# Patient Record
Sex: Female | Born: 1985 | Race: White | Hispanic: No | Marital: Single | Smoking: Current every day smoker
Health system: Southern US, Community
[De-identification: ages and names within clinical notes are randomized; demographics above are authoritative.]

## PROBLEM LIST (undated history)

## (undated) ENCOUNTER — Emergency Department (HOSPITAL_COMMUNITY): Discharge status: Against Medical Advice | Payer: Medicaid Other

## (undated) DIAGNOSIS — A499 Bacterial infection, unspecified: Secondary | ICD-10-CM

## (undated) DIAGNOSIS — IMO0002 Reserved for concepts with insufficient information to code with codable children: Secondary | ICD-10-CM

## (undated) DIAGNOSIS — G930 Cerebral cysts: Secondary | ICD-10-CM

## (undated) DIAGNOSIS — R011 Cardiac murmur, unspecified: Secondary | ICD-10-CM

## (undated) DIAGNOSIS — A630 Anogenital (venereal) warts: Secondary | ICD-10-CM

## (undated) DIAGNOSIS — N390 Urinary tract infection, site not specified: Secondary | ICD-10-CM

## (undated) DIAGNOSIS — A5901 Trichomonal vulvovaginitis: Secondary | ICD-10-CM

## (undated) DIAGNOSIS — F32A Depression, unspecified: Secondary | ICD-10-CM

## (undated) DIAGNOSIS — N870 Mild cervical dysplasia: Secondary | ICD-10-CM

## (undated) DIAGNOSIS — Z8619 Personal history of other infectious and parasitic diseases: Secondary | ICD-10-CM

## (undated) DIAGNOSIS — F1010 Alcohol abuse, uncomplicated: Secondary | ICD-10-CM

## (undated) DIAGNOSIS — F329 Major depressive disorder, single episode, unspecified: Secondary | ICD-10-CM

## (undated) DIAGNOSIS — K219 Gastro-esophageal reflux disease without esophagitis: Secondary | ICD-10-CM

## (undated) HISTORY — DX: Personal history of other infectious and parasitic diseases: Z86.19

## (undated) HISTORY — PX: APPENDECTOMY: SHX54

## (undated) HISTORY — DX: Mild cervical dysplasia: N87.0

## (undated) HISTORY — PX: SKIN GRAFT: SHX250

## (undated) HISTORY — DX: Reserved for concepts with insufficient information to code with codable children: IMO0002

## (undated) HISTORY — DX: Bacterial infection, unspecified: A49.9

## (undated) HISTORY — DX: Anogenital (venereal) warts: A63.0

---
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
## 2006-04-03 ENCOUNTER — Inpatient Hospital Stay (HOSPITAL_COMMUNITY): Admission: AD | Admit: 2006-04-03 | Discharge: 2006-04-08 | Payer: Self-pay | Admitting: Obstetrics and Gynecology

## 2006-04-05 ENCOUNTER — Encounter (INDEPENDENT_AMBULATORY_CARE_PROVIDER_SITE_OTHER): Payer: Self-pay | Admitting: Specialist

## 2006-07-31 ENCOUNTER — Ambulatory Visit (HOSPITAL_COMMUNITY): Admission: RE | Admit: 2006-07-31 | Discharge: 2006-07-31 | Payer: Self-pay | Admitting: Neurosurgery

## 2007-01-24 ENCOUNTER — Emergency Department (HOSPITAL_COMMUNITY): Admission: EM | Admit: 2007-01-24 | Discharge: 2007-01-24 | Payer: Self-pay | Admitting: *Deleted

## 2007-06-24 ENCOUNTER — Emergency Department (HOSPITAL_COMMUNITY): Admission: EM | Admit: 2007-06-24 | Discharge: 2007-06-24 | Payer: Self-pay | Admitting: Emergency Medicine

## 2007-10-13 ENCOUNTER — Emergency Department (HOSPITAL_COMMUNITY): Admission: EM | Admit: 2007-10-13 | Discharge: 2007-10-13 | Payer: Self-pay | Admitting: Emergency Medicine

## 2008-02-18 ENCOUNTER — Emergency Department (HOSPITAL_COMMUNITY): Admission: EM | Admit: 2008-02-18 | Discharge: 2008-02-18 | Payer: Self-pay | Admitting: Emergency Medicine

## 2008-12-10 ENCOUNTER — Emergency Department (HOSPITAL_COMMUNITY): Admission: EM | Admit: 2008-12-10 | Discharge: 2008-12-10 | Payer: Self-pay | Admitting: Emergency Medicine

## 2009-02-26 ENCOUNTER — Emergency Department (HOSPITAL_COMMUNITY): Admission: EM | Admit: 2009-02-26 | Discharge: 2009-02-26 | Payer: Self-pay | Admitting: Emergency Medicine

## 2009-02-28 ENCOUNTER — Emergency Department (HOSPITAL_COMMUNITY): Admission: EM | Admit: 2009-02-28 | Discharge: 2009-02-28 | Payer: Self-pay | Admitting: Emergency Medicine

## 2009-07-28 ENCOUNTER — Emergency Department (HOSPITAL_COMMUNITY): Admission: EM | Admit: 2009-07-28 | Discharge: 2009-07-28 | Payer: Self-pay | Admitting: Emergency Medicine

## 2009-09-11 ENCOUNTER — Emergency Department (HOSPITAL_COMMUNITY): Admission: EM | Admit: 2009-09-11 | Discharge: 2009-09-11 | Payer: Self-pay | Admitting: Family Medicine

## 2010-03-06 ENCOUNTER — Inpatient Hospital Stay (HOSPITAL_COMMUNITY)
Admission: RE | Admit: 2010-03-06 | Discharge: 2010-03-06 | Disposition: A | Discharge status: Home / Self Care | Payer: Medicaid Other | Source: Ambulatory Visit | Attending: Emergency Medicine | Admitting: Emergency Medicine

## 2010-04-14 LAB — POCT URINALYSIS DIPSTICK
Bilirubin Urine: NEGATIVE
Specific Gravity, Urine: >=1.03 (ref 1.005–1.030)
pH: 7 (ref 5.0–8.0)

## 2010-05-03 LAB — POCT URINALYSIS DIP (DEVICE)
Specific Gravity, Urine: 1.02 (ref 1.005–1.030)
pH: 6.5 (ref 5.0–8.0)

## 2010-05-03 LAB — URINE CULTURE: Colony Count: >=100000

## 2010-05-15 LAB — URINALYSIS, ROUTINE W REFLEX MICROSCOPIC
Nitrite: NEGATIVE
Protein, ur: 100 mg/dL — AB
Urobilinogen, UA: 1 mg/dL (ref 0.0–1.0)

## 2010-05-15 LAB — CBC
MCHC: 34.5 g/dL (ref 30.0–36.0)
RBC: 3.52 MIL/uL — ABNORMAL LOW (ref 3.87–5.11)
RDW: 12.9 % (ref 11.5–15.5)

## 2010-05-15 LAB — DIFFERENTIAL
Basophils Absolute: 0 10*3/uL (ref 0.0–0.1)
Basophils Relative: 0 % (ref 0–1)
Monocytes Relative: 2 % — ABNORMAL LOW (ref 3–12)
Neutro Abs: 12.4 10*3/uL — ABNORMAL HIGH (ref 1.7–7.7)
Neutrophils Relative %: 91 % — ABNORMAL HIGH (ref 43–77)

## 2010-05-15 LAB — URINE MICROSCOPIC-ADD ON

## 2010-05-15 LAB — WET PREP, GENITAL
Trich, Wet Prep: NONE SEEN
Yeast Wet Prep HPF POC: NONE SEEN

## 2010-05-15 LAB — BASIC METABOLIC PANEL
CO2: 26 mEq/L (ref 19–32)
Calcium: 8.6 mg/dL (ref 8.4–10.5)
Creatinine, Ser: 0.58 mg/dL (ref 0.4–1.2)
GFR calc Af Amer: >60 mL/min (ref >60)

## 2010-05-15 LAB — PREGNANCY, URINE: Preg Test, Ur: NEGATIVE

## 2010-06-16 NOTE — Discharge Summary (Signed)
NAME:  Suzanne [name], Suzanne [name]           ACCOUNT NO.:  [id number]   MEDICAL RECORD NO.:  [id number]          PATIENT TYPE:  INP   LOCATION:  9122                          FACILITY:  [facility]   PHYSICIAN:  Crist Fat. Rivard, M.D. DATE OF BIRTH:  06-10-1985   DATE OF ADMISSION:  04/03/2006  DATE OF DISCHARGE:  04/08/2006                               DISCHARGE SUMMARY   ADMITTING DIAGNOSES:  1. Intrauterine pregnancy at 14 and six-sevenths weeks.  2. Proteinuria.  3. Marginal cord insertion.  4. Group B streptococcus positive.   Dictation ended at this point.      Renaldo Reel Emilee Hero, C.N.M.      Crist Fat Rivard, M.D.  Electronically Signed    VLL/MEDQ  D:  04/08/2006  T:  04/08/2006  Job:  [id number]

## 2010-06-16 NOTE — Op Note (Signed)
NAME:  Suzanne, [name] NO.:  [id number]   MEDICAL RECORD NO.:  [id number]          PATIENT TYPE:  INP   LOCATION:  9164                          FACILITY:  [facility]   PHYSICIAN:  Janine Limbo, M.D.DATE OF BIRTH:  07/29/1985   DATE OF PROCEDURE:  04/05/2006  DATE OF DISCHARGE:                               OPERATIVE REPORT   PREOPERATIVE DIAGNOSES:  1. Term intrauterine pregnancy.  2. Preeclampsia.  3. Nonreassuring fetal heart rate tracing.  4. Failure to descend in labor.   POSTOPERATIVE DIAGNOSES:  1. Term intrauterine pregnancy.  2. Preeclampsia.  3. Nonreassuring fetal heart rate tracing.  4. Failure to descend in labor.  5. Persistent occiput posterior presentation.   PROCEDURES:  1. Unsuccessful vacuum extraction vaginal delivery.  2. Primary low transverse cesarean section.   SURGEON:  Leonard Schwartz, M.D.   FIRST ASSISTANT:  Marie L. Williams, C.N.M.   SECOND ASSISTANT:  Chip Boer L. Emilee Hero, C.N.M.   ANESTHETIC:  Epidural.   DISPOSITION:  Suzanne [name] is a 25 year old female, gravida 1, para 0,  who was admitted to the [facility] on 04/03/2006  because of preeclampsia.  The patient was given Cytotec in preparation  of induction of labor.  Magnesium was started on 04/04/2006 for seizure  prophylaxis.  Pitocin was started on 04/04/2006.  The patient had her  membranes ruptured on 04/04/2006.  She continued to progress during her  labor.  She did become completely dilated.  The fetal head was at a +3  station.  The cervix was completely effaced.  The patient was allowed to  push.  As she pushed, the fetal heart  began having variable  decelerations, some of which became deep variable decelerations.  The  patient maintained good short-term variability.  She also continued to  have accelerations.  Options for management were reviewed with the  patient, which included observation only, continued pushing, operative  vaginal  delivery, and primary cesarean section.  We discussed the risks  and benefits of each of those options.  The patient elected to proceed  with vacuum extraction vaginal delivery.  The specific risk associated  with vacuum extraction vaginal delivery were reviewed, including caput  formation, hematoma formation, the rare risk of intracranial bleeding,  and the possibility that the vacuum extraction would be unsuccessful and  that we would need to proceed with cesarean section.  After all the  patient's questions were answered, she was ready to proceed.  We applied  the Kiwi vacuum extractor and the patient was allowed to push.  The  suction was applied a total of four times.  One popoff did occur.  The  fetal head did not descend.  However, the fetal heart rate tracing began  to improve and the deep variable decelerations resolved.  The patient  wanted to push longer and therefore she was allowed to push because we  were comfortable with the fetal heart rate tracing.  The patient pushed  for 1-1/2 hours.  No significant descent was noted.  At this point, the  patient was ready to proceed with cesarean delivery.  We reviewed  the  risk of cesarean section, which include, but are not limited to,  anesthetic complications, bleeding, infection, and possible damage to  surrounding organs.   FINDINGS:  A 6 pound 15 ounce female infant (Ava) was delivered from an  occiput posterior presentation.  The Apgars were 8 at 1 minute and 10 at  5 minutes.  The uterus, fallopian tubes, and the ovaries were normal for  the gravid state.  The placenta appeared normal.  The cord blood pH was  7.26.   PROCEDURE:  After the unsuccessful attempt at vacuum extraction and  after the patient began to tire from pushing, we proceeded to the  operating room.  A Foley catheter was placed in the bladder in the labor  and delivery suite.  It was then determined that the patient's epidural  that was used for labor would  be adequate for cesarean section.  The  patient's abdomen was prepped with multiple layers of Betadine and then  sterilely draped.  The lower abdomen was injected with 10 mL of half  percent Marcaine with epinephrine.  A low transverse incision was made  in the abdomen and carried sharply through the subcutaneous tissue,  fascia, and the anterior peritoneum.  The bladder flap was developed.  An incision was made in the lower uterine segment and extended in a low  transverse fashion.  The fetal head was delivered from an occiput  posterior presentation.  A nuchal cord was noted.  The nuchal cord was  reduced prior to delivery.  The mouth and nose were suctioned.  The  remainder of the infant was then delivered.  The cord was clamped and  cut and the infant was handed to the waiting pediatric team.  Routine  cord blood studies were obtained.  The placenta was removed.  The  uterine cavity was cleaned of amniotic fluid, clotted blood, and  membranes.  The uterine incision was closed using a running locking  suture of 2-0 Vicryl followed by an imbricating suture of 2-0 Vicryl.  The bladder flap was irrigated.  Hemostasis was adequate.  The pelvis  was then vigorously irrigated.  The anterior peritoneum was  reapproximated in the midline using 2-0 Vicryl.  The abdominal  musculature was then reapproximated in the midline using 2-0 Vicryl.  The subcutaneous layer, fascia, and the muscles were irrigated.  Hemostasis was adequate.  The fascia was closed using a running suture  of 0-Vicryl followed by three interrupted sutures of 0-Vicryl.  The  subcutaneous layer was closed using interrupted sutures of 2-0 Vicryl.  The skin was reapproximated using 3-0 Monocryl.  Sponge, needle,  instrument counts were correct on two occasions.  The estimated blood  loss for the procedure was 800 mL.  The patient tolerated her procedure well.  She was awakened from her anesthetic and taken to the recovery  room  in stable condition.  The infant was taken to the full-term nursery  in stable condition.  The patient will be started back on her magnesium  and she will go to the adult intensive care unit after her time in the  recovery room.      Janine Limbo, M.D.  Electronically Signed     AVS/MEDQ  D:  04/05/2006  T:  04/05/2006  Job:  [id number]
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
## 2010-06-16 NOTE — Discharge Summary (Signed)
NAME:  Suzanne [name], Suzanne [name]           ACCOUNT NO.:  [id number]   MEDICAL RECORD NO.:  [id number]          PATIENT TYPE:  INP   LOCATION:  9122                          [facility]ACILITY[facility]   PHYSICIAN:  Crist Fat. Rivard, M.D. DATE OF BIRTH:  [birth year]   DATE OF ADMISSION:  04/03/2006  DATE OF DISCHARGE:  04/08/2006                               DISCHARGE SUMMARY   ADMISSION DIAGNOSES:  1. Intrauterine pregnancy at 38-6/7 weeks.  2. Proteinuria.  3. Marginal cord insertion.  4. Positive group B Streptococcus.   DISCHARGE DIAGNOSES:  1. Term pregnancy.  2. Failure to descend.  3. Preeclampsia.  4. Nonreassuring fetal heart rate.  5. Persistent occipitoposterior presentation.   PROCEDURES:  1. Primary low transverse cesarean section.  2. Unsuccessful vacuum extraction vaginal delivery attempt.  3. Epidural anesthesia.   HOSPITAL COURSE:  Suzanne [name] is a 25 year old, gravida 2, para 0-0-1-0,  at 38-6/7 weeks who was admitted on April 03, 2006, for induction  secondary to 3+ proteinuria and headaches.  Her blood pressure was  126/85.  Her pregnancy had been remarkable for #1 smoker, #2 patient  with an arachnoid cyst on her brain, #3 positive group B strep, #4  marginal cord insertion, #5 preeclampsia.  The patient was admitted.  Cervix was closed, 50%, vertex, -3.  PIH labs done were normal.  Cervidil was placed.  Magnesium sulfate was recommended and was begun  the next morning. Pitocin was also begun the next morning.  As the day  progressed, artificial rupture of membranes was accomplished at 3:30  p.m. with Dr. Stefano Gaul with clear fluid noted.  Cervix at that time was  2, 75%, vertex, -2.  The patient progressed to fully dilated by  approximately 4:40 a.m.  She pushed for approximately 1-1/2 hours.  Vacuum assistance was attempted x4 pulls with one pop off without  further progress.  Variable decelerations were noted but had improved by  that time.  Options were reviewed with the  patient with Dr. Stefano Gaul,  and the patient elected to proceed with cesarean section.  The patient  was taken to the operating room where primary low transverse cesarean  section was performed by Dr. Stefano Gaul under existing epidural  anesthesia.  Findings were a viable female by the name of Suzanne [name], weight 6  pounds 15 ounces, Apgars 8/10.  Normal cord pH was noted at 7.26.  The  patient tolerated the procedure well and was taken to recovery in good  condition.  Infant was taken to the full-term nursery.  Later the same  day, the patient was in the intensive care unit on magnesium.  Her  vision was slightly blurred but did not have any headaches.  Blood  pressure was in the 110-120/60-70.  There was good urine output.  Her  extremity reflexes were 2-3+ with no clonus.   By the next morning, the patient was doing well.  She was bottle  feeding.  She denied any PIH symptoms.  Blood pressure was 112/67.  Physical exam was within normal limits.  Incision was clean, dry, and  intact with subcuticular sutures noted.  Hemoglobin 9.8, platelet  count  232.  Comprehensive metabolic panel was normal.  Magnesium was 4.9.  Dr.  Su Hilt saw the patient early that afternoon and noted the patient was  diuresing well.  Magnesium sulfate was discontinued at that time, and  the patient was later transferred to mother-baby.   By postop day #2, she continued to do well.  Blood pressure was normal.  The patient was placed on daily weights with weight on postop day #2 at  142.   By postop day #3, the patient was doing well.  She had a low-grade  temperature of 100 at 5:50 a.m.; however, this resolved itself.  Later  that morning, her blood pressure was 121/73.  Weight was 136.6, down  from 142 the day before.  Her fundus was firm.  Her incisions clean,  dry, and intact with subcuticular sutures noted.  The patient was out of  bed without any dizziness or syncope.  She was observed for a while that  morning,  then was noted to have no fever and was feeling well.  She was  deemed to have received full benefit of her hospital stay and was  discharged home.   CONDITION ON DISCHARGE:  Stable.   DISCHARGE MEDICATIONS:  1. Motrin 600 mg p.o. q. 6 h p.r.n. pain.  2. Tylox 1-2 p.o. q. 3-4 h p.r.n. pain.   FOLLOWUP:  Discharge followup will occur in 6 weeks at [facility] with smart start nurses planning to see the patient over the next  week.      Suzanne [name], C.N.M.      Crist Fat Rivard, M.D.  Electronically Signed    VLL/MEDQ  D:  04/08/2006  T:  04/08/2006  Job:  [id number]

## 2010-11-01 LAB — POCT URINALYSIS DIP (DEVICE)
Nitrite: NEGATIVE
Protein, ur: >=300 — AB
pH: 7.5

## 2010-11-01 LAB — POCT PREGNANCY, URINE: Preg Test, Ur: NEGATIVE

## 2010-12-27 ENCOUNTER — Emergency Department (INDEPENDENT_AMBULATORY_CARE_PROVIDER_SITE_OTHER)
Admission: EM | Admit: 2010-12-27 | Discharge: 2010-12-27 | Disposition: A | Discharge status: Home / Self Care | Payer: Medicaid Other | Source: Home / Self Care | Attending: Emergency Medicine | Admitting: Emergency Medicine

## 2010-12-27 DIAGNOSIS — N390 Urinary tract infection, site not specified: Secondary | ICD-10-CM

## 2010-12-27 HISTORY — DX: Urinary tract infection, site not specified: N39.0

## 2010-12-27 HISTORY — DX: Trichomonal vulvovaginitis: A59.01

## 2010-12-27 LAB — POCT URINALYSIS DIP (DEVICE)
Protein, ur: >=300 mg/dL — AB
Specific Gravity, Urine: >=1.03 (ref 1.005–1.030)
Urobilinogen, UA: 1 mg/dL (ref 0.0–1.0)
pH: 6.5 (ref 5.0–8.0)

## 2010-12-27 LAB — POCT PREGNANCY, URINE: Preg Test, Ur: NEGATIVE

## 2010-12-27 LAB — WET PREP, GENITAL

## 2010-12-27 MED ORDER — PHENAZOPYRIDINE HCL 200 MG PO TABS: 200.0000 mg | ORAL_TABLET | Freq: Three times a day (TID) | ORAL | Status: AC | PRN

## 2010-12-27 MED ORDER — IBUPROFEN 600 MG PO TABS: 600.0000 mg | ORAL_TABLET | Freq: Four times a day (QID) | ORAL | Status: AC | PRN

## 2010-12-27 MED ORDER — HYDROCODONE-ACETAMINOPHEN 5-325 MG PO TABS: 2.0000 | ORAL_TABLET | Freq: Four times a day (QID) | ORAL | Status: AC | PRN

## 2010-12-27 MED ORDER — SULFAMETHOXAZOLE-TRIMETHOPRIM 800-160 MG PO TABS: 1.0000 | ORAL_TABLET | Freq: Two times a day (BID) | ORAL | Status: AC

## 2010-12-27 NOTE — ED Provider Notes (Signed)
History     CSN: [id number] Arrival date & time: 12/27/2010 11:14 AM   First MD Initiated Contact with Patient 12/27/10 1006      Chief Complaint  Patient presents with  . Abdominal Pain    (Consider location/radiation/quality/duration/timing/severity/associated sxs/prior treatment) HPI Comments: Pt with sharp LLQ pain x 2 days lasts hours and resolves- not affected with movement, BM, eating, fasting, urination. Reports oderous cloudy urine, no urgency, frequency, dysuria, hematuria. Reports pain now rad to back and with bodyaches, malaise. Low grade fevers tmax 99. Also with some vag d/c but pt states is unchanged from baseline. No vag itching or pain, genital rash, other rash. Sexually active with longterm female partner who is asxatic. Does not use barrier protection. STD's not a concern today. H/o trich, uti's. No h/o BV, yeast infxn, GC, chlamydia, herpes, syphilis, HIV.   Patient is a 25 y.o. female presenting with abdominal pain. The history is provided by the patient.  Abdominal Pain The primary symptoms of the illness include abdominal pain, nausea and vaginal discharge. The primary symptoms of the illness do not include fever, vomiting, diarrhea, dysuria or vaginal bleeding. The current episode started 2 days ago. The problem has been gradually worsening.  The vaginal discharge is not associated with dysuria.  The patient states that she believes she is currently not pregnant. The patient has not had a change in bowel habit. Additional symptoms associated with the illness include chills and back pain. Symptoms associated with the illness do not include anorexia, heartburn, constipation, urgency, hematuria or frequency.    Past Medical History  Diagnosis Date  . UTI (urinary tract infection)   . Vaginal trichomoniasis     Past Surgical History  Procedure Date  . Skin graft     History reviewed. No pertinent family history.  History  Substance Use Topics  . Smoking  status: Current Everyday Smoker -- 0.5 packs/day  . Smokeless tobacco: Not on file  . Alcohol Use: Yes    OB History    Grav Para Term Preterm Abortions TAB SAB Ect Mult Living                  Review of Systems  Constitutional: Positive for chills. Negative for fever.  Gastrointestinal: Positive for nausea and abdominal pain. Negative for heartburn, vomiting, diarrhea, constipation, blood in stool and anorexia.  Genitourinary: Positive for flank pain and vaginal discharge. Negative for dysuria, urgency, frequency, hematuria, decreased urine volume, vaginal bleeding, genital sores, vaginal pain and pelvic pain.  Musculoskeletal: Positive for back pain.  Skin: Negative for rash.  Neurological: Negative for weakness.    Allergies  Review of patient's allergies indicates no known allergies.  Home Medications   Current Outpatient Rx  Name Route Sig Dispense Refill  . IBUPROFEN 600 MG PO TABS Oral Take 600 mg by mouth every 6 (six) hours as needed.      Marland Kitchen HYDROCODONE-ACETAMINOPHEN 5-325 MG PO TABS Oral Take 2 tablets by mouth every 6 (six) hours as needed for pain. 20 tablet 0  . IBUPROFEN 600 MG PO TABS Oral Take 1 tablet (600 mg total) by mouth every 6 (six) hours as needed for pain. 30 tablet 0  . PHENAZOPYRIDINE HCL 200 MG PO TABS Oral Take 1 tablet (200 mg total) by mouth 3 (three) times daily as needed for pain. 6 tablet 0  . SULFAMETHOXAZOLE-TRIMETHOPRIM 800-160 MG PO TABS Oral Take 1 tablet by mouth 2 (two) times daily. X 7 days 14 tablet 0  BP 117/72  Pulse 98  Temp(Src) 98 F (36.7 C) (Oral)  Resp 16  SpO2 100%  LMP 11/18/2010  Physical Exam  Nursing note and vitals reviewed. Constitutional: She is oriented to person, place, and time. She appears well-developed and well-nourished. No distress.  HENT:  Head: Normocephalic and atraumatic.  Eyes: EOM are normal. Pupils are equal, round, and reactive to light.  Neck: Normal range of motion. Neck supple.    Cardiovascular: Normal rate, regular rhythm and normal heart sounds.   Pulmonary/Chest: Effort normal and breath sounds normal.  Abdominal: Soft. Bowel sounds are normal. She exhibits no distension. There is tenderness in the suprapubic area. There is CVA tenderness. There is no rebound and no guarding.       Left flank pain  Genitourinary: Uterus normal. Pelvic exam was performed with patient prone. There is no rash on the right labia. There is no rash on the left labia. Uterus is not tender. Cervix exhibits discharge. Cervix exhibits no motion tenderness and no friability. Right adnexum displays no mass, no tenderness and no fullness. Left adnexum displays no mass, no tenderness and no fullness. No erythema, tenderness or bleeding around the vagina. No foreign body around the vagina. Vaginal discharge found.       Yellowish nonoderous  vaginal d/c.Chaperone present during exam  Musculoskeletal: Normal range of motion.  Neurological: She is alert and oriented to person, place, and time.  Skin: Skin is warm and dry.  Psychiatric: She has a normal mood and affect. Her behavior is normal. Judgment and thought content normal.    ED Course  Procedures (including critical care time)  Labs Reviewed  POCT URINALYSIS DIP (DEVICE) - Abnormal; Notable for the following:    Bilirubin Urine MODERATE (*)    Ketones, ur >=160 (*)    Hgb urine dipstick MODERATE (*)    Protein, ur >=300 (*)    Nitrite POSITIVE (*)    Leukocytes, UA LARGE (*) Biochemical Testing Only. Please order routine urinalysis from main lab if confirmatory testing is needed.   All other components within normal limits  POCT PREGNANCY, URINE  URINE CULTURE   No results found.   1. UTI (lower urinary tract infection)       MDM  H&P most c/w UTI/early pyelo. Does have yellowish vag d/c. Sent off GC/chlamydia, wet prep. Will not treat empirically now per pt reques. Will send home with /abx for uti. Advised pt to refrain from  sexual contact until sheknows lab results, symptoms resolve, and partner(s) are treated. Pt provided working phone number. Advised to call here in 5 days if pt does not hear from [place]. Pt agrees.     Luiz Blare, MD 12/27/10 1247

## 2010-12-27 NOTE — ED Notes (Signed)
C/o she has been having pain low left flank, abdominal pain ,ear ache, since Monday morning; has been nauseated, w/o vomiting; vaginal d/c changes; using aleve, motrin w minimal relief

## 2010-12-28 LAB — URINE CULTURE: Colony Count: 45000

## 2010-12-28 LAB — GC/CHLAMYDIA PROBE AMP, GENITAL
Chlamydia, DNA Probe: NEGATIVE
GC Probe Amp, Genital: NEGATIVE

## 2010-12-28 NOTE — ED Notes (Signed)
Labs and meds reviewed. Message sent to Dr. Chaney Malling to ask if any further tx. needed. Vassie Moselle  12/28/2010

## 2011-01-03 NOTE — ED Notes (Addendum)
11/30 Dr. Chaney Malling wrote back -no if appropriately treated. Suzanne [name] 01/03/2011

## 2011-02-08 ENCOUNTER — Encounter (HOSPITAL_COMMUNITY): Payer: Self-pay | Admitting: Emergency Medicine

## 2011-02-08 ENCOUNTER — Emergency Department (INDEPENDENT_AMBULATORY_CARE_PROVIDER_SITE_OTHER)
Admission: EM | Admit: 2011-02-08 | Discharge: 2011-02-08 | Disposition: A | Discharge status: Home / Self Care | Payer: Medicaid Other | Source: Home / Self Care | Attending: Family Medicine | Admitting: Family Medicine

## 2011-02-08 DIAGNOSIS — H6090 Unspecified otitis externa, unspecified ear: Secondary | ICD-10-CM

## 2011-02-08 DIAGNOSIS — H60399 Other infective otitis externa, unspecified ear: Secondary | ICD-10-CM

## 2011-02-08 DIAGNOSIS — H6690 Otitis media, unspecified, unspecified ear: Secondary | ICD-10-CM

## 2011-02-08 MED ORDER — DOXYCYCLINE HYCLATE 100 MG PO CAPS: 100.0000 mg | ORAL_CAPSULE | Freq: Two times a day (BID) | ORAL | Status: AC

## 2011-02-08 MED ORDER — NEOMYCIN-POLYMYXIN-HC 3.5-10000-1 OT SUSP: 3.0000 [drp] | Freq: Three times a day (TID) | OTIC | Status: AC

## 2011-02-08 NOTE — ED Notes (Signed)
Given warm blankets, pillow and sprite

## 2011-02-08 NOTE — ED Provider Notes (Signed)
History     CSN: [id number]  Arrival date & time 02/08/11  9147   First MD Initiated Contact with Patient 02/08/11 1220      Chief Complaint  Patient presents with  . Otalgia    (Consider location/radiation/quality/duration/timing/severity/associated sxs/prior treatment) HPI Comments: Right ear pain with discharge x 3 days. Noted scabs in eac. No runny nose, no fever. States hearing is slightly decreased. No other symptoms  The history is provided by the patient.    Past Medical History  Diagnosis Date  . UTI (urinary tract infection)   . Vaginal trichomoniasis     Past Surgical History  Procedure Date  . Skin graft     No family history on file.  History  Substance Use Topics  . Smoking status: Current Everyday Smoker -- 0.5 packs/day  . Smokeless tobacco: Not on file  . Alcohol Use: Yes    OB History    Grav Para Term Preterm Abortions TAB SAB Ect Mult Living                  Review of Systems  Constitutional: Negative.   HENT: Positive for hearing loss, ear pain and ear discharge.   Respiratory: Negative.   Cardiovascular: Negative.   Gastrointestinal: Negative.   Genitourinary: Negative.     Allergies  Review of patient's allergies indicates no known allergies.  Home Medications   Current Outpatient Rx  Name Route Sig Dispense Refill  . DOXYCYCLINE HYCLATE 100 MG PO CAPS Oral Take 1 capsule (100 mg total) by mouth 2 (two) times daily. 20 capsule 0  . IBUPROFEN 600 MG PO TABS Oral Take 600 mg by mouth every 6 (six) hours as needed.      . NEOMYCIN-POLYMYXIN-HC 3.5-10000-1 OT SUSP Right Ear Place 3 drops into the right ear 3 (three) times daily. 7.5 mL 0    BP 121/67  Pulse 89  Temp(Src) 98.4 F (36.9 C) (Oral)  Resp 20  SpO2 100%  LMP 02/06/2011  Physical Exam  Nursing note and vitals reviewed. Constitutional: She appears well-developed and well-nourished. No distress.  HENT:  Head: Normocephalic and atraumatic.  Nose: Nose normal.       Scabbed lesion left eac. Minimal discharge. Tm dull.   Cardiovascular: Normal rate and regular rhythm.   Pulmonary/Chest: Effort normal and breath sounds normal.    ED Course  Procedures (including critical care time)  Labs Reviewed - No data to display No results found.   1. Otitis media   2. Otitis external       MDM          Randa Spike, MD 02/08/11 [phone]

## 2011-02-08 NOTE — ED Notes (Signed)
Onset 3 days ago of right ear drainage, difficulty hearing for several months .  Also, rash to trunk and extremities.  Initially located on trunk of body.  Marland Kitchen

## 2011-02-23 ENCOUNTER — Encounter (HOSPITAL_COMMUNITY): Payer: Self-pay

## 2011-02-23 ENCOUNTER — Emergency Department (HOSPITAL_COMMUNITY)
Admission: EM | Admit: 2011-02-23 | Discharge: 2011-02-23 | Disposition: A | Discharge status: Home / Self Care | Payer: Medicaid Other | Source: Home / Self Care

## 2011-02-23 DIAGNOSIS — B86 Scabies: Secondary | ICD-10-CM

## 2011-02-23 MED ORDER — PERMETHRIN 5 % EX CREA: TOPICAL_CREAM | CUTANEOUS | Status: AC

## 2011-02-23 NOTE — ED Provider Notes (Signed)
History     CSN: [id number]  Arrival date & time 02/23/11  4098   None     Chief Complaint  Patient presents with  . Rash    (Consider location/radiation/quality/duration/timing/severity/associated sxs/prior treatment) HPI Comments: Patient presents with complaints of an itchy rash for one month. Rash started on her abdomen and has spread to her entire body including her hands armpits and groin area. She has tried over-the-counter hydrocortisone cream without improvement. The itching worsens after a hot shower and at bedtime. She has no known contacts. She denies any new lotions, soaps, laundry detergents, etc.   Past Medical History  Diagnosis Date  . UTI (urinary tract infection)   . Vaginal trichomoniasis     Past Surgical History  Procedure Date  . Skin graft     No family history on file.  History  Substance Use Topics  . Smoking status: Current Everyday Smoker -- 0.5 packs/day  . Smokeless tobacco: Not on file  . Alcohol Use: Yes    OB History    Grav Para Term Preterm Abortions TAB SAB Ect Mult Living                  Review of Systems  Constitutional: Negative for fever and chills.  Skin: Positive for rash.    Allergies  Review of patient's allergies indicates no known allergies.  Home Medications   Current Outpatient Rx  Name Route Sig Dispense Refill  . IBUPROFEN 600 MG PO TABS Oral Take 600 mg by mouth every 6 (six) hours as needed.      Marland Kitchen PERMETHRIN 5 % EX CREA  Apply at bedime as directed for 8-12 hrs 60 g 0    BP 110/65  Pulse 76  Temp(Src) 97 F (36.1 C) (Oral)  Resp 16  SpO2 97%  LMP 02/08/2011  Physical Exam  Nursing note and vitals reviewed. Constitutional: She appears well-developed and well-nourished. No distress.  HENT:  Head: Normocephalic and atraumatic.  Musculoskeletal: Normal range of motion. She exhibits no edema and no tenderness.  Neurological: She is alert.  Skin: Skin is warm and dry. Rash noted.       Papular  rash, with areas of excoriations and dry patches noted scattered on thorax, hands - including web spaces. Face and neck are clear.   Psychiatric: She has a normal mood and affect.    ED Course  Procedures (including critical care time)  Labs Reviewed - No data to display No results found.   1. Scabies       MDM  Itchy papular rash, similar appearance of scabies, common distribution seen with scabies.         Melody Comas, [place] 02/23/11 [phone]

## 2011-02-23 NOTE — ED Notes (Signed)
C/o itchy rash all over for 1 month- states it comes and goes.  States it started on her abdomen.  Using cortisone cream.

## 2011-02-26 NOTE — ED Provider Notes (Signed)
Medical screening examination/treatment/procedure(s) were performed by non-physician practitioner and as supervising physician I was immediately available for consultation/collaboration.   Eliu Batch DOUGLAS MD.    Trev Boley Douglas Rodney Wigger, MD 02/26/11 1348 

## 2011-03-10 ENCOUNTER — Encounter (HOSPITAL_COMMUNITY): Payer: Self-pay | Admitting: *Deleted

## 2011-03-10 ENCOUNTER — Emergency Department (INDEPENDENT_AMBULATORY_CARE_PROVIDER_SITE_OTHER)
Admission: EM | Admit: 2011-03-10 | Discharge: 2011-03-10 | Disposition: A | Discharge status: Home / Self Care | Payer: Medicaid Other | Source: Home / Self Care | Attending: Emergency Medicine | Admitting: Emergency Medicine

## 2011-03-10 DIAGNOSIS — L259 Unspecified contact dermatitis, unspecified cause: Secondary | ICD-10-CM

## 2011-03-10 DIAGNOSIS — L309 Dermatitis, unspecified: Secondary | ICD-10-CM

## 2011-03-10 MED ORDER — TRIAMCINOLONE ACETONIDE 0.1 % EX CREA: TOPICAL_CREAM | Freq: Two times a day (BID) | CUTANEOUS | Status: AC

## 2011-03-10 MED ORDER — PREDNISONE 20 MG PO TABS: 40.0000 mg | ORAL_TABLET | Freq: Every day | ORAL | Status: AC

## 2011-03-10 MED ORDER — AQUAPHOR EX OINT: TOPICAL_OINTMENT | CUTANEOUS | Status: AC | PRN

## 2011-03-10 NOTE — ED Notes (Signed)
Treated over 1 month ago for scabies, states not getting better, now has areas on abd, back, and legs, using permethrin cream

## 2011-03-10 NOTE — ED Provider Notes (Signed)
History     CSN: [id number]  Arrival date & time 03/10/11  1031   First MD Initiated Contact with Patient 03/10/11 1151      Chief Complaint  Patient presents with  . Rash    (Consider location/radiation/quality/duration/timing/severity/associated sxs/prior treatment) HPI Comments: Patient returns urgent care after being treated for scabies with permethrin cream completed treatment about 2 weeks ago patient describes that the rash is getting much worse and patches are bigger mainly on her hands and forearms.  Patient is a 26 y.o. female presenting with rash. The history is provided by the patient.  Rash  This is a chronic problem. The problem has not changed since onset.There has been no fever. The rash is present on the torso, back, trunk, right arm, left arm, left hand and right hand.    Past Medical History  Diagnosis Date  . UTI (urinary tract infection)   . Vaginal trichomoniasis     Past Surgical History  Procedure Date  . Skin graft     History reviewed. No pertinent family history.  History  Substance Use Topics  . Smoking status: Current Everyday Smoker -- 0.5 packs/day  . Smokeless tobacco: Not on file  . Alcohol Use: Yes    OB History    Grav Para Term Preterm Abortions TAB SAB Ect Mult Living                  Review of Systems  Skin: Positive for rash.    Allergies  Review of patient's allergies indicates no known allergies.  Home Medications   Current Outpatient Rx  Name Route Sig Dispense Refill  . IBUPROFEN 600 MG PO TABS Oral Take 600 mg by mouth every 6 (six) hours as needed.      Marland Kitchen AQUAPHOR EX OINT Topical Apply topically as needed for dry skin. Apply, after showers 420 g 0  . PREDNISONE 20 MG PO TABS Oral Take 2 tablets (40 mg total) by mouth daily. 2 tablets daily for 5 days 10 tablet 0  . TRIAMCINOLONE ACETONIDE 0.1 % EX CREA Topical Apply topically 2 (two) times daily. Apply bid x affected areas except face for 2 weeks 82.5 g 0     BP 114/78  Pulse 85  Temp(Src) 98.1 F (36.7 C) (Oral)  Resp 18  SpO2 98%  LMP 03/08/2011  Physical Exam  Constitutional: She appears well-developed and well-nourished. No distress.  Skin: Rash noted. There is erythema.       ED Course  Procedures (including critical care time)  Labs Reviewed - No data to display No results found.   1. Dermatitis       MDM   Patient surgeon care here with exacerbation of her rash not getting any better patches are much bigger on both of her hands wrists abdomen back pruritic in nature Lafontant scaly material and erythema he (appears not to be infectious in nature and with no papular character to it) patient relatives at home with no skin disorders symptoms or itchiness.    At this point rash does not fit criteria for scabies character not consistent with scabies considering other alternative diagnosis numular eczema among or psoriatic patches have recommended patient to follow up dermatologist and have prescribed topical and a systemic course of prednisone percent hematocrit management.       Jimmie Molly, MD 03/10/11 1504

## 2011-09-10 ENCOUNTER — Ambulatory Visit (INDEPENDENT_AMBULATORY_CARE_PROVIDER_SITE_OTHER): Payer: Medicaid Other | Admitting: Obstetrics and Gynecology

## 2011-09-10 ENCOUNTER — Encounter: Payer: Self-pay | Admitting: Obstetrics and Gynecology

## 2011-09-10 VITALS — BP 114/60 | Wt 128.0 lb

## 2011-09-10 DIAGNOSIS — N949 Unspecified condition associated with female genital organs and menstrual cycle: Secondary | ICD-10-CM

## 2011-09-10 DIAGNOSIS — Z113 Encounter for screening for infections with a predominantly sexual mode of transmission: Secondary | ICD-10-CM

## 2011-09-10 DIAGNOSIS — N898 Other specified noninflammatory disorders of vagina: Secondary | ICD-10-CM

## 2011-09-10 DIAGNOSIS — A499 Bacterial infection, unspecified: Secondary | ICD-10-CM

## 2011-09-10 DIAGNOSIS — N760 Acute vaginitis: Secondary | ICD-10-CM

## 2011-09-10 NOTE — Progress Notes (Signed)
Color: clear Odor: yes Itching:yes Thin:yes Thick:no Fever:no Dyspareunia:no Hx PID:no HX STD:yes Pelvic Pain:no Desires Gc/CT:yes Desires HIV,RPR,HbsAG:yes add HSV I II  HISTORY OF PRESENT ILLNESS  [name] is a 26 y.o. year old female,No obstetric history on file., who presents for a problem visit. The patient has a history of HSV 1 and 2.  Subjective:  The patient complains of vaginal odor and discharge.  Objective:  BP 114/60  Wt 128 lb (58.06 kg)  LMP 08/30/2011   General: alert and no distress GI: soft and nontender  External genitalia: normal general appearance Vaginal: normal without tenderness, induration or masses and relaxation noted, white discharge. Cervix: normal appearance Adnexa: normal bimanual exam Uterus: upper limits normal size, nontender  Wet prep: Positive whiff, pH 5.5, clue cells, questionable yeast  Assessment:  Bacterial vaginosis Yeast vaginitis Rule out STDs  Plan:  GC, chlamydia, HIV, RPR, HBsAg, hepatitis C sent  Metronidazole 500 mg twice each day for 7 days  Diflucan 150 mg x1  Return to office prn if symptoms worsen or fail to improve.  Leonard Schwartz M.D.  09/11/2011 8:07 AM

## 2011-09-11 ENCOUNTER — Telehealth: Payer: Self-pay | Admitting: Obstetrics and Gynecology

## 2011-09-11 LAB — GC/CHLAMYDIA PROBE AMP, GENITAL: Chlamydia, DNA Probe: POSITIVE — AB

## 2011-09-11 LAB — RPR: RPR Ser Ql: REACTIVE — AB

## 2011-09-11 LAB — HSV 2 ANTIBODY, IGG: HSV 2 Glycoprotein G Ab, IgG: <0.1 IV

## 2011-09-11 LAB — HIV ANTIBODY (ROUTINE TESTING W REFLEX): HIV: NONREACTIVE

## 2011-09-11 LAB — HSV 1 ANTIBODY, IGG: HSV 1 Glycoprotein G Ab, IgG: 3.04 IV — ABNORMAL HIGH

## 2011-09-11 MED ORDER — METRONIDAZOLE 500 MG PO TABS: 500.0000 mg | ORAL_TABLET | Freq: Two times a day (BID) | ORAL | Status: AC

## 2011-09-11 MED ORDER — FLUCONAZOLE 150 MG PO TABS
150.0000 mg | ORAL_TABLET | Freq: Every day | ORAL | Status: AC
Start: 2011-09-11 — End: 2011-09-12

## 2011-09-14 ENCOUNTER — Telehealth: Payer: Self-pay

## 2011-09-14 MED ORDER — AZITHROMYCIN 500 MG PO TABS: 1000.0000 mg | ORAL_TABLET | Freq: Once | ORAL | Status: AC

## 2011-09-14 NOTE — Telephone Encounter (Signed)
TC TO PT REGARDING RESULTS. 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 SCHEDULED PT A TOC APPT AND WILL SEND IN RX FOR CT. PT VOICED UNDERSTANDING.

## 2011-09-18 ENCOUNTER — Telehealth: Payer: Self-pay | Admitting: Obstetrics and Gynecology

## 2011-09-18 NOTE — Telephone Encounter (Signed)
Ep's pt

## 2011-09-20 ENCOUNTER — Telehealth: Payer: Self-pay

## 2011-09-20 NOTE — Telephone Encounter (Signed)
Tc from [place] [facility] Regarding medicine for pt treated for ct. Informed "Gearldine Bienenstock that pt was treated for ct on the 09/14/11 . Brandy voiced understanding.

## 2011-10-03 ENCOUNTER — Encounter: Payer: Self-pay | Admitting: Obstetrics and Gynecology

## 2011-10-29 ENCOUNTER — Encounter (HOSPITAL_COMMUNITY): Payer: Self-pay | Admitting: *Deleted

## 2011-10-29 ENCOUNTER — Emergency Department (INDEPENDENT_AMBULATORY_CARE_PROVIDER_SITE_OTHER)
Admission: EM | Admit: 2011-10-29 | Discharge: 2011-10-29 | Disposition: A | Discharge status: Home / Self Care | Payer: Medicaid Other | Source: Home / Self Care | Attending: Family Medicine | Admitting: Family Medicine

## 2011-10-29 DIAGNOSIS — S335XXA Sprain of ligaments of lumbar spine, initial encounter: Secondary | ICD-10-CM

## 2011-10-29 DIAGNOSIS — H6091 Unspecified otitis externa, right ear: Secondary | ICD-10-CM

## 2011-10-29 DIAGNOSIS — H60399 Other infective otitis externa, unspecified ear: Secondary | ICD-10-CM

## 2011-10-29 MED ORDER — KETOROLAC TROMETHAMINE 60 MG/2ML IM SOLN
60.0000 mg | Freq: Once | INTRAMUSCULAR | Status: AC
  Administered 2011-10-29: 60 mg via INTRAMUSCULAR

## 2011-10-29 MED ORDER — KETOROLAC TROMETHAMINE 60 MG/2ML IM SOLN
INTRAMUSCULAR | Status: AC
  Filled 2011-10-29: qty 2

## 2011-10-29 MED ORDER — CIPROFLOXACIN-DEXAMETHASONE 0.3-0.1 % OT SUSP: 4.0000 [drp] | Freq: Two times a day (BID) | OTIC | Status: DC

## 2011-10-29 MED ORDER — NAPROXEN 500 MG PO TABS: 500.0000 mg | ORAL_TABLET | Freq: Two times a day (BID) | ORAL | Status: DC

## 2011-10-29 MED ORDER — CETIRIZINE-PSEUDOEPHEDRINE ER 5-120 MG PO TB12: 1.0000 | ORAL_TABLET | Freq: Two times a day (BID) | ORAL | Status: DC

## 2011-10-29 MED ORDER — CYCLOBENZAPRINE HCL 10 MG PO TABS: 10.0000 mg | ORAL_TABLET | Freq: Two times a day (BID) | ORAL | Status: DC | PRN

## 2011-10-29 NOTE — ED Provider Notes (Signed)
History     CSN: [id number]  Arrival date & time 10/29/11  1505   First MD Initiated Contact with Patient 10/29/11 1649      Chief Complaint  Patient presents with  . Back Pain    (Consider location/radiation/quality/duration/timing/severity/associated sxs/prior treatment) The history is provided by the patient.   Suzanne [name] is a 26 y.o. female who complains of left low back pain described as intermittent sharp in nature that began 7 days ago. The pain is aggravated with standing and walking, with radiation down the left leg. No known injury noted.  Works as a Production assistant, radio at [employer].  Denies history of back problems.  There is no associated numbness in the left leg. Has taken aleve for pain with minimal relief. Denies urinary symptoms.  Continent of both bowel and bladder.  Pain is 9/10.    Past Medical History  Diagnosis Date  . UTI (urinary tract infection)   . Vaginal trichomoniasis   . History of chicken pox   . Bacterial infection   . HPV (human papilloma virus) anogenital infection   . Dysplasia of cervix, low grade (CIN 1)   . LGSIL (low grade squamous intraepithelial dysplasia)     Past Surgical History  Procedure Date  . Skin graft     No family history on file.  History  Substance Use Topics  . Smoking status: Current Every Day Smoker -- 0.5 packs/day  . Smokeless tobacco: Not on file  . Alcohol Use: Yes    Review of Systems  Constitutional: Negative.   HENT: Positive for ear pain and ear discharge.   Respiratory: Negative.   Cardiovascular: Negative.   Genitourinary: Negative.   Musculoskeletal: Positive for back pain. Negative for myalgias, joint swelling, arthralgias and gait problem.  Skin: Negative.   Neurological: Negative.     Allergies  Review of patient's allergies indicates no known allergies.  Home Medications   Current Outpatient Rx  Name Route Sig Dispense Refill  . CETIRIZINE-PSEUDOEPHEDRINE ER 5-120 MG PO TB12 Oral Take 1  tablet by mouth 2 (two) times daily. 60 tablet 0  . CIPROFLOXACIN-DEXAMETHASONE 0.3-0.1 % OT SUSP Right Ear Place 4 drops into the right ear 2 (two) times daily. For 7 days 7.5 mL 0  . CYCLOBENZAPRINE HCL 10 MG PO TABS Oral Take 1 tablet (10 mg total) by mouth 2 (two) times daily as needed for muscle spasms. 20 tablet 0  . IBUPROFEN 600 MG PO TABS Oral Take 600 mg by mouth every 6 (six) hours as needed.      Marland Kitchen AQUAPHOR EX OINT Topical Apply topically as needed for dry skin. Apply, after showers 420 g 0  . NAPROXEN 500 MG PO TABS Oral Take 1 tablet (500 mg total) by mouth 2 (two) times daily with a meal. 60 tablet 1  . TRIAMCINOLONE ACETONIDE 0.1 % EX CREA Topical Apply topically 2 (two) times daily. Apply bid x affected areas except face for 2 weeks 82.5 g 0    BP 136/66  Pulse 71  Temp 98.1 F (36.7 C) (Oral)  Resp 16  SpO2 100%  LMP 10/24/2011  Physical Exam  Nursing note and vitals reviewed. Constitutional: She is oriented to person, place, and time. Vital signs are normal. She appears well-developed and well-nourished. She is active and cooperative.  HENT:  Head: Normocephalic.  Right Ear: Hearing normal. There is drainage and swelling.  Left Ear: Hearing, external ear and ear canal normal. Tympanic membrane is bulging.  Nose: Nose  normal. Right sinus exhibits no maxillary sinus tenderness and no frontal sinus tenderness. Left sinus exhibits no maxillary sinus tenderness and no frontal sinus tenderness.  Mouth/Throat: Uvula is midline, oropharynx is clear and moist and mucous membranes are normal.  Eyes: Conjunctivae normal are normal. Pupils are equal, round, and reactive to light. No scleral icterus.  Neck: Trachea normal, normal range of motion and full passive range of motion without pain. Neck supple. No spinous process tenderness and no muscular tenderness present.  Cardiovascular: Normal rate, regular rhythm, normal heart sounds, intact distal pulses and normal pulses.     Pulmonary/Chest: Effort normal and breath sounds normal.  Lymphadenopathy:       Head (right side): No submental, no submandibular, no tonsillar, no preauricular, no posterior auricular and no occipital adenopathy present.       Head (left side): No submental, no submandibular, no tonsillar, no preauricular, no posterior auricular and no occipital adenopathy present.    She has no cervical adenopathy.  Neurological: She is alert and oriented to person, place, and time. No cranial nerve deficit or sensory deficit.  Skin: Skin is warm and dry.  Psychiatric: She has a normal mood and affect. Her speech is normal and behavior is normal. Judgment and thought content normal. Cognition and memory are normal.    ED Course  Procedures (including critical care time)  Labs Reviewed - No data to display No results found.   1. Lumbar back sprain   2. Right otitis externa       MDM  Rest, intermittent application of cold packs (later, may switch to heat, but do not sleep on heating pad), analgesics and muscle relaxants as recommended. Proper lifting with avoidance of heavy lifting discussed. Consider physical therapy and X-ray studies if not improving. Call or return to clinic prn if these symptoms worsen or fail to improve as anticipated. Imaging not indicated at this time. Antibiotics for right ear as prescibed.  Return to [facility] in 3 days if symptoms are not improving.        Johnsie Kindred, NP 10/29/11 1746

## 2011-10-29 NOTE — ED Notes (Signed)
Pt has  Symptoms  Of  Back pain  Worse  On  Movement    She  denys  Any  specefic  Injury     She  Also  Reports  r  Earache   whuich  She  Reports  Has  Been  Draining as  Well -  Pt  Reports  The  Symptoms  X  3-5  Days

## 2011-10-29 NOTE — ED Provider Notes (Signed)
Medical screening examination/treatment/procedure(s) were performed by resident physician or non-physician practitioner and as supervising physician I was immediately available for consultation/collaboration.   KINDL,JAMES DOUGLAS MD.    James D Kindl, MD 10/29/11 2113 

## 2011-10-30 ENCOUNTER — Ambulatory Visit (INDEPENDENT_AMBULATORY_CARE_PROVIDER_SITE_OTHER): Payer: Medicaid Other | Admitting: Obstetrics and Gynecology

## 2011-10-30 ENCOUNTER — Other Ambulatory Visit: Payer: Self-pay

## 2011-10-30 ENCOUNTER — Encounter: Payer: Self-pay | Admitting: Obstetrics and Gynecology

## 2011-10-30 VITALS — BP 100/60 | Wt 139.0 lb

## 2011-10-30 DIAGNOSIS — Z2089 Contact with and (suspected) exposure to other communicable diseases: Secondary | ICD-10-CM

## 2011-10-30 DIAGNOSIS — A599 Trichomoniasis, unspecified: Secondary | ICD-10-CM

## 2011-10-30 DIAGNOSIS — Z202 Contact with and (suspected) exposure to infections with a predominantly sexual mode of transmission: Secondary | ICD-10-CM

## 2011-10-30 DIAGNOSIS — A749 Chlamydial infection, unspecified: Secondary | ICD-10-CM

## 2011-10-30 MED ORDER — METRONIDAZOLE 500 MG PO TABS: ORAL_TABLET | ORAL | Status: DC

## 2011-10-30 MED ORDER — FLUCONAZOLE 200 MG PO TABS: 200.0000 mg | ORAL_TABLET | Freq: Once | ORAL | Status: DC

## 2011-10-30 NOTE — Progress Notes (Signed)
F/o TOC CH[facility] Partner treated no IC since TX No vag irritation O  abdomen nontender, Normal hair distrubition mons pubis,  EGBUS WNL, sterile speculum exam,  vagina pink, moist normal rugae,  cerix LTC, no cervical motion tenderness, No adnexal masses or tenderness GC/CHL sent +trich, +BV A [place]ich[place] P discussed RX flagyl, diflucan, partner tx handout give    F/O  TOC Lavera Guise, CNM

## 2011-10-30 NOTE — Progress Notes (Signed)
Pt here for TOC

## 2011-11-15 ENCOUNTER — Encounter: Payer: Medicaid Other | Admitting: Obstetrics and Gynecology

## 2012-05-15 ENCOUNTER — Encounter (HOSPITAL_COMMUNITY): Payer: Self-pay | Admitting: Emergency Medicine

## 2012-05-15 ENCOUNTER — Emergency Department (HOSPITAL_COMMUNITY)
Admission: EM | Admit: 2012-05-15 | Discharge: 2012-05-15 | Disposition: A | Discharge status: Home / Self Care | Payer: Medicaid Other | Attending: Emergency Medicine | Admitting: Emergency Medicine

## 2012-05-15 DIAGNOSIS — Z8742 Personal history of other diseases of the female genital tract: Secondary | ICD-10-CM | POA: Insufficient documentation

## 2012-05-15 DIAGNOSIS — Z8619 Personal history of other infectious and parasitic diseases: Secondary | ICD-10-CM | POA: Insufficient documentation

## 2012-05-15 DIAGNOSIS — M549 Dorsalgia, unspecified: Secondary | ICD-10-CM

## 2012-05-15 DIAGNOSIS — Y990 Civilian activity done for income or pay: Secondary | ICD-10-CM | POA: Insufficient documentation

## 2012-05-15 DIAGNOSIS — F17200 Nicotine dependence, unspecified, uncomplicated: Secondary | ICD-10-CM | POA: Insufficient documentation

## 2012-05-15 DIAGNOSIS — Y9289 Other specified places as the place of occurrence of the external cause: Secondary | ICD-10-CM | POA: Insufficient documentation

## 2012-05-15 DIAGNOSIS — Y9389 Activity, other specified: Secondary | ICD-10-CM | POA: Insufficient documentation

## 2012-05-15 DIAGNOSIS — M62830 Muscle spasm of back: Secondary | ICD-10-CM

## 2012-05-15 DIAGNOSIS — Z87440 Personal history of urinary (tract) infections: Secondary | ICD-10-CM | POA: Insufficient documentation

## 2012-05-15 DIAGNOSIS — IMO0002 Reserved for concepts with insufficient information to code with codable children: Secondary | ICD-10-CM | POA: Insufficient documentation

## 2012-05-15 DIAGNOSIS — X500XXA Overexertion from strenuous movement or load, initial encounter: Secondary | ICD-10-CM | POA: Insufficient documentation

## 2012-05-15 LAB — URINALYSIS, ROUTINE W REFLEX MICROSCOPIC
Glucose, UA: NEGATIVE mg/dL
Hgb urine dipstick: NEGATIVE
Leukocytes, UA: NEGATIVE
Specific Gravity, Urine: 1.019 (ref 1.005–1.030)
pH: 7.5 (ref 5.0–8.0)

## 2012-05-15 LAB — URINE MICROSCOPIC-ADD ON

## 2012-05-15 MED ORDER — METHOCARBAMOL 500 MG PO TABS: 500.0000 mg | ORAL_TABLET | Freq: Two times a day (BID) | ORAL | Status: DC

## 2012-05-15 MED ORDER — OXYCODONE-ACETAMINOPHEN 5-325 MG PO TABS: 1.0000 | ORAL_TABLET | ORAL | Status: DC | PRN

## 2012-05-15 NOTE — ED Provider Notes (Signed)
History  This chart was scribed for non-physician practitioner Sharilyn Sites, PA-C working with Gavin Pound. Oletta Lamas, MD, by Candelaria Stagers, ED Scribe. This patient was seen in room WTR6/WTR6 and the patient's care was started at 9:22 PM   CSN: [id number]  Arrival date & time 05/15/12  2022   First MD Initiated Contact with Patient 05/15/12 2120      Chief Complaint  Patient presents with  . Back Pain    The history is provided by the patient. No language interpreter was used.   Suzanne [name] is a 27 y.o. female who presents to the Emergency Department complaining of lower back pain that started this morning after lifting a heavy object while at work. Denies any recent trauma or falls.  Pt states the back pain is worse with lifting her legs while seated.  Pt has taken aleve without significant relief.  She reports h/o kidney stones.   Pt denies h/o back problems.  She denies dysuria, numbness or paresthesias of LE.  No loss of bowel or bladder function.      Past Medical History  Diagnosis Date  . UTI (urinary tract infection)   . Vaginal trichomoniasis   . History of chicken pox   . Bacterial infection   . HPV (human papilloma virus) anogenital infection   . Dysplasia of cervix, low grade (CIN 1)   . LGSIL (low grade squamous intraepithelial dysplasia)     Past Surgical History  Procedure Laterality Date  . Skin graft      Family History  Problem Relation Age of Onset  . Diabetes Mother   . Cancer Mother     History  Substance Use Topics  . Smoking status: Current Every Day Smoker -- 0.50 packs/day  . Smokeless tobacco: Not on file  . Alcohol Use: Yes    OB History   Grav Para Term Preterm Abortions TAB SAB Ect Mult Living   2 1              Review of Systems  Musculoskeletal: Positive for back pain (lower back pain).  All other systems reviewed and are negative.    Allergies  Review of patient's allergies indicates no known allergies.  Home Medications    Current Outpatient Rx  Name  Route  Sig  Dispense  Refill  . naproxen sodium (ANAPROX) 220 MG tablet   Oral   Take 440 mg by mouth 2 (two) times daily as needed (for pain.).           BP 109/65  Pulse 75  Temp(Src) 97.6 F (36.4 C) (Oral)  Resp 18  SpO2 98%  LMP 05/05/2012  Physical Exam  Nursing note and vitals reviewed. Constitutional: She is oriented to person, place, and time. She appears well-developed and well-nourished.  HENT:  Head: Normocephalic and atraumatic.  Mouth/Throat: Oropharynx is clear and moist.  Eyes: Conjunctivae and EOM are normal. Pupils are equal, round, and reactive to light.  Neck: Normal range of motion.  Cardiovascular: Normal rate, regular rhythm and normal heart sounds.   Pulmonary/Chest: Effort normal and breath sounds normal.  Abdominal: Soft. Bowel sounds are normal.  Musculoskeletal: Normal range of motion.       Lumbar back: She exhibits tenderness, pain and spasm. She exhibits normal range of motion, no bony tenderness, no swelling, no edema, no deformity and no laceration.  LS muscle spasms, L > R, + straight leg raise; distal sensation intact, normal gait   Neurological: She is alert  and oriented to person, place, and time.  Skin: Skin is warm and dry.  Psychiatric: She has a normal mood and affect.    ED Course  Procedures   DIAGNOSTIC STUDIES: Oxygen Saturation is 98% on room air, normal by my interpretation.    COORDINATION OF CARE:  9:24 PM Discussed course of care with pt which includes urinalysis.  Pt understands and agrees.    Labs Reviewed  URINALYSIS, ROUTINE W REFLEX MICROSCOPIC - Abnormal; Notable for the following:    APPearance TURBID (*)    All other components within normal limits  URINE MICROSCOPIC-ADD ON - Abnormal; Notable for the following:    Squamous Epithelial / LPF MANY (*)    All other components within normal limits  URINE CULTURE   No results found.   1. Back pain   2. Muscle spasm of  back       MDM   Urine without definitive infection, culture pending. Will treat symptomatically for back pain. Rx Percocet and Robaxin. Followup with primary care physician, Dr. Shawnie Pons, if symptoms do not improve. Discussed plan with the patient- she agreed. Return precautions advised.  I personally performed the services described in this documentation, which was scribed in my presence. The recorded information has been reviewed and is accurate.        Garlon Hatchet, PA-C 05/16/12 [phone]

## 2012-05-15 NOTE — ED Notes (Signed)
Pt states she is having pain in her lower back that started this morning at work  Pt denies injury

## 2012-05-16 NOTE — ED Provider Notes (Signed)
Medical screening examination/treatment/procedure(s) were performed by non-physician practitioner and as supervising physician I was immediately available for consultation/collaboration.   Gavin Pound. Oletta Lamas, MD 05/16/12 [phone]

## 2012-05-18 LAB — URINE CULTURE

## 2013-03-24 ENCOUNTER — Encounter (HOSPITAL_COMMUNITY): Payer: Self-pay | Admitting: Emergency Medicine

## 2013-03-24 ENCOUNTER — Emergency Department (INDEPENDENT_AMBULATORY_CARE_PROVIDER_SITE_OTHER)
Admission: EM | Admit: 2013-03-24 | Discharge: 2013-03-24 | Disposition: A | Discharge status: Home / Self Care | Payer: Self-pay | Source: Home / Self Care | Attending: Family Medicine | Admitting: Family Medicine

## 2013-03-24 DIAGNOSIS — J00 Acute nasopharyngitis [common cold]: Secondary | ICD-10-CM

## 2013-03-24 DIAGNOSIS — H6590 Unspecified nonsuppurative otitis media, unspecified ear: Secondary | ICD-10-CM

## 2013-03-24 MED ORDER — METHYLPREDNISOLONE 4 MG PO KIT: PACK | ORAL | Status: DC

## 2013-03-24 MED ORDER — CHLORPHENIRAMINE-PSE-IBUPROFEN 2-30-200 MG PO TABS: ORAL_TABLET | ORAL | Status: DC

## 2013-03-24 MED ORDER — AMOXICILLIN 875 MG PO TABS: 875.0000 mg | ORAL_TABLET | Freq: Two times a day (BID) | ORAL | Status: DC

## 2013-03-24 MED ORDER — FLUTICASONE PROPIONATE 50 MCG/ACT NA SUSP: 2.0000 | Freq: Two times a day (BID) | NASAL | Status: DC

## 2013-03-24 NOTE — ED Provider Notes (Signed)
CSN: [id number]     Arrival date & time 03/24/13  1122 History   First MD Initiated Contact with Patient 03/24/13 1203     Chief Complaint  Patient presents with  . Otalgia     (Consider location/radiation/quality/duration/timing/severity/associated sxs/prior Treatment) HPI Comments: 28 year old female presents complaining of sore throat, cough, nasal congestion for 4 days. Yesterday, she began to have left ear pain. Every time she swallows, she has pain that radiates from her neck up into her left ear. She has taken ibuprofen for this but it has not helped significantly. She denies fever, chills, ear drainage. No recent travel or sick contacts.  Patient is a 28 y.o. female presenting with ear pain.  Otalgia Associated symptoms: congestion, cough and sore throat   Associated symptoms: no abdominal pain, no fever, no rash and no vomiting     Past Medical History  Diagnosis Date  . UTI (urinary tract infection)   . Vaginal trichomoniasis   . History of chicken pox   . Bacterial infection   . HPV (human papilloma virus) anogenital infection   . Dysplasia of cervix, low grade (CIN 1)   . LGSIL (low grade squamous intraepithelial dysplasia)    Past Surgical History  Procedure Laterality Date  . Skin graft     Family History  Problem Relation Age of Onset  . Diabetes Mother   . Cancer Mother    History  Substance Use Topics  . Smoking status: Current Every Day Smoker -- 0.50 packs/day  . Smokeless tobacco: Not on file  . Alcohol Use: Yes   OB History   Grav Para Term Preterm Abortions TAB SAB Ect Mult Living   2 1             Review of Systems  Constitutional: Negative for fever and chills.  HENT: Positive for congestion, ear pain and sore throat.   Eyes: Negative for visual disturbance.  Respiratory: Positive for cough. Negative for shortness of breath.   Cardiovascular: Negative for chest pain, palpitations and leg swelling.  Gastrointestinal: Negative for nausea,  vomiting and abdominal pain.  Endocrine: Negative for polydipsia and polyuria.  Genitourinary: Negative for dysuria, urgency and frequency.  Musculoskeletal: Negative for arthralgias and myalgias.  Skin: Negative for rash.  Neurological: Negative for dizziness, weakness and light-headedness.      Allergies  Review of patient's allergies indicates no known allergies.  Home Medications   Current Outpatient Rx  Name  Route  Sig  Dispense  Refill  . amoxicillin (AMOXIL) 875 MG tablet   Oral   Take 1 tablet (875 mg total) by mouth 2 (two) times daily.   14 tablet   0   . Chlorpheniramine-PSE-Ibuprofen (ADVIL ALLERGY SINUS) 2-30-200 MG TABS      1-2 tabs PO Q4-6 hrs PRN   50 each   1   . fluticasone (FLONASE) 50 MCG/ACT nasal spray   Each Nare   Place 2 sprays into both nostrils 2 (two) times daily.   16 g   2   . methocarbamol (ROBAXIN) 500 MG tablet   Oral   Take 1 tablet (500 mg total) by mouth 2 (two) times daily.   20 tablet   0   . methylPREDNISolone (MEDROL DOSEPAK) 4 MG tablet      follow package directions   21 tablet   0     Dispense as written.   . naproxen sodium (ANAPROX) 220 MG tablet   Oral   Take 440 mg by  mouth 2 (two) times daily as needed (for pain.).         Marland Kitchen oxyCODONE-acetaminophen (PERCOCET/ROXICET) 5-325 MG per tablet   Oral   Take 1 tablet by mouth every 4 (four) hours as needed for pain.   10 tablet   0    BP 139/72  Pulse 84  Temp(Src) 98.3 F (36.8 C) (Oral)  Resp 16  SpO2 100%  LMP 03/21/2013 Physical Exam  Nursing note and vitals reviewed. Constitutional: She is oriented to person, place, and time. Vital signs are normal. She appears well-developed and well-nourished. No distress.  HENT:  Head: Normocephalic and atraumatic.  Right Ear: External ear normal.  Left Ear: External ear normal. A middle ear effusion (serous, with air-fluid levels) is present.  Nose: Nose normal. Right sinus exhibits no maxillary sinus  tenderness and no frontal sinus tenderness. Left sinus exhibits no maxillary sinus tenderness and no frontal sinus tenderness.  Mouth/Throat: Uvula is midline, oropharynx is clear and moist and mucous membranes are normal. No oropharyngeal exudate.  Eyes: Conjunctivae are normal. Right eye exhibits no discharge. Left eye exhibits no discharge.  Neck: Normal range of motion. Neck supple. No JVD present.  Cardiovascular: Normal rate, regular rhythm and normal heart sounds.  Exam reveals no gallop and no friction rub.   No murmur heard. Pulmonary/Chest: Effort normal and breath sounds normal. No respiratory distress. She has no wheezes. She has no rales.  Lymphadenopathy:       Head (right side): No tonsillar adenopathy present.       Head (left side): Tonsillar adenopathy present.  Neurological: She is alert and oriented to person, place, and time. She has normal strength. Coordination normal.  Skin: Skin is warm and dry. No rash noted. She is not diaphoretic.  Psychiatric: She has a normal mood and affect. Judgment normal.    ED Course  Procedures (including critical care time) Labs Review Labs Reviewed - No data to display Imaging Review No results found.    MDM   Final diagnoses:  Acute nasopharyngitis (common cold)  Serous otitis media    We will try to treat this without antibiotics for 2 days, and she is worsening or is not beginning to improve she will start amoxicillin at that time. For now, treat with steroid, nasal spray, Advil allergy sinus.   Meds ordered this encounter  Medications  . methylPREDNISolone (MEDROL DOSEPAK) 4 MG tablet    Sig: follow package directions    Dispense:  21 tablet    Refill:  0    Order Specific Question:  Supervising Provider    Answer:  Clementeen Graham, S [[id number]]  . fluticasone (FLONASE) 50 MCG/ACT nasal spray    Sig: Place 2 sprays into both nostrils 2 (two) times daily.    Dispense:  16 g    Refill:  2    Order Specific Question:   Supervising Provider    Answer:  Clementeen Graham, S [id number]  . Chlorpheniramine-PSE-Ibuprofen (ADVIL ALLERGY SINUS) 2-30-200 MG TABS    Sig: 1-2 tabs PO Q4-6 hrs PRN    Dispense:  50 each    Refill:  1    Order Specific Question:  Supervising Provider    Answer:  Clementeen Graham, S [[id number]]  . amoxicillin (AMOXIL) 875 MG tablet    Sig: Take 1 tablet (875 mg total) by mouth 2 (two) times daily.    Dispense:  14 tablet    Refill:  0    Order Specific Question:  Supervising  Provider    Answer:  Clementeen Graham, Kathie Rhodes [[id number]]       Graylon Good, PA-C 03/24/13 [phone]

## 2013-03-24 NOTE — ED Notes (Signed)
Pt  Reports  l  Earache  With  sorethroat      Cough  /  Congestion         Pain  When  She  Swallows  With  The  Symptoms    X  4  Days

## 2013-03-24 NOTE — Discharge Instructions (Signed)
Antibiotic Nonuse  Your caregiver felt that the infection or problem was not one that would be helped with an antibiotic. Infections may be caused by viruses or bacteria. Only a caregiver can tell which one of these is the likely cause of an illness. A cold is the most common cause of infection in both adults and children. A cold is a virus. Antibiotic treatment will have no effect on a viral infection. Viruses can lead to many lost days of work caring for sick children and many missed days of school. Children may catch as many as 10 "colds" or "flus" per year during which they can be tearful, cranky, and uncomfortable. The goal of treating a virus is aimed at keeping the ill person comfortable. Antibiotics are medications used to help the body fight bacterial infections. There are relatively few types of bacteria that cause infections but there are hundreds of viruses. While both viruses and bacteria cause infection they are very different types of germs. A viral infection will typically go away by itself within 7 to 10 days. Bacterial infections may spread or get worse without antibiotic treatment. Examples of bacterial infections are:  Sore throats (like strep throat or tonsillitis).  Infection in the lung (pneumonia).  Ear and skin infections. Examples of viral infections are:  Colds or flus.  Most coughs and bronchitis.  Sore throats not caused by Strep.  Runny noses. It is often best not to take an antibiotic when a viral infection is the cause of the problem. Antibiotics can kill off the helpful bacteria that we have inside our body and allow harmful bacteria to start growing. Antibiotics can cause side effects such as allergies, nausea, and diarrhea without helping to improve the symptoms of the viral infection. Additionally, repeated uses of antibiotics can cause bacteria inside of our body to become resistant. That resistance can be passed onto harmful bacterial. The next time you have  an infection it may be harder to treat if antibiotics are used when they are not needed. Not treating with antibiotics allows our own immune system to develop and take care of infections more efficiently. Also, antibiotics will work better for us when they are prescribed for bacterial infections. Treatments for a child that is ill may include:  Give extra fluids throughout the day to stay hydrated.  Get plenty of rest.  Only give your child over-the-counter or prescription medicines for pain, discomfort, or fever as directed by your caregiver.  The use of a cool mist humidifier may help stuffy noses.  Cold medications if suggested by your caregiver. Your caregiver may decide to start you on an antibiotic if:  The problem you were seen for today continues for a longer length of time than expected.  You develop a secondary bacterial infection. SEEK MEDICAL CARE IF:  Fever lasts longer than 5 days.  Symptoms continue to get worse after 5 to 7 days or become severe.  Difficulty in breathing develops.  Signs of dehydration develop (poor drinking, rare urinating, dark colored urine).  Changes in behavior or worsening tiredness (listlessness or lethargy). Document Released: 03/26/2001 Document Revised: 04/09/2011 Document Reviewed: 09/22/2008 [facility] Patient Information 2014 [place]Care, [place].  Serous Otitis Media  Serous otitis media is fluid in the middle ear space. This space contains the bones for hearing and air. Air in the middle ear space helps to transmit sound.  The air gets there through the eustachian tube. This tube goes from the back of the nose (nasopharynx) to the middle  ear space. It keeps the pressure in the middle ear the same as the outside world. It also helps to drain fluid from the middle ear space. CAUSES  Serous otitis media occurs when the eustachian tube gets blocked. Blockage can come from:  Ear infections.  Colds and other upper respiratory  infections.  Allergies.  Irritants such as cigarette smoke.  Sudden changes in air pressure (such as descending in an airplane).  Enlarged adenoids.  A mass in the nasopharynx. During colds and upper respiratory infections, the middle ear space can become temporarily filled with fluid. This can happen after an ear infection also. Once the infection clears, the fluid will generally drain out of the ear through the eustachian tube. If it does not, then serous otitis media occurs. SIGNS AND SYMPTOMS   Hearing loss.  A feeling of fullness in the ear, without pain.  Young children may not show any symptoms but may show slight behavioral changes, such as agitation, ear pulling, or crying. DIAGNOSIS  Serous otitis media is diagnosed by an ear exam. Tests may be done to check on the movement of the eardrum. Hearing exams may also be done. TREATMENT  The fluid most often goes away without treatment. If allergy is the cause, allergy treatment may be helpful. Fluid that persists for several months may require minor surgery. A small tube is placed in the eardrum to:  Drain the fluid.  Restore the air in the middle ear space. In certain situations, antibiotics are used to avoid surgery. Surgery may be done to remove enlarged adenoids (if this is the cause). HOME CARE INSTRUCTIONS   Keep children away from tobacco smoke.  Be sure to keep any follow-up appointments. SEEK MEDICAL CARE IF:   Your hearing is not better in 3 months.  Your hearing is worse.  You have ear pain.  You have drainage from the ear.  You have dizziness.  You have serous otitis media only in one ear or have any bleeding from your nose (epistaxis).  You notice a lump on your neck. MAKE SURE YOU:  Understand these instructions.   Will watch your condition.   Will get help right away if you are not doing well or get worse.  Document Released: 04/07/2003 Document Revised: 09/17/2012 Document Reviewed:  08/12/2012 [facility] Patient Information 2014 [place], [place].

## 2013-03-25 NOTE — ED Provider Notes (Signed)
Medical screening examination/treatment/procedure(s) were performed by a resident physician or non-physician practitioner and as the supervising physician I was immediately available for consultation/collaboration.  Gloria Lambertson, MD    Abriana Saltos S Eliceo Gladu, MD 03/25/13 0742 

## 2013-08-24 ENCOUNTER — Encounter (HOSPITAL_COMMUNITY): Payer: Self-pay | Admitting: Emergency Medicine

## 2013-08-24 ENCOUNTER — Emergency Department (INDEPENDENT_AMBULATORY_CARE_PROVIDER_SITE_OTHER)
Admission: EM | Admit: 2013-08-24 | Discharge: 2013-08-24 | Disposition: A | Discharge status: Home / Self Care | Payer: Self-pay | Source: Home / Self Care | Attending: Family Medicine | Admitting: Family Medicine

## 2013-08-24 DIAGNOSIS — X58XXXA Exposure to other specified factors, initial encounter: Secondary | ICD-10-CM

## 2013-08-24 DIAGNOSIS — S01309A Unspecified open wound of unspecified ear, initial encounter: Secondary | ICD-10-CM

## 2013-08-24 DIAGNOSIS — S01301A Unspecified open wound of right ear, initial encounter: Secondary | ICD-10-CM

## 2013-08-24 DIAGNOSIS — H6092 Unspecified otitis externa, left ear: Secondary | ICD-10-CM

## 2013-08-24 DIAGNOSIS — H60399 Other infective otitis externa, unspecified ear: Secondary | ICD-10-CM

## 2013-08-24 HISTORY — DX: Cerebral cysts: G93.0

## 2013-08-24 MED ORDER — CIPROFLOXACIN-DEXAMETHASONE 0.3-0.1 % OT SUSP: 4.0000 [drp] | Freq: Two times a day (BID) | OTIC | Status: DC

## 2013-08-24 MED ORDER — ANTIPYRINE-BENZOCAINE 5.4-1.4 % OT SOLN: 3.0000 [drp] | OTIC | Status: DC | PRN

## 2013-08-24 NOTE — Discharge Instructions (Signed)
Your external ear canal is infected. Please start the ciprodex for the pain and full sensation Your R ear tube is still in, consider discussing this with the ENT doctor Please use the auralgan fo rpain on the left Please consider using ibuprofen 600mg  to help with the overall pain, swelling, and full sensation

## 2013-08-24 NOTE — ED Provider Notes (Signed)
CSN: [id number]     Arrival date & time 08/24/13  1429 History   First MD Initiated Contact with Patient 08/24/13 1455     Chief Complaint  Patient presents with  . Otalgia   (Consider location/radiation/quality/duration/timing/severity/associated sxs/prior Treatment) HPI  L ear pain: started yesterday. Getting wors. Throbbing. R not involved. Denies fevers, runny nose, nausea, diziness, amnd  cough. Associated w/ HA. Tylenol w/o benefit. No sick contacts. Uses wash cloth to clean ears. Difficulty hearing on the L - muffled sounds.    Past Medical History  Diagnosis Date  . UTI (urinary tract infection)   . Vaginal trichomoniasis   . History of chicken pox   . Bacterial infection   . HPV (human papilloma virus) anogenital infection   . Dysplasia of cervix, low grade (CIN 1)   . LGSIL (low grade squamous intraepithelial dysplasia)   . Brain cyst    Past Surgical History  Procedure Laterality Date  . Skin graft     Family History  Problem Relation Age of Onset  . Diabetes Mother   . Cancer Mother    History  Substance Use Topics  . Smoking status: Current Every Day Smoker -- 0.50 packs/day  . Smokeless tobacco: Not on file  . Alcohol Use: Yes   OB History   Grav Para Term Preterm Abortions TAB SAB Ect Mult Living   2 1             Review of Systems Per HPI with all other pertinent systems negative.   Allergies  Review of patient's allergies indicates no known allergies.  Home Medications   Prior to Admission medications   Medication Sig Start Date End Date Taking? Authorizing Provider  amoxicillin (AMOXIL) 875 MG tablet Take 1 tablet (875 mg total) by mouth 2 (two) times daily. 03/24/13   Graylon GoodZachary H Baker, PA-C  antipyrine-benzocaine Lyla Son(AURALGAN) otic solution Place 3-4 drops into the left ear every 2 (two) hours as needed for ear pain. 08/24/13   Ozella Rocksavid J Jayleigh Notarianni, MD  Chlorpheniramine-PSE-Ibuprofen (ADVIL ALLERGY SINUS) 2-30-200 MG TABS 1-2 tabs PO Q4-6 hrs PRN  03/24/13   Graylon GoodZachary H Baker, PA-C  ciprofloxacin-dexamethasone (CIPRODEX) otic suspension Place 4 drops into the left ear 2 (two) times daily. 08/24/13   Ozella Rocksavid J Saivon Prowse, MD  fluticasone (FLONASE) 50 MCG/ACT nasal spray Place 2 sprays into both nostrils 2 (two) times daily. 03/24/13   Graylon GoodZachary H Baker, PA-C  methocarbamol (ROBAXIN) 500 MG tablet Take 1 tablet (500 mg total) by mouth 2 (two) times daily. 05/15/12   Garlon HatchetLisa M Sanders, PA-C  methylPREDNISolone (MEDROL DOSEPAK) 4 MG tablet follow package directions 03/24/13   Graylon GoodZachary H Baker, PA-C  naproxen sodium (ANAPROX) 220 MG tablet Take 440 mg by mouth 2 (two) times daily as needed (for pain.).    Historical Provider, MD  oxyCODONE-acetaminophen (PERCOCET/ROXICET) 5-325 MG per tablet Take 1 tablet by mouth every 4 (four) hours as needed for pain. 05/15/12   Garlon HatchetLisa M Sanders, PA-C   BP 126/87  Pulse 79  Temp(Src) 98.4 F (36.9 C) (Oral)  Resp 16  Ht 5\' 1"  (1.549 m)  Wt 125 lb (56.7 kg)  BMI 23.63 kg/m2  SpO2 98%  LMP 08/14/2013 Physical Exam  Constitutional: She appears well-developed and well-nourished. No distress.  HENT:  Head: Normocephalic and atraumatic.  R TM w/ blue ear tube in place. Due to cerumen, difficult to tell if still in/through TM or stuck down by wax L TM injected w/ surounding erythema dn mild  swelling. TM scarred heavily but intact.   Eyes: EOM are normal. Pupils are equal, round, and reactive to light.  Cardiovascular: Normal rate.   No murmur heard. Pulmonary/Chest: Effort normal and breath sounds normal. No respiratory distress.  Abdominal: She exhibits no distension.  Musculoskeletal: Normal range of motion. She exhibits edema. She exhibits no tenderness.  Neurological: She is alert. No cranial nerve deficit. She exhibits normal muscle tone.  Skin: Skin is warm. She is not diaphoretic.  Psychiatric: She has a normal mood and affect. Her behavior is normal. Judgment and thought content normal.    ED Course  Procedures  (including critical care time) Labs Review Labs Reviewed - No data to display  Imaging Review No results found.   MDM   1. Otitis externa, left   2. Wound, open, ear, Eustachian tube, right, initial encounter    R retained ear tube - pt to f/u ENT  L Otitis externa: ciprodex. Auralgan PRN, Ibuprofen Q6  Precautions given and all questions answered   Shelly Flatten, MD Family Medicine 08/24/2013, 3:14 PM      Ozella Rocks, MD 08/24/13 [phone]

## 2013-08-24 NOTE — ED Notes (Signed)
C/o left ear pain States she hears some ringing too Does have hx of ear pain when she was small Denies any discharge No meds used States it hurts to swallow

## 2013-11-30 ENCOUNTER — Encounter (HOSPITAL_COMMUNITY): Payer: Self-pay | Admitting: Emergency Medicine

## 2014-06-10 ENCOUNTER — Emergency Department: Payer: Medicaid Other

## 2014-06-10 ENCOUNTER — Emergency Department
Admission: EM | Admit: 2014-06-10 | Discharge: 2014-06-10 | Discharge status: Against Medical Advice | Payer: Medicaid Other | Attending: Emergency Medicine | Admitting: Emergency Medicine

## 2014-06-10 DIAGNOSIS — S134XXA Sprain of ligaments of cervical spine, initial encounter: Secondary | ICD-10-CM | POA: Diagnosis not present

## 2014-06-10 DIAGNOSIS — Z792 Long term (current) use of antibiotics: Secondary | ICD-10-CM | POA: Insufficient documentation

## 2014-06-10 DIAGNOSIS — Z7951 Long term (current) use of inhaled steroids: Secondary | ICD-10-CM | POA: Insufficient documentation

## 2014-06-10 DIAGNOSIS — S0993XA Unspecified injury of face, initial encounter: Secondary | ICD-10-CM | POA: Diagnosis not present

## 2014-06-10 DIAGNOSIS — Z720 Tobacco use: Secondary | ICD-10-CM | POA: Insufficient documentation

## 2014-06-10 DIAGNOSIS — Z791 Long term (current) use of non-steroidal anti-inflammatories (NSAID): Secondary | ICD-10-CM | POA: Diagnosis not present

## 2014-06-10 DIAGNOSIS — Y92410 Unspecified street and highway as the place of occurrence of the external cause: Secondary | ICD-10-CM | POA: Insufficient documentation

## 2014-06-10 DIAGNOSIS — S199XXA Unspecified injury of neck, initial encounter: Secondary | ICD-10-CM | POA: Diagnosis present

## 2014-06-10 DIAGNOSIS — S139XXA Sprain of joints and ligaments of unspecified parts of neck, initial encounter: Secondary | ICD-10-CM

## 2014-06-10 DIAGNOSIS — Y9389 Activity, other specified: Secondary | ICD-10-CM | POA: Insufficient documentation

## 2014-06-10 DIAGNOSIS — Y998 Other external cause status: Secondary | ICD-10-CM | POA: Diagnosis not present
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
## 2014-06-10 NOTE — ED Notes (Signed)
Using blood draw kit provided by [employer], tourniquet applied to right upper arm; right wrist prepped with betadine and allowed to dry; blood samples collected, and given to officer to label and package, using chain of custody; needle removed, dressing applied; pt tolerated well

## 2014-07-06 DIAGNOSIS — Z720 Tobacco use: Secondary | ICD-10-CM | POA: Insufficient documentation

## 2014-07-06 DIAGNOSIS — F329 Major depressive disorder, single episode, unspecified: Secondary | ICD-10-CM | POA: Insufficient documentation

## 2014-07-06 DIAGNOSIS — F1020 Alcohol dependence, uncomplicated: Secondary | ICD-10-CM | POA: Insufficient documentation

## 2014-07-06 DIAGNOSIS — Z87440 Personal history of urinary (tract) infections: Secondary | ICD-10-CM | POA: Insufficient documentation

## 2014-07-06 DIAGNOSIS — Z8742 Personal history of other diseases of the female genital tract: Secondary | ICD-10-CM | POA: Insufficient documentation

## 2014-07-06 DIAGNOSIS — F1210 Cannabis abuse, uncomplicated: Secondary | ICD-10-CM | POA: Insufficient documentation

## 2014-07-06 DIAGNOSIS — Z8619 Personal history of other infectious and parasitic diseases: Secondary | ICD-10-CM | POA: Insufficient documentation

## 2014-07-06 DIAGNOSIS — Z3202 Encounter for pregnancy test, result negative: Secondary | ICD-10-CM | POA: Insufficient documentation

## 2014-07-06 DIAGNOSIS — Z8669 Personal history of other diseases of the nervous system and sense organs: Secondary | ICD-10-CM | POA: Insufficient documentation

## 2014-07-07 ENCOUNTER — Inpatient Hospital Stay (HOSPITAL_COMMUNITY)
Admission: AD | Admit: 2014-07-07 | Discharge: 2014-07-12 | DRG: 897 | Disposition: A | Discharge status: Home / Self Care | Payer: Medicaid Other | Source: Intra-hospital | Attending: Psychiatry | Admitting: Psychiatry

## 2014-07-07 ENCOUNTER — Emergency Department (HOSPITAL_COMMUNITY)
Admission: EM | Admit: 2014-07-07 | Discharge: 2014-07-07 | Disposition: A | Discharge status: Other Acute Inpatient Hospital | Payer: Self-pay | Attending: Emergency Medicine | Admitting: Emergency Medicine

## 2014-07-07 ENCOUNTER — Encounter (HOSPITAL_COMMUNITY): Payer: Self-pay | Admitting: Emergency Medicine

## 2014-07-07 ENCOUNTER — Encounter (HOSPITAL_COMMUNITY): Payer: Self-pay | Admitting: Behavioral Health

## 2014-07-07 DIAGNOSIS — Y906 Blood alcohol level of 120-199 mg/100 ml: Secondary | ICD-10-CM | POA: Diagnosis present

## 2014-07-07 DIAGNOSIS — F10229 Alcohol dependence with intoxication, unspecified: Principal | ICD-10-CM | POA: Diagnosis present

## 2014-07-07 DIAGNOSIS — F17210 Nicotine dependence, cigarettes, uncomplicated: Secondary | ICD-10-CM | POA: Diagnosis present

## 2014-07-07 DIAGNOSIS — F1020 Alcohol dependence, uncomplicated: Secondary | ICD-10-CM | POA: Diagnosis present

## 2014-07-07 DIAGNOSIS — F331 Major depressive disorder, recurrent, moderate: Secondary | ICD-10-CM | POA: Diagnosis present

## 2014-07-07 HISTORY — DX: Major depressive disorder, single episode, unspecified: F32.9

## 2014-07-07 HISTORY — DX: Depression, unspecified: F32.A

## 2014-07-07 LAB — CBC WITH DIFFERENTIAL/PLATELET
BASOS PCT: 0 % (ref 0–1)
Basophils Absolute: 0 10*3/uL (ref 0.0–0.1)
Eosinophils Absolute: 0 10*3/uL (ref 0.0–0.7)
Eosinophils Relative: 0 % (ref 0–5)
HCT: 38.9 % (ref 36.0–46.0)
HEMOGLOBIN: 13.1 g/dL (ref 12.0–15.0)
Lymphocytes Relative: 18 % (ref 12–46)
Lymphs Abs: 2 10*3/uL (ref 0.7–4.0)
MCH: 34.2 pg — AB (ref 26.0–34.0)
MCHC: 33.7 g/dL (ref 30.0–36.0)
MCV: 101.6 fL — ABNORMAL HIGH (ref 78.0–100.0)
MONOS PCT: 3 % (ref 3–12)
Monocytes Absolute: 0.3 10*3/uL (ref 0.1–1.0)
NEUTROS ABS: 9.2 10*3/uL — AB (ref 1.7–7.7)
Neutrophils Relative %: 79 % — ABNORMAL HIGH (ref 43–77)
Platelets: 296 10*3/uL (ref 150–400)
RBC: 3.83 MIL/uL — ABNORMAL LOW (ref 3.87–5.11)
RDW: 13.6 % (ref 11.5–15.5)
WBC: 11.6 10*3/uL — AB (ref 4.0–10.5)

## 2014-07-07 LAB — COMPREHENSIVE METABOLIC PANEL
ALBUMIN: 4.7 g/dL (ref 3.5–5.0)
ALT: 31 U/L (ref 14–54)
ANION GAP: 16 — AB (ref 5–15)
AST: 28 U/L (ref 15–41)
Alkaline Phosphatase: 78 U/L (ref 38–126)
BILIRUBIN TOTAL: 0.2 mg/dL — AB (ref 0.3–1.2)
BUN: 16 mg/dL (ref 6–20)
CALCIUM: 8.8 mg/dL — AB (ref 8.9–10.3)
CO2: 22 mmol/L (ref 22–32)
Chloride: 104 mmol/L (ref 101–111)
Creatinine, Ser: 0.77 mg/dL (ref 0.44–1.00)
GFR calc non Af Amer: >60 mL/min (ref >60)
Glucose, Bld: 93 mg/dL (ref 65–99)
Potassium: 3.1 mmol/L — ABNORMAL LOW (ref 3.5–5.1)
Sodium: 142 mmol/L (ref 135–145)
Total Protein: 7.5 g/dL (ref 6.5–8.1)

## 2014-07-07 LAB — RAPID URINE DRUG SCREEN, HOSP PERFORMED
Amphetamines: NOT DETECTED
Barbiturates: NOT DETECTED
Benzodiazepines: NOT DETECTED
COCAINE: NOT DETECTED
Opiates: NOT DETECTED
Tetrahydrocannabinol: POSITIVE — AB

## 2014-07-07 LAB — PREGNANCY, URINE: Preg Test, Ur: NEGATIVE

## 2014-07-07 LAB — ETHANOL: Alcohol, Ethyl (B): 157 mg/dL — ABNORMAL HIGH (ref <5)

## 2014-07-07 MED ORDER — VITAMIN B-1 100 MG PO TABS
100.0000 mg | ORAL_TABLET | Freq: Once | ORAL | Status: AC
Start: 2014-07-07 — End: 2014-07-07
  Administered 2014-07-07: 100 mg via ORAL
  Filled 2014-07-07 (×2): qty 1

## 2014-07-07 MED ORDER — HYDROXYZINE HCL 25 MG PO TABS
25.0000 mg | ORAL_TABLET | Freq: Four times a day (QID) | ORAL | Status: AC | PRN
  Administered 2014-07-07 – 2014-07-09 (×3): 25 mg via ORAL
  Filled 2014-07-07 (×3): qty 1

## 2014-07-07 MED ORDER — ACETAMINOPHEN 325 MG PO TABS: 650.0000 mg | ORAL_TABLET | ORAL | Status: DC | PRN

## 2014-07-07 MED ORDER — LORAZEPAM 1 MG PO TABS
1.0000 mg | ORAL_TABLET | Freq: Two times a day (BID) | ORAL | Status: AC
  Administered 2014-07-09 – 2014-07-10 (×2): 1 mg via ORAL
  Filled 2014-07-07 (×2): qty 1

## 2014-07-07 MED ORDER — LORAZEPAM 1 MG PO TABS
1.0000 mg | ORAL_TABLET | Freq: Every day | ORAL | Status: AC
  Administered 2014-07-11: 1 mg via ORAL
  Filled 2014-07-07: qty 1

## 2014-07-07 MED ORDER — LORAZEPAM 1 MG PO TABS: 1.0000 mg | ORAL_TABLET | Freq: Every day | ORAL | Status: DC

## 2014-07-07 MED ORDER — ACETAMINOPHEN 325 MG PO TABS
650.0000 mg | ORAL_TABLET | ORAL | Status: DC | PRN
  Administered 2014-07-10 – 2014-07-11 (×2): 650 mg via ORAL
  Filled 2014-07-07 (×2): qty 2

## 2014-07-07 MED ORDER — LORAZEPAM 1 MG PO TABS
1.0000 mg | ORAL_TABLET | Freq: Four times a day (QID) | ORAL | Status: DC
  Administered 2014-07-07: 1 mg via ORAL
  Filled 2014-07-07: qty 1

## 2014-07-07 MED ORDER — LORAZEPAM 1 MG PO TABS: 1.0000 mg | ORAL_TABLET | Freq: Three times a day (TID) | ORAL | Status: DC

## 2014-07-07 MED ORDER — VITAMIN B-1 100 MG PO TABS: 100.0000 mg | ORAL_TABLET | Freq: Every day | ORAL | Status: DC

## 2014-07-07 MED ORDER — NICOTINE 21 MG/24HR TD PT24
21.0000 mg | MEDICATED_PATCH | Freq: Every day | TRANSDERMAL | Status: DC
  Administered 2014-07-08 – 2014-07-11 (×4): 21 mg via TRANSDERMAL
  Filled 2014-07-07: qty 1
  Filled 2014-07-07: qty 14
  Filled 2014-07-07 (×5): qty 1

## 2014-07-07 MED ORDER — LOPERAMIDE HCL 2 MG PO CAPS: 2.0000 mg | ORAL_CAPSULE | ORAL | Status: DC | PRN

## 2014-07-07 MED ORDER — ONDANSETRON 4 MG PO TBDP: 4.0000 mg | ORAL_TABLET | Freq: Four times a day (QID) | ORAL | Status: DC | PRN

## 2014-07-07 MED ORDER — ONDANSETRON HCL 4 MG PO TABS: 4.0000 mg | ORAL_TABLET | Freq: Three times a day (TID) | ORAL | Status: DC | PRN

## 2014-07-07 MED ORDER — LOPERAMIDE HCL 2 MG PO CAPS: 2.0000 mg | ORAL_CAPSULE | ORAL | Status: AC | PRN

## 2014-07-07 MED ORDER — LORAZEPAM 1 MG PO TABS
1.0000 mg | ORAL_TABLET | Freq: Three times a day (TID) | ORAL | Status: AC
  Administered 2014-07-08 – 2014-07-09 (×3): 1 mg via ORAL
  Filled 2014-07-07 (×4): qty 1

## 2014-07-07 MED ORDER — ADULT MULTIVITAMIN W/MINERALS CH
1.0000 | ORAL_TABLET | Freq: Every day | ORAL | Status: DC
  Administered 2014-07-07: 1 via ORAL
  Filled 2014-07-07: qty 1

## 2014-07-07 MED ORDER — LORAZEPAM 1 MG PO TABS: 1.0000 mg | ORAL_TABLET | Freq: Four times a day (QID) | ORAL | Status: DC | PRN

## 2014-07-07 MED ORDER — HYDROXYZINE HCL 25 MG PO TABS: 25.0000 mg | ORAL_TABLET | Freq: Four times a day (QID) | ORAL | Status: DC | PRN

## 2014-07-07 MED ORDER — LORAZEPAM 1 MG PO TABS: 1.0000 mg | ORAL_TABLET | Freq: Two times a day (BID) | ORAL | Status: DC

## 2014-07-07 MED ORDER — THIAMINE HCL 100 MG/ML IJ SOLN: 100.0000 mg | Freq: Once | INTRAMUSCULAR | Status: DC

## 2014-07-07 MED ORDER — ADULT MULTIVITAMIN W/MINERALS CH
1.0000 | ORAL_TABLET | Freq: Every day | ORAL | Status: DC
  Administered 2014-07-08 – 2014-07-12 (×5): 1 via ORAL
  Filled 2014-07-07 (×7): qty 1

## 2014-07-07 MED ORDER — POTASSIUM CHLORIDE CRYS ER 20 MEQ PO TBCR: 40.0000 meq | EXTENDED_RELEASE_TABLET | Freq: Once | ORAL | Status: DC

## 2014-07-07 MED ORDER — POTASSIUM CHLORIDE CRYS ER 20 MEQ PO TBCR
40.0000 meq | EXTENDED_RELEASE_TABLET | Freq: Once | ORAL | Status: AC
  Administered 2014-07-08: 40 meq via ORAL
  Filled 2014-07-07 (×2): qty 2

## 2014-07-07 MED ORDER — VITAMIN B-1 100 MG PO TABS
100.0000 mg | ORAL_TABLET | Freq: Every day | ORAL | Status: DC
  Administered 2014-07-08 – 2014-07-12 (×5): 100 mg via ORAL
  Filled 2014-07-07 (×8): qty 1

## 2014-07-07 MED ORDER — NICOTINE 21 MG/24HR TD PT24
21.0000 mg | MEDICATED_PATCH | Freq: Every day | TRANSDERMAL | Status: DC
  Administered 2014-07-07: 21 mg via TRANSDERMAL
  Filled 2014-07-07: qty 1

## 2014-07-07 MED ORDER — ONDANSETRON 4 MG PO TBDP: 4.0000 mg | ORAL_TABLET | Freq: Four times a day (QID) | ORAL | Status: AC | PRN

## 2014-07-07 MED ORDER — POTASSIUM CHLORIDE CRYS ER 20 MEQ PO TBCR
40.0000 meq | EXTENDED_RELEASE_TABLET | Freq: Once | ORAL | Status: AC
  Administered 2014-07-07: 40 meq via ORAL
  Filled 2014-07-07: qty 2

## 2014-07-07 MED ORDER — VITAMIN B-1 100 MG PO TABS
100.0000 mg | ORAL_TABLET | Freq: Once | ORAL | Status: AC
  Administered 2014-07-07: 100 mg via ORAL
  Filled 2014-07-07: qty 1

## 2014-07-07 MED ORDER — LORAZEPAM 1 MG PO TABS
1.0000 mg | ORAL_TABLET | Freq: Four times a day (QID) | ORAL | Status: AC
  Administered 2014-07-07 – 2014-07-08 (×3): 1 mg via ORAL
  Filled 2014-07-07 (×3): qty 1

## 2014-07-07 MED ORDER — LORAZEPAM 1 MG PO TABS
1.0000 mg | ORAL_TABLET | Freq: Four times a day (QID) | ORAL | Status: AC | PRN
  Administered 2014-07-08 – 2014-07-09 (×2): 1 mg via ORAL
  Filled 2014-07-07 (×2): qty 1

## 2014-07-07 NOTE — BH Assessment (Signed)
Tele Assessment Note   LIBRA GATZ is a 29 y.o. female who voluntarily presents to [facility] for alcohol detox.  Pt denies SI/HI/AVH.  She is tearful during the interview with this Clinical research associate, stating she got a DUI with her 67 yr old daughter in the car.  Pt says her court dates are 07/13/14 and 07/26/14.  Pt says she drinks 1.5 pints of alcohol at least 2 days a week.  Pt says her daughter lives with her aunt and she wants her daughter back.    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 UTI (urinary tract infection)   . Vaginal trichomoniasis   . History of chicken pox   . Bacterial infection   . HPV (human papilloma virus) anogenital infection   . Dysplasia of cervix, low grade (CIN 1)   . LGSIL (low grade squamous intraepithelial dysplasia)   . Brain cyst   . Depression    Axis IV: other psychosocial or environmental problems, problems related to social environment and problems with primary support group Axis V: 41-50 serious symptoms  Past Medical History:  Past Medical History  Diagnosis Date  . UTI (urinary tract infection)   . Vaginal trichomoniasis   . History of chicken pox   . Bacterial infection   . HPV (human papilloma virus) anogenital infection   . Dysplasia of cervix, low grade (CIN 1)   . LGSIL (low grade squamous intraepithelial dysplasia)   . Brain cyst   . Depression     Past Surgical History  Procedure Laterality Date  . Skin graft      Family History:  Family History  Problem Relation Age of Onset  . Diabetes Mother   . Cancer Mother     Social History:  reports that she has been smoking Cigarettes.  She has been smoking about 0.50 packs per day. She does not have any smokeless tobacco history on file. She reports that she drinks alcohol. She reports that she uses illicit drugs (Marijuana).  Additional Social History:  Alcohol / Drug Use Pain Medications: See MAR   Prescriptions: See MAR  Over the Counter: See MAR  History of alcohol / drug use?: Yes Longest period of sobriety (when/how long): None  Negative Consequences of Use: Work / Programmer, multimedia, Copywriter, advertising relationships, Armed forces operational officer, Surveyor, quantity Withdrawal Symptoms: Other (Comment) (No current w/d sxs ) Substance #1 Name of Substance 1: Alcohol  1 - Age of First Use: 23 YOF  1 - Amount (size/oz): 1.5 Pints  1 - Frequency: 1-2 Days wkly  1 - Duration: On-going  1 - Last Use / Amount: 07/05/14  CIWA: CIWA-Ar BP: 123/82 mmHg Pulse Rate: 103 COWS:    PATIENT STRENGTHS: (choose at least two) Motivation for treatment/growth  Allergies: No Known Allergies  Home Medications:  (Not in a hospital admission)  OB/GYN Status:  Patient's last menstrual period was 06/26/2014.  General Assessment Data Location of Assessment: [facility] ED TTS Assessment: In system Is this a Tele or Face-to-Face Assessment?: Tele Assessment Is this an Initial Assessment or a Re-assessment for this encounter?: Initial Assessment Marital status: Single Maiden name: None  Is patient pregnant?: No Pregnancy Status: No Living Arrangements: Alone Can pt return to current living arrangement?: Yes Admission Status: Voluntary Is patient capable of signing voluntary admission?: Yes Referral Source: MD Insurance type: SP   Medical Screening Exam [facility] Walk-in ONLY) Medical Exam completed: No Reason for MSE not  completed: Other:  Crisis Care Plan Living Arrangements: Alone Name of Psychiatrist: None  Name of Therapist: None   Education Status Is patient currently in school?: No Current Grade: None  Highest grade of school patient has completed: None  Name of school: None  Contact person: None   Risk to self with the past 6 months Suicidal Ideation: No Has patient been a risk to self within the past 6 months prior to admission? : No Suicidal Intent: No Has patient had any suicidal intent within the past 6 months prior to  admission? : No Is patient at risk for suicide?: No Suicidal Plan?: No Has patient had any suicidal plan within the past 6 months prior to admission? : No Access to Means: No What has been your use of drugs/alcohol within the last 12 months?: Abusing:alcohol  Previous Attempts/Gestures: No How many times?: 0 Other Self Harm Risks: None  Triggers for Past Attempts: None known Intentional Self Injurious Behavior: None Family Suicide History: No Recent stressful life event(s): Loss (Comment), Legal Issues (Daughter lives with aunt; several DUI's ) Persecutory voices/beliefs?: No Depression: Yes Depression Symptoms: Tearfulness, Loss of interest in usual pleasures, Feeling worthless/self pity, Guilt Substance abuse history and/or treatment for substance abuse?: Yes Suicide prevention information given to non-admitted patients: Not applicable  Risk to Others within the past 6 months Homicidal Ideation: No Does patient have any lifetime risk of violence toward others beyond the six months prior to admission? : No Thoughts of Harm to Others: No Current Homicidal Intent: No Current Homicidal Plan: No Access to Homicidal Means: No Identified Victim: None  History of harm to others?: No Assessment of Violence: None Noted Violent Behavior Description: None  Does patient have access to weapons?: No Criminal Charges Pending?: No Does patient have a court date: No Is patient on probation?: No  Psychosis Hallucinations: None noted Delusions: None noted  Mental Status Report Appearance/Hygiene: Other (Comment) (Appropriate ) Eye Contact: Fair Motor Activity: Unremarkable Speech: Logical/coherent, Soft Level of Consciousness: Alert Mood: Depressed Affect: Depressed Anxiety Level: None Thought Processes: Coherent, Relevant Judgement: Unimpaired Orientation: Person, Place, Time, Situation Obsessive Compulsive Thoughts/Behaviors: None  Cognitive Functioning Concentration:  Normal Memory: Recent Intact, Remote Intact IQ: Average Insight: Fair Impulse Control: Fair Appetite: Fair Weight Loss: 0 Weight Gain: 0 Sleep: No Change Total Hours of Sleep: 7 Vegetative Symptoms: None  ADLScreening [facility] Assessment Services) Patient's cognitive ability adequate to safely complete daily activities?: Yes Patient able to express need for assistance with ADLs?: Yes Independently performs ADLs?: Yes (appropriate for developmental age)  Prior Inpatient Therapy Prior Inpatient Therapy: No Prior Therapy Dates: None  Prior Therapy Facilty/Provider(s): None  Reason for Treatment: None   Prior Outpatient Therapy Prior Outpatient Therapy: No Prior Therapy Dates: None  Prior Therapy Facilty/Provider(s): None  Reason for Treatment: None  Does patient have an ACCT team?: No Does patient have Intensive In-House Services?  : No Does patient have [facility] services? : No Does patient have P4CC services?: No  ADL Screening (condition at time of admission) Patient's cognitive ability adequate to safely complete daily activities?: Yes Is the patient deaf or have difficulty hearing?: No Does the patient have difficulty seeing, even when wearing glasses/contacts?: No Does the patient have difficulty concentrating, remembering, or making decisions?: No Patient able to express need for assistance with ADLs?: Yes Does the patient have difficulty dressing or bathing?: No Independently performs ADLs?: Yes (appropriate for developmental age) Does the patient have difficulty walking or climbing stairs?: No Weakness of  Legs: None Weakness of Arms/Hands: None  Home Assistive Devices/Equipment Home Assistive Devices/Equipment: None  Therapy Consults (therapy consults require a physician order) PT Evaluation Needed: No OT Evalulation Needed: No SLP Evaluation Needed: No Abuse/Neglect Assessment (Assessment to be complete while patient is alone) Physical Abuse: Denies Verbal  Abuse: Denies Sexual Abuse: Denies Exploitation of patient/patient's resources: Denies Self-Neglect: Denies Values / Beliefs Cultural Requests During Hospitalization: None Spiritual Requests During Hospitalization: None Consults Spiritual Care Consult Needed: No Social Work Consult Needed: No Merchant navy officerAdvance Directives (For Healthcare) Does patient have an advance directive?: No Would patient like information on creating an advanced directive?: No - patient declined information    Additional Information 1:1 In Past 12 Months?: No CIRT Risk: No Elopement Risk: No Does patient have medical clearance?: No     Disposition:  Disposition Initial Assessment Completed for this Encounter: Yes Disposition of Patient: Referred to (Per Donell SievertSpencer Simon, PA will review once med cleared ) Patient referred to: Other (Comment) (Per Donell SievertSpencer Simon, PA will review once med cleared )  Murrell ReddenSimmons, Arhan Mcmanamon C 07/07/2014 5:44 AM

## 2014-07-07 NOTE — ED Notes (Signed)
Pt triaged on paper chart

## 2014-07-07 NOTE — Consult Note (Signed)
[facility] Consult   Reason for Consult:  Alcohol use disorder, severe Referring Physician:  EDP Patient Identification: Suzanne [name] MRN:  [id number] Principal Diagnosis: Alcohol use disorder, severe, dependence Diagnosis:   Patient Active Problem List   Diagnosis Date Noted  . Alcohol use disorder, severe, dependence [F10.20] 07/07/2014    Priority: High    Total Time spent with patient: 1 hour  Subjective:   Suzanne [name] is a 29 y.o. female patient admitted with Alcohol use disorder, severe.  HPI: Caucasian female, 29 years old was evaluated for Alcohol use.  Patient reports that she is seeking treatment in other to stop drinking because she is about to lose her 77 year old daughter.   Patient reports that she has been drinking Alcohol for a long time after losing both parents to Alcoholism.  She states that she no longer drink daily but she binge drinks.  She reports drinking  A pint of Liquor when she drinks.  Her Alcohol level on arrival was 257.  Patient reports that her drinking is becoming an issue due to several DUI and that recently she was caught with her 66 years old daughter in the car while she was intoxicated.  Patient reports that he daughter has been staying with somebody since January and that she has a court date coming up.  Patient was tearful and stated that she feels sad and worried about losing her daughter.  Patient reports poor sleep and appetite.  She denies SI/HI/AVH but then she stated she feels hopeless and helpless with her daughter.   She has been accepted for admission and has a bed assigned to her.  HPI Elements:   Location:  Alcohol use disordr. Quality:  severe. Severity:  severe. Timing:  Acute. Duration:  Chronic Alcoholism. Context:  Seeking detox treatment.  Past Medical History:  Past Medical History  Diagnosis Date  . UTI (urinary tract infection)   . Vaginal trichomoniasis   . History of chicken pox   . Bacterial  infection   . HPV (human papilloma virus) anogenital infection   . Dysplasia of cervix, low grade (CIN 1)   . LGSIL (low grade squamous intraepithelial dysplasia)   . Brain cyst   . Depression     Past Surgical History  Procedure Laterality Date  . Skin graft     Family History:  Family History  Problem Relation Age of Onset  . Diabetes Mother   . Cancer Mother    Social History:  History  Alcohol Use  . Yes    Comment: binge drinks     History  Drug Use  . Yes  . Special: Marijuana    History   Social History  . Marital Status: Single    Spouse Name: N/A  . Number of Children: N/A  . Years of Education: N/A   Social History Main Topics  . Smoking status: Current Every Day Smoker -- 0.50 packs/day    Types: Cigarettes  . Smokeless tobacco: Not on file  . Alcohol Use: Yes     Comment: binge drinks  . Drug Use: Yes    Special: Marijuana  . Sexual Activity: Yes    Birth Control/ Protection: None   Other Topics Concern  . None   Social History Narrative   Additional Social History:    Pain Medications: See MAR  Prescriptions: See MAR  Over the Counter: See MAR  History of alcohol / drug use?: Yes Longest period of sobriety (  when/how long): None  Negative Consequences of Use: Work / Youth worker, Charity fundraiser relationships, Scientist, research (physical sciences), Museum/gallery curator Withdrawal Symptoms: Other (Comment) (No current w/d sxs ) Name of Substance 1: Alcohol  1 - Age of First Use: 75 YOF  1 - Amount (size/oz): 1.5 Pints  1 - Frequency: 1-2 Days wkly  1 - Duration: On-going  1 - Last Use / Amount: 07/05/14                   Allergies:  No Known Allergies  Labs:  Results for orders placed or performed during the hospital encounter of 07/07/14 (from the past 48 hour(s))  CBC with Differential     Status: Abnormal   Collection Time: 07/07/14  5:22 AM  Result Value Ref Range   WBC 11.6 (H) 4.0 - 10.5 K/uL   RBC 3.83 (L) 3.87 - 5.11 MIL/uL   Hemoglobin 13.1 12.0 - 15.0 g/dL   HCT 38.9  36.0 - 46.0 %   MCV 101.6 (H) 78.0 - 100.0 fL   MCH 34.2 (H) 26.0 - 34.0 pg   MCHC 33.7 30.0 - 36.0 g/dL   RDW 13.6 11.5 - 15.5 %   Platelets 296 150 - 400 K/uL   Neutrophils Relative % 79 (H) 43 - 77 %   Neutro Abs 9.2 (H) 1.7 - 7.7 K/uL   Lymphocytes Relative 18 12 - 46 %   Lymphs Abs 2.0 0.7 - 4.0 K/uL   Monocytes Relative 3 3 - 12 %   Monocytes Absolute 0.3 0.1 - 1.0 K/uL   Eosinophils Relative 0 0 - 5 %   Eosinophils Absolute 0.0 0.0 - 0.7 K/uL   Basophils Relative 0 0 - 1 %   Basophils Absolute 0.0 0.0 - 0.1 K/uL  Comprehensive metabolic panel     Status: Abnormal   Collection Time: 07/07/14  5:22 AM  Result Value Ref Range   Sodium 142 135 - 145 mmol/L   Potassium 3.1 (L) 3.5 - 5.1 mmol/L   Chloride 104 101 - 111 mmol/L   CO2 22 22 - 32 mmol/L   Glucose, Bld 93 65 - 99 mg/dL   BUN 16 6 - 20 mg/dL   Creatinine, Ser 0.77 0.44 - 1.00 mg/dL   Calcium 8.8 (L) 8.9 - 10.3 mg/dL   Total Protein 7.5 6.5 - 8.1 g/dL   Albumin 4.7 3.5 - 5.0 g/dL   AST 28 15 - 41 U/L   ALT 31 14 - 54 U/L   Alkaline Phosphatase 78 38 - 126 U/L   Total Bilirubin 0.2 (L) 0.3 - 1.2 mg/dL   GFR calc non Af Amer >60 >60 mL/min   GFR calc Af Amer >60 >60 mL/min    Comment: (NOTE) The eGFR has been calculated using the CKD EPI equation. This calculation has not been validated in all clinical situations. eGFR's persistently <60 mL/min signify possible Chronic Kidney Disease.    Anion gap 16 (H) 5 - 15  Ethanol     Status: Abnormal   Collection Time: 07/07/14  5:22 AM  Result Value Ref Range   Alcohol, Ethyl (B) 157 (H) <5 mg/dL    Comment:        LOWEST DETECTABLE LIMIT FOR SERUM ALCOHOL IS 5 mg/dL FOR MEDICAL PURPOSES ONLY   Urine rapid drug screen (hosp performed)     Status: Abnormal   Collection Time: 07/07/14  5:25 AM  Result Value Ref Range   Opiates NONE DETECTED NONE DETECTED   Cocaine NONE DETECTED  NONE DETECTED   Benzodiazepines NONE DETECTED NONE DETECTED   Amphetamines NONE  DETECTED NONE DETECTED   Tetrahydrocannabinol POSITIVE (A) NONE DETECTED   Barbiturates NONE DETECTED NONE DETECTED    Comment:        DRUG SCREEN FOR MEDICAL PURPOSES ONLY.  IF CONFIRMATION IS NEEDED FOR ANY PURPOSE, NOTIFY LAB WITHIN 5 DAYS.        LOWEST DETECTABLE LIMITS FOR URINE DRUG SCREEN Drug Class       Cutoff (ng/mL) Amphetamine      1000 Barbiturate      200 Benzodiazepine   017 Tricyclics       494 Opiates          300 Cocaine          300 THC              50   Pregnancy, urine     Status: None   Collection Time: 07/07/14  5:25 AM  Result Value Ref Range   Preg Test, Ur NEGATIVE NEGATIVE    Comment:        THE SENSITIVITY OF THIS METHODOLOGY IS >20 mIU/mL.     Vitals: Blood pressure 122/67, pulse 96, temperature 98.4 F (36.9 C), temperature source Oral, resp. rate 16, last menstrual period 06/26/2014, SpO2 100 %.  Risk to Self: Suicidal Ideation: No Suicidal Intent: No Is patient at risk for suicide?: No Suicidal Plan?: No Access to Means: No What has been your use of drugs/alcohol within the last 12 months?: Abusing:alcohol  How many times?: 0 Other Self Harm Risks: None  Triggers for Past Attempts: None known Intentional Self Injurious Behavior: None Risk to Others: Homicidal Ideation: No Thoughts of Harm to Others: No Current Homicidal Intent: No Current Homicidal Plan: No Access to Homicidal Means: No Identified Victim: None  History of harm to others?: No Assessment of Violence: None Noted Violent Behavior Description: None  Does patient have access to weapons?: No Criminal Charges Pending?: No Does patient have a court date: No Prior Inpatient Therapy: Prior Inpatient Therapy: No Prior Therapy Dates: None  Prior Therapy Facilty/Provider(s): None  Reason for Treatment: None  Prior Outpatient Therapy: Prior Outpatient Therapy: No Prior Therapy Dates: None  Prior Therapy Facilty/Provider(s): None  Reason for Treatment: None  Does patient  have an ACCT team?: No Does patient have Intensive In-House Services?  : No Does patient have [facility] services? : No Does patient have P4CC services?: No  Current Facility-Administered Medications  Medication Dose Route Frequency Provider Last Rate Last Dose  . acetaminophen (TYLENOL) tablet 650 mg  650 mg Oral Q4H PRN Charlann Lange, PA-C      . hydrOXYzine (ATARAX/VISTARIL) tablet 25 mg  25 mg Oral Q6H PRN Delfin Gant, NP      . loperamide (IMODIUM) capsule 2-4 mg  2-4 mg Oral PRN Delfin Gant, NP      . LORazepam (ATIVAN) tablet 1 mg  1 mg Oral Q6H PRN Delfin Gant, NP      . LORazepam (ATIVAN) tablet 1 mg  1 mg Oral QID Delfin Gant, NP   1 mg at 07/07/14 1345   Followed by  . [START ON 07/08/2014] LORazepam (ATIVAN) tablet 1 mg  1 mg Oral TID Delfin Gant, NP       Followed by  . [START ON 07/09/2014] LORazepam (ATIVAN) tablet 1 mg  1 mg Oral BID Delfin Gant, NP       Followed by  . [  START ON 07/11/2014] LORazepam (ATIVAN) tablet 1 mg  1 mg Oral Daily Delfin Gant, NP      . multivitamin with minerals tablet 1 tablet  1 tablet Oral Daily Delfin Gant, NP   1 tablet at 07/07/14 1345  . nicotine (NICODERM CQ - dosed in mg/24 hours) patch 21 mg  21 mg Transdermal Daily Charlann Lange, PA-C   21 mg at 07/07/14 0851  . ondansetron (ZOFRAN) tablet 4 mg  4 mg Oral Q8H PRN Shari Upstill, PA-C      . ondansetron (ZOFRAN-ODT) disintegrating tablet 4 mg  4 mg Oral Q6H PRN Delfin Gant, NP      . Derrill Memo ON 07/08/2014] potassium chloride SA (K-DUR,KLOR-CON) CR tablet 40 mEq  40 mEq Oral Once Delfin Gant, NP      . thiamine (B-1) injection 100 mg  100 mg Intramuscular Once Delfin Gant, NP   100 mg at 07/07/14 1340  . [START ON 07/08/2014] thiamine (VITAMIN B-1) tablet 100 mg  100 mg Oral Daily Delfin Gant, NP       Current Outpatient Prescriptions  Medication Sig Dispense Refill  . ibuprofen (ADVIL,MOTRIN) 200 MG tablet Take 400 mg  by mouth every 6 (six) hours as needed for moderate pain.    Marland Kitchen amoxicillin (AMOXIL) 875 MG tablet Take 1 tablet (875 mg total) by mouth 2 (two) times daily. (Patient not taking: Reported on 07/07/2014) 14 tablet 0  . antipyrine-benzocaine (AURALGAN) otic solution Place 3-4 drops into the left ear every 2 (two) hours as needed for ear pain. (Patient not taking: Reported on 07/07/2014) 10 mL 0  . Chlorpheniramine-PSE-Ibuprofen (ADVIL ALLERGY SINUS) 2-30-200 MG TABS 1-2 tabs PO Q4-6 hrs PRN (Patient not taking: Reported on 07/07/2014) 50 each 1  . ciprofloxacin-dexamethasone (CIPRODEX) otic suspension Place 4 drops into the left ear 2 (two) times daily. (Patient not taking: Reported on 07/07/2014) 7.5 mL 0  . fluticasone (FLONASE) 50 MCG/ACT nasal spray Place 2 sprays into both nostrils 2 (two) times daily. (Patient not taking: Reported on 07/07/2014) 16 g 2  . methocarbamol (ROBAXIN) 500 MG tablet Take 1 tablet (500 mg total) by mouth 2 (two) times daily. (Patient not taking: Reported on 07/07/2014) 20 tablet 0  . methylPREDNISolone (MEDROL DOSEPAK) 4 MG tablet follow package directions (Patient not taking: Reported on 07/07/2014) 21 tablet 0  . oxyCODONE-acetaminophen (PERCOCET/ROXICET) 5-325 MG per tablet Take 1 tablet by mouth every 4 (four) hours as needed for pain. (Patient not taking: Reported on 07/07/2014) 10 tablet 0    Musculoskeletal: Strength & Muscle Tone: within normal limits Gait & Station: normal Patient leans: N/A  Psychiatric Specialty Exam: Physical Exam  Review of Systems  Constitutional: Negative.   HENT: Negative.   Eyes: Negative.   Respiratory: Negative.   Cardiovascular: Negative.   Gastrointestinal: Negative.   Genitourinary: Negative.   Musculoskeletal: Negative.   Skin: Negative.   Neurological: Negative.   Endo/Heme/Allergies: Negative.     Blood pressure 122/67, pulse 96, temperature 98.4 F (36.9 C), temperature source Oral, resp. rate 16, last menstrual period  06/26/2014, SpO2 100 %.There is no weight on file to calculate BMI.  General Appearance: Casual and Disheveled  Eye Contact::  Fair  Speech:  Clear and Coherent and Normal Rate  Volume:  Normal  Mood:  Anxious, Depressed, Hopeless, Worthless and helpless  Affect:  Congruent, Flat and Tearful  Thought Process:  Coherent, Goal Directed and Intact  Orientation:  Full (Time, Place, and Person)  Thought Content:  WDL  Suicidal Thoughts:  No  Homicidal Thoughts:  No  Memory:  Immediate;   Good Recent;   Good Remote;   Good  Judgement:  Impaired  Insight:  Shallow  Psychomotor Activity:  Tremor  Concentration:  Fair  Recall:  NA  Fund of Knowledge:Fair  Language: Good  Akathisia:  NA  Handed:  Right  AIMS (if indicated):     Assets:  Desire for Improvement  ADL's:  Impaired  Cognition: WNL  Sleep:      Medical Decision Making: Review of Psycho-Social Stressors (1), Established Problem, Worsening (2), Review of Medication Regimen & Side Effects (2) and Review of New Medication or Change in Dosage (2)  Treatment Plan Summary: Daily contact with patient to assess and evaluate symptoms and progress in treatment and Medication management  Plan:  Transfer to [facility] Disposition:  Admitted to [facility]  with bed assigned, initiated Ativan detox  Protocol for Alcohol.  Delfin Gant   PMHNP-BC 07/07/2014 3:41 PM Patient seen face-to-face for psychiatric evaluation, chart reviewed and case discussed with the physician extender and developed treatment plan. Reviewed the information documented and agree with the treatment plan. Corena Pilgrim, MD

## 2014-07-07 NOTE — ED Notes (Signed)
Patient appears anxious, tearful. Denies SI, HI, AVH. Reports feelings of anxiety at 10/10, feelings of depression 10/10. States that she has been having trouble falling asleep. Reports weight loss of approximately 10 lbs in the last 30 days r/t decreased appetite.   Encouragement offered. Oriented to unit.  Q 15 safety checks in place.

## 2014-07-07 NOTE — ED Provider Notes (Signed)
CSN: [id number]     Arrival date & time 07/06/14  2231 History   First MD Initiated Contact with Patient 07/07/14 0406     Chief Complaint  Patient presents with  . Alcohol Problem     (Consider location/radiation/quality/duration/timing/severity/associated sxs/prior Treatment) Patient is a 29 y.o. female presenting with alcohol problem. The history is provided by the patient and a relative. No language interpreter was used.  Alcohol Problem This is a chronic problem. Pertinent negatives include no chills or fever. Associated symptoms comments: She presents to the ED requesting treatment for alcoholism. No SI/HI, hallucinations. She has never attempted treatment before but feels she needs residential treatment to be successful. No history of DT's or withdrawal seizures. .    Past Medical History  Diagnosis Date  . UTI (urinary tract infection)   . Vaginal trichomoniasis   . History of chicken pox   . Bacterial infection   . HPV (human papilloma virus) anogenital infection   . Dysplasia of cervix, low grade (CIN 1)   . LGSIL (low grade squamous intraepithelial dysplasia)   . Brain cyst   . Depression    Past Surgical History  Procedure Laterality Date  . Skin graft     Family History  Problem Relation Age of Onset  . Diabetes Mother   . Cancer Mother    History  Substance Use Topics  . Smoking status: Current Every Day Smoker -- 0.50 packs/day    Types: Cigarettes  . Smokeless tobacco: Not on file  . Alcohol Use: Yes     Comment: binge drinks   OB History    Gravida Para Term Preterm AB TAB SAB Ectopic Multiple Living   2 1             Review of Systems  Constitutional: Negative for fever and chills.  HENT: Negative.   Respiratory: Negative.   Cardiovascular: Negative.   Gastrointestinal: Negative.   Musculoskeletal: Negative.   Skin: Negative.   Neurological: Negative.   Psychiatric/Behavioral: Positive for dysphoric mood.      Allergies  Review of  patient's allergies indicates no known allergies.  Home Medications   Prior to Admission medications   Medication Sig Start Date End Date Taking? Authorizing Provider  ibuprofen (ADVIL,MOTRIN) 200 MG tablet Take 400 mg by mouth every 6 (six) hours as needed for moderate pain.   Yes Historical Provider, MD  amoxicillin (AMOXIL) 875 MG tablet Take 1 tablet (875 mg total) by mouth 2 (two) times daily. Patient not taking: Reported on 07/07/2014 03/24/13   Graylon GoodZachary H Baker, PA-C  antipyrine-benzocaine Lyla Son(AURALGAN) otic solution Place 3-4 drops into the left ear every 2 (two) hours as needed for ear pain. Patient not taking: Reported on 07/07/2014 08/24/13   Ozella Rocksavid J Merrell, MD  Chlorpheniramine-PSE-Ibuprofen (ADVIL ALLERGY SINUS) 2-30-200 MG TABS 1-2 tabs PO Q4-6 hrs PRN Patient not taking: Reported on 07/07/2014 03/24/13   Graylon GoodZachary H Baker, PA-C  ciprofloxacin-dexamethasone (CIPRODEX) otic suspension Place 4 drops into the left ear 2 (two) times daily. Patient not taking: Reported on 07/07/2014 08/24/13   Ozella Rocksavid J Merrell, MD  fluticasone [facility](FLONASE) 50 MCG/ACT nasal spray Place 2 sprays into both nostrils 2 (two) times daily. Patient not taking: Reported on 07/07/2014 03/24/13   Graylon GoodZachary H Baker, PA-C  methocarbamol (ROBAXIN) 500 MG tablet Take 1 tablet (500 mg total) by mouth 2 (two) times daily. Patient not taking: Reported on 07/07/2014 05/15/12   Garlon HatchetLisa M Sanders, PA-C  methylPREDNISolone (MEDROL DOSEPAK) 4 MG tablet follow  package directions Patient not taking: Reported on 07/07/2014 03/24/13   Graylon Good, PA-C  oxyCODONE-acetaminophen (PERCOCET/ROXICET) 5-325 MG per tablet Take 1 tablet by mouth every 4 (four) hours as needed for pain. Patient not taking: Reported on 07/07/2014 05/15/12   Garlon Hatchet, PA-C   BP 123/82 mmHg  Pulse 103  Temp(Src) 97.9 F (36.6 C) (Oral)  Resp 22  SpO2 97%  LMP 06/26/2014 Physical Exam  Constitutional: She appears well-developed and well-nourished.  Acutely intoxicated but  coherent and able to contribute to history.   HENT:  Head: Normocephalic.  Neck: Normal range of motion. Neck supple.  Cardiovascular: Normal rate and regular rhythm.   Pulmonary/Chest: Effort normal and breath sounds normal.  Abdominal: Soft. Bowel sounds are normal. There is no tenderness. There is no rebound and no guarding.  Musculoskeletal: Normal range of motion.  Neurological: She is alert. No cranial nerve deficit.  Skin: Skin is warm and dry. No rash noted.  Psychiatric: Her speech is normal. She is withdrawn. She exhibits a depressed mood.    ED Course  Procedures (including critical care time) Labs Review Labs Reviewed  CBC WITH DIFFERENTIAL/PLATELET  COMPREHENSIVE METABOLIC PANEL  ETHANOL  URINE RAPID DRUG SCREEN (HOSP PERFORMED) NOT AT [facility]  PREGNANCY, URINE   . Imaging Review No results found.   EKG Interpretation None      MDM   Final diagnoses:  None    1. Alcohol dependence  Will have TTS consult to attempt placement     Elpidio Anis, PA-C 07/07/14 1610  Tomasita Crumble, MD 07/07/14 1409

## 2014-07-07 NOTE — Tx Team (Addendum)
Initial Interdisciplinary Treatment Plan   PATIENT STRESSORS: Legal issue Substance abuse   PATIENT STRENGTHS: Communication skills General fund of knowledge Physical Health Supportive family/friends   PROBLEM LIST: Problem List/Patient Goals Date to be addressed Date deferred Reason deferred Estimated date of resolution  Substance Abuse 07/07/14                 "I want to detox from alcohol and stay sober"                                     DISCHARGE CRITERIA:  Improved stabilization in mood, thinking, and/or behavior Withdrawal symptoms are absent or subacute and managed without 24-hour nursing intervention  PRELIMINARY DISCHARGE PLAN: Attend 12-step recovery group Outpatient therapy Return to previous living arrangement Return to previous work or school arrangements  PATIENT/FAMIILY INVOLVEMENT: This treatment plan has been presented to and reviewed with the patient, Suzanne [name], and/or family member.  The patient and family have been given the opportunity to ask questions and make suggestions.  Leda QuailSmith, Suzanne [name], Suzanne [name]

## 2014-07-07 NOTE — BH Assessment (Signed)
[facility]. Jannifer FranklinAkintayo and Samuel GermanyJospephine, NP. Room assignment 303-2. Nursing report [phone]. Support paperwork completed.

## 2014-07-07 NOTE — Progress Notes (Signed)
Pt attended the evening NA speaker meeting. 

## 2014-07-07 NOTE — ED Notes (Signed)
Patient here for detox from alcohol.  She has been sleeping majority of morning.  No medications given.  [facility].

## 2014-07-07 NOTE — ED Notes (Signed)
Paper chart entered into epic

## 2014-07-07 NOTE — ED Notes (Signed)
Pt is alert and oriented reporting detox symptoms of tremors, anxiety and sensitivity to light. Pt reports that [facility].  Pt denies any si or hi thoughts. Offered support, medications and encouragement. Safety maintained in the [facility].

## 2014-07-07 NOTE — ED Notes (Signed)
Pt states she has a drinking problem and wants help  Pt states she does not drink daily and can go for days at a time with out drinking but then she will drink for several days in a row  Pt states she has 3 DUIs and has been stopped once while on her restricted license  Pt states she got a DUI with her 28 year old daughter in the car with her and has a court date pending later this month  Pt has not had her child living with her since January  Pt denies SI/HI at this time   Pt is tearful in triage

## 2014-07-07 NOTE — Progress Notes (Signed)
Pt is new to the unit this evening here for alcohol detox.  Pt reports mild withdrawal symptoms at this time.  The Ativan detox protocol was discussed with her, and she was informed of the prn medications available to her for her w/d symptoms.  Pt was also encouraged to make her needs and concerns known to staff.  Pt voiced understanding.  Pt attended evening group.  Pt denies SI/HI/AVH at this time.  Support and encouragement offered.  Safety maintained with q15 minute checks.

## 2014-07-07 NOTE — Progress Notes (Signed)
29 year old female admitted for alcohol detox. She drinks about 1 pint-1.5 pints a day of liquor. She has gotten a total of 3 DUI's and lists this as one of her stressors. She recently got a DUI with her daughter in the car. She has 2 upcoming court dates. She denies SI/HI/AVH. Consents signed and pt verbalized understanding. Skin visually assessed and belongings searched. Pt oriented to unit. No complaints of pain or discomfort at this time. Q15 min checks maintained. Will continue to monitor pt.

## 2014-07-08 ENCOUNTER — Encounter (HOSPITAL_COMMUNITY): Payer: Self-pay | Admitting: Registered Nurse

## 2014-07-08 DIAGNOSIS — F331 Major depressive disorder, recurrent, moderate: Secondary | ICD-10-CM | POA: Diagnosis present

## 2014-07-08 MED ORDER — POTASSIUM CHLORIDE CRYS ER 20 MEQ PO TBCR
EXTENDED_RELEASE_TABLET | ORAL | Status: AC
  Filled 2014-07-08: qty 1

## 2014-07-08 MED ORDER — ACAMPROSATE CALCIUM 333 MG PO TBEC
666.0000 mg | DELAYED_RELEASE_TABLET | Freq: Three times a day (TID) | ORAL | Status: DC
  Administered 2014-07-09 – 2014-07-12 (×11): 666 mg via ORAL
  Filled 2014-07-08 (×6): qty 2
  Filled 2014-07-08: qty 84
  Filled 2014-07-08 (×2): qty 2
  Filled 2014-07-08: qty 84
  Filled 2014-07-08: qty 2
  Filled 2014-07-08: qty 84
  Filled 2014-07-08 (×5): qty 2

## 2014-07-08 MED ORDER — CITALOPRAM HYDROBROMIDE 20 MG PO TABS
20.0000 mg | ORAL_TABLET | Freq: Every day | ORAL | Status: DC
  Administered 2014-07-08 – 2014-07-12 (×5): 20 mg via ORAL
  Filled 2014-07-08: qty 14
  Filled 2014-07-08 (×8): qty 1

## 2014-07-08 NOTE — [facility] Group Notes (Signed)
[facility] Group Notes:  Leisure activites  Date:  07/08/2014  Time:  9:27 AM  Type of Therapy:  Nurse Education  Participation Level:  Did Not Attend  Participation Quality:  Inattentive  Affect:  Flat  Cognitive:  Lacking  Insight:  None  Engagement in Group:  None  Modes of Intervention:  Discussion  Summary of Progress/Problems:Pt did not attend  Rodman Key [facility] 07/08/2014, 9:27 AM

## 2014-07-08 NOTE — Progress Notes (Addendum)
Pt does contract for safety and denies SI and HI at this time. Pt stated she is really tired. Pt appears dishelved and stated her withdrawal symptoms are not bad this am. Pt did not attend the am group.Pts aunt called , Estevan Oaks, concerned that if pt is discharged the judge at the court hearing on June 14th  will make her go to jail for 1 year. Spoke with DR. [place] who stated the pt will be started on an antidepressant and will need to go for the court hearings. His plan is to have her go to [facility] following her court hearings. Aunt stated that her daughter and son and law have been taking care of the pts 76 year old daughter since January.The pt. allowed this so that her child would not be taken from her. Pt was made aware her aunt called and that in order for staff to share information the pt must put the aunt on a list for [place] to discuss her medical records. Pt appears very blunted and depressed and flat at times. All of this has been discussed with Dr. Dub Mikes.

## 2014-07-08 NOTE — Progress Notes (Signed)
Pt reports she has been doing fine today.  Her aunt visited tonight and was added to the list of people who can get information about the pt.  Pt denies SI/HI/AVH.  She states that her withdrawal symptoms are mild to moderate this evening.  She chose not to attend karaoke tonight even after the coaxing from Retail banker.  Pt received clothing from her aunt and she chose to take a shower and read.  Pt was started on new meds today.  She makes her needs known to staff.  She is aware that she will need to take care of her court dates before she will be able to get into a treatment program.  Support and encouragement offered.  Safety maintained with q15 minute checks.
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
## 2014-07-08 NOTE — [facility] Group Notes (Signed)
[facility] LCSW Group Therapy 07/08/2014  1:15 pm   Type of Therapy: Group Therapy Participation Level: Active  Participation Quality: Attentive, Sharing and Supportive  Affect: Depressed and Flat  Cognitive: Alert and Oriented  Insight: Developing/Improving and Engaged  Engagement in Therapy: Developing/Improving and Engaged  Modes of Intervention: Clarification, Confrontation, Discussion, Education, Exploration, Limit-setting, Orientation, Problem-solving, Rapport Building, Dance movement psychotherapist, Socialization and Support  Summary of Progress/Problems: The topic for group was balance in life. Today's group focused on defining balance in one's own words, identifying things that can knock one off balance, and exploring healthy ways to maintain balance in life. Group members were asked to provide an example of a time when they felt off balance, describe how they handled that situation,and process healthier ways to regain balance in the future. Group members were asked to share the most important tool for maintaining balance that they learned while at [facility] and how they plan to apply this method after discharge. Patient shared that she would like to find balance in her life by spending more time with her daughter. CSW and other group members provided patient with emotional support and encouragement.  Samuella Bruin, MSW, [employer] Clinical Social Worker [facility] [phone]
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
## 2014-07-09 NOTE — Progress Notes (Signed)
Pt reports she is feeling better this evening now that she knows what the plans are for her.  She will discharge home until her court dates so that she can take care of her legal issues, then she plans to do long term rehab.  She wants to do whatever it takes to get her daughter back.  She denies any withdrawal symptoms at this time.  She still appears flat and depressed.  She makes her needs known to staff, and is med compliant.  She denies SI/HI/AVH.  Support and encouragement offered.  Safety maintained with q15 minute checks.

## 2014-07-09 NOTE — Plan of Care (Signed)
Problem: Consults Goal: Suicide Risk Patient Education (See Patient Education module for education specifics)  Outcome: Completed/Met Date Met:  07/09/14 Nurse discussed suicidal thoughts/coping skills with patient.

## 2014-07-09 NOTE — Progress Notes (Signed)
D:  Patient's self inventory sheet, patient slept good last night, sleep medication is helpful.  Good appetite, normal energy level, good concentration.  Rated depression and anxiety 4, hopeless 1.  Denied withdrawals.  Denied SI.  Denied physical problems.  Denied pain.  Goal is to "just getting out and spending what time I have left with my family before going to inpatient treatment.  Took medicine the doctors prescribed for urges of alcohol.  I would just like to say that I'm feeling better about my situation with long term treatment and I really need to be with my daughter and family." A:  Medications administered per MD orders.  Emotional support and encouragement given patient. R:  Denied SI and HI, contracts for safety.  Denied A/V hallucinations.  Safety maintained with 15 minute checks.

## 2014-07-09 NOTE — [facility] Group Notes (Signed)
[facility] LCSW Group Therapy 07/09/2014 1:15pm  Type of Therapy: Group Therapy- Feelings Around Relapse and Recovery  Participation Level: Minimal  Participation Quality:  Reserved  Affect:  Flat  Cognitive: Alert and Oriented   Insight:  Developing   Engagement in Therapy: Developing/Improving and Engaged   Modes of Intervention: Clarification, Confrontation, Discussion, Education, Exploration, Limit-setting, Orientation, Problem-solving, Rapport Building, Dance movement psychotherapist, Socialization and Support  Summary of Progress/Problems: The topic for today was feelings about relapse. Group discussed what relapse prevention is to them and identified triggers that they are on the path to relapse. Group also processed their feeling towards relapse. Group discussed coping skills that can be used for relapse prevention. Pt did not participate in group discussion and remained reserved throughout session.    Therapeutic Modalities:   Cognitive Behavioral Therapy Solution-Focused Therapy Assertiveness Training Relapse Prevention Therapy    Lamar Sprinkles [phone] 07/09/2014 6:12 PM
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
## 2014-07-09 NOTE — Progress Notes (Signed)
[facility] MD Progress Note  07/09/2014 5:45 PM Suzanne [name]  MRN:  [id number] Subjective:  Suzanne [name] states she is feeling depressed anxious. She is really worried. She has been struggling with depression and alcoholism for a while now. She is dealing with a lot of shame and guilt, lots of regrets. States she wants help. She wants to be sure she does not fall back into the same behavior again. (becomes tearful) Principal Problem: Alcohol use disorder, severe, dependence Diagnosis:   Patient Active Problem List   Diagnosis Date Noted  . 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 Alcohol use disorder, severe, dependence [F10.20] 07/07/2014   Total Time spent with patient: 30 minutes   Past Medical History:  Past Medical History  Diagnosis Date  . UTI (urinary tract infection)   . Vaginal trichomoniasis   . History of chicken pox   . Bacterial infection   . HPV (human papilloma virus) anogenital infection   . Dysplasia of cervix, low grade (CIN 1)   . LGSIL (low grade squamous intraepithelial dysplasia)   . Brain cyst   . Depression     Past Surgical History  Procedure Laterality Date  . Skin graft     Family History:  Family History  Problem Relation Age of Onset  . Diabetes Mother   . Cancer Mother    Social History:  History  Alcohol Use  . Yes    Comment: binge drinks     History  Drug Use  . Yes  . Special: Marijuana    History   Social History  . Marital Status: Single    Spouse Name: N/A  . Number of Children: N/A  . Years of Education: N/A   Social History Main Topics  . Smoking status: Current Every Day Smoker -- 0.50 packs/day    Types: Cigarettes  . Smokeless tobacco: Not on file  . Alcohol Use: Yes     Comment: binge drinks  . Drug Use: Yes    Special: Marijuana  . Sexual Activity: Yes    Birth Control/ Protection: None   Other Topics Concern  . None   Social History Narrative   Additional History:    Sleep:  Fair  Appetite:  Poor   Assessment:   Musculoskeletal: Strength & Muscle Tone: within normal limits Gait & Station: normal Patient leans: normal   Psychiatric Specialty Exam: Physical Exam  Review of Systems  Constitutional: Positive for malaise/fatigue.  HENT: Negative.   Eyes: Negative.   Respiratory: Negative.   Cardiovascular: Negative.   Gastrointestinal: Negative.   Genitourinary: Negative.   Musculoskeletal: Negative.   Skin: Negative.   Neurological: Positive for weakness.  Endo/Heme/Allergies: Negative.   Psychiatric/Behavioral: Positive for depression and substance abuse. The patient is nervous/anxious.     Blood pressure 116/84, pulse 107, temperature 97.9 F (36.6 C), temperature source Oral, resp. rate 20, height 5' 1.5" (1.562 m), weight 52.164 kg (115 lb), last menstrual period 06/26/2014, SpO2 99 %.Body mass index is 21.38 kg/(m^2).  General Appearance: Fairly Groomed  Patent attorney::  Minimal  Speech:  Clear and Coherent, Slow and not spontaneous  Volume:  Decreased  Mood:  Anxious and Depressed  Affect:  Depressed and Tearful  Thought Process:  Coherent and Goal Directed  Orientation:  Full (Time, Place, and Person)  Thought Content:  symptoms events worries concerns  Suicidal Thoughts:  No  Homicidal Thoughts:  No  Memory:  Immediate;   Fair Recent;   Fair Remote;  Fair  Judgement:  Fair  Insight:  Present  Psychomotor Activity:  Restlessness  Concentration:  Fair  Recall:  [employer] of Knowledge:Fair  Language: Fair  Akathisia:  No  Handed:  Right  AIMS (if indicated):     Assets:  Desire for Improvement Social Support  ADL's:  Intact  Cognition: WNL  Sleep:  Number of Hours: 6.75     Current Medications: Current Facility-Administered Medications  Medication Dose Route Frequency Provider Last Rate Last Dose  . acamprosate (CAMPRAL) tablet 666 mg  666 mg Oral TID WC Shuvon B Rankin, NP   666 mg at 07/09/14 1646  . acetaminophen  (TYLENOL) tablet 650 mg  650 mg Oral Q4H PRN Earney Navy, NP      . citalopram (CELEXA) tablet 20 mg  20 mg Oral Daily Shuvon B Rankin, NP   20 mg at 07/09/14 0851  . hydrOXYzine (ATARAX/VISTARIL) tablet 25 mg  25 mg Oral Q6H PRN Earney Navy, NP   25 mg at 07/08/14 2158  . loperamide (IMODIUM) capsule 2-4 mg  2-4 mg Oral PRN Earney Navy, NP      . LORazepam (ATIVAN) tablet 1 mg  1 mg Oral Q6H PRN Earney Navy, NP   1 mg at 07/08/14 2159  . LORazepam (ATIVAN) tablet 1 mg  1 mg Oral BID Earney Navy, NP   1 mg at 07/09/14 1646   Followed by  . [START ON 07/11/2014] LORazepam (ATIVAN) tablet 1 mg  1 mg Oral Daily Earney Navy, NP      . multivitamin with minerals tablet 1 tablet  1 tablet Oral Daily Earney Navy, NP   1 tablet at 07/09/14 0851  . nicotine (NICODERM CQ - dosed in mg/24 hours) patch 21 mg  21 mg Transdermal Daily Earney Navy, NP   21 mg at 07/09/14 0850  . ondansetron (ZOFRAN) tablet 4 mg  4 mg Oral Q8H PRN Earney Navy, NP      . ondansetron (ZOFRAN-ODT) disintegrating tablet 4 mg  4 mg Oral Q6H PRN Earney Navy, NP      . thiamine (VITAMIN B-1) tablet 100 mg  100 mg Oral Daily Earney Navy, NP   100 mg at 07/09/14 0981    Lab Results: No results found for this or any previous visit (from the past 48 hour(s)).  Physical Findings: AIMS: Facial and Oral Movements Muscles of Facial Expression: None, normal Lips and Perioral Area: None, normal Jaw: None, normal Tongue: None, normal,Extremity Movements Upper (arms, wrists, hands, fingers): None, normal Lower (legs, knees, ankles, toes): None, normal, Trunk Movements Neck, shoulders, hips: None, normal, Overall Severity Severity of abnormal movements (highest score from questions above): None, normal Incapacitation due to abnormal movements: None, normal Patient's awareness of abnormal movements (rate only patient's report): No Awareness, Dental Status Current  problems with teeth and/or dentures?: No Does patient usually wear dentures?: No  CIWA:  CIWA-Ar Total: 1 COWS:  COWS Total Score: 3  Treatment Plan Summary: Daily contact with patient to assess and evaluate symptoms and progress in treatment and Medication management Supportive approach/copign skills Depression; will continue the Celexa 20 mg daily and reassess for tolerability Alcohol dependence; will continue to detox with Librium Will work a relapse prevention plan; will use Campral for cravings and Antabuse for adversive therapy Will identify a residential treatment program she can go after she goes to court CBT/mindfulness  Medical Decision Making:  Review of Psycho-Social  Stressors (1), Review of Medication Regimen & Side Effects (2) and Review of New Medication or Change in Dosage (2)     Keylen Eckenrode A 07/09/2014, 5:45 PM

## 2014-07-09 NOTE — [facility] Group Notes (Signed)
[facility] LCSW Aftercare Discharge Planning Group Note  07/09/2014 8:45 AM  Participation Quality: Alert, Appropriate and Oriented  Mood/Affect: Flat Affect  Depression Rating: 2-3  Anxiety Rating: 2-3  Thoughts of Suicide: Pt denies SI/HI  Will you contract for safety? Yes  Current AVH: Pt denies  Plan for Discharge/Comments: Pt attended discharge planning group and actively participated in group. CSW provided pt with today's workbook. Pt presented with flat affect but reported decreased depression and anxiety. Pt verbalized that she wants to go home to see her daughter before her court dates. No other needs expressed at this time.   Transportation Means: Pt reports access to transportation  Supports: No supports mentioned at this time  Chad Cordial, LCSWA 07/09/2014 9:58 AM

## 2014-07-09 NOTE — Progress Notes (Signed)
Recreation Therapy Notes  Date: 06.10.16 Time: 9:30 am Location: [address] Group Room  Group Topic: Stress Management  Goal Area(s) Addresses:  Patient will verbalize importance of using healthy stress management.  Patient will identify positive emotions associated with healthy stress management.   Intervention: Stress Management  Activity :  Progressive Muscle Relaxation.  LRT introduced and educated patients on stress management technique of progressive muscle relaxation.  A script was used to deliver the technique to patients.  Patients were asked to follow script read a loud by LRT to engage in practicing the stress management technique.  Education:  Stress Management, Discharge Planning.   Clinical Observations/Feedback: Patient did not attend group.   Capri Veals, LRT/CTRS         Lucynda Rosano A 07/09/2014 3:18 PM 

## 2014-07-09 NOTE — Progress Notes (Signed)
Pt did not attend karaoke in cafeteria.  

## 2014-07-09 NOTE — [facility] Group Notes (Signed)
[facility] Group Notes:  (Nursing/MHT/Case Management/Adjunct)  Date:  07/09/2014  Time:  10:39 AM  Type of Therapy:  Psychoeducational Skills  Participation Level:  Active  Participation Quality:  Appropriate and Attentive  Affect:  Appropriate  Cognitive:  Alert and Appropriate  Insight:  Good  Engagement in Group:  Engaged  Modes of Intervention:  Education  Summary of Progress/Problems: Pt. Confirmed intentions to avoid relapse.  Pt. Identified forms of coping skills, such as playing with her children.  Elmore Guise N 07/09/2014, 10:39 AM

## 2014-07-10 DIAGNOSIS — F1020 Alcohol dependence, uncomplicated: Secondary | ICD-10-CM

## 2014-07-10 MED ORDER — TRAZODONE HCL 50 MG PO TABS
50.0000 mg | ORAL_TABLET | Freq: Every evening | ORAL | Status: DC | PRN
  Administered 2014-07-10: 50 mg via ORAL
  Filled 2014-07-10: qty 1

## 2014-07-10 NOTE — Plan of Care (Signed)
Problem: Alteration in mood & ability to function due to Goal: STG: Patient verbalizes decreases in signs of withdrawal Outcome: Completed/Met Date Met:  07/10/14 Pt denies any withdrawal symptoms.

## 2014-07-10 NOTE — [facility] Group Notes (Signed)
[facility] Group Notes:  (Nursing/MHT/Case Management/Adjunct)  Date:  07/10/2014  Time:  9:36 AM  Type of Therapy:  Goals Group  Participation Level:  Did Not Attend  Participation Quality:  none  Affect:  Did not attend  Cognitive:  None  Insight:  None  Engagement in Group:  none  Modes of Intervention:  none  Summary of Progress/Problems: Pt declined to attend group  Renaee Munda 07/10/2014, 9:36 AM

## 2014-07-10 NOTE — Progress Notes (Signed)
Patient ID: Suzanne [name], female   DOB: Feb 20, 1985, 29 y.o.   MRN: [id number]  D: Patient with dull, flat affect endorsing depression. Pt remained in her room for the majority of the shift this am. Pt declined to go to group session stating she was tired. A: Q 15 minute safety checks, encourage group participation, administer medications as ordered by MD. R: Patient compliant with medications. Pt denies SI/HI or plans to harm herself at this time. No s/s of distress noted.

## 2014-07-10 NOTE — Progress Notes (Signed)
The focus of this group is to help patients review their daily goal of treatment and discuss progress on daily workbooks. Pt attended the evening group session and responded to all discussion prompts from the Writer. Pt shared that today was a good day on the unit, the highlight of which was getting to talk to her eight-year-old on the phone. "I was happy that I didn't break down talking to her. I did good." Pt's only additional request from Nursing Staff this evening was for towels, which were given to her following group. Suzanne [name] also expressed her gratitude for several newly made friends on the hallway, whom she felt were supportive. Pt's affect was appropriate.

## 2014-07-10 NOTE — Progress Notes (Signed)
Patient ID: Suzanne [name], female   DOB: Jan 09, 1986, 29 y.o.   MRN: [id number] D: Client visible on the unit, in dayroom watching TV, interacts with staff and peers appropriately. Client reports "feeling better with Celexa", anxiety "4" of 10. Client complains of "tossing and turning the last two nights, need something better for sleep" A: Writer introduced self to client, contacted NP for Trazodone for sleep. Staff will monitor q52min for safety. R: Client is safe on the unit, attended group.

## 2014-07-10 NOTE — Progress Notes (Signed)
Patient ID: Suzanne [name], female   DOB: Jan 11, 1986, 29 y.o.   MRN: [id number] [facility] MD Progress Note  07/10/2014 3:34 PM Suzanne [name]  MRN:  [id number]  Subjective:  Patient states that she is doing well.  She states plans for outpt follow up and AA meetings to attend after her discharge here. She still is anxious but not as worried today. She has been struggling with depression and alcoholism for a while now.  Objective:  She continues to improve.  Denies withdrawal symptoms.  Detox protocol without event.  Principal Problem: Alcohol use disorder, severe, dependence Diagnosis:   Patient Active Problem List   Diagnosis Date Noted  . 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 Alcohol use disorder, severe, dependence [F10.20] 07/07/2014   Total Time spent with patient: 30 minutes   Past Medical History:  Past Medical History  Diagnosis Date  . UTI (urinary tract infection)   . Vaginal trichomoniasis   . History of chicken pox   . Bacterial infection   . HPV (human papilloma virus) anogenital infection   . Dysplasia of cervix, low grade (CIN 1)   . LGSIL (low grade squamous intraepithelial dysplasia)   . Brain cyst   . Depression     Past Surgical History  Procedure Laterality Date  . Skin graft     Family History:  Family History  Problem Relation Age of Onset  . Diabetes Mother   . Cancer Mother    Social History:  History  Alcohol Use  . Yes    Comment: binge drinks     History  Drug Use  . Yes  . Special: Marijuana    History   Social History  . Marital Status: Single    Spouse Name: N/A  . Number of Children: N/A  . Years of Education: N/A   Social History Main Topics  . Smoking status: Current Every Day Smoker -- 0.50 packs/day    Types: Cigarettes  . Smokeless tobacco: Not on file  . Alcohol Use: Yes     Comment: binge drinks  . Drug Use: Yes    Special: Marijuana  . Sexual Activity: Yes    Birth Control/ Protection:  None   Other Topics Concern  . None   Social History Narrative   Additional History:    Sleep: Fair  Appetite:  Poor   Assessment:  [name] is a 29 year old patient who came in with ETOH abuse.    Musculoskeletal: Strength & Muscle Tone: within normal limits Gait & Station: normal Patient leans: normal   Psychiatric Specialty Exam: Physical Exam  Review of Systems  Constitutional: Positive for malaise/fatigue.  HENT: Negative.   Eyes: Negative.   Respiratory: Negative.   Cardiovascular: Negative.   Gastrointestinal: Negative.   Genitourinary: Negative.   Musculoskeletal: Negative.   Skin: Negative.   Neurological: Positive for weakness.  Endo/Heme/Allergies: Negative.   Psychiatric/Behavioral: Positive for depression and substance abuse. The patient is nervous/anxious.     Blood pressure 115/80, pulse 100, temperature 97.9 F (36.6 C), temperature source Oral, resp. rate 15, height 5' 1.5" (1.562 m), weight 52.164 kg (115 lb), last menstrual period 06/26/2014, SpO2 99 %.Body mass index is 21.38 kg/(m^2).  General Appearance: Fairly Groomed  Patent attorney::  Minimal  Speech:  Clear and Coherent, Slow and not spontaneous  Volume:  Decreased  Mood:  Anxious and Depressed  Affect:  Depressed and Tearful  Thought Process:  Coherent and Goal Directed  Orientation:  Full (Time, Place, and Person)  Thought Content:  symptoms events worries concerns  Suicidal Thoughts:  No  Homicidal Thoughts:  No  Memory:  Immediate;   Fair Recent;   Fair Remote;   Fair  Judgement:  Fair  Insight:  Present  Psychomotor Activity:  Restlessness  Concentration:  Fair  Recall:  [employer] of Knowledge:Fair  Language: Fair  Akathisia:  No  Handed:  Right  AIMS (if indicated):     Assets:  Desire for Improvement Social Support  ADL's:  Intact  Cognition: WNL  Sleep:  Number of Hours: 6.75     Current Medications: Current Facility-Administered Medications  Medication Dose  Route Frequency Provider Last Rate Last Dose  . acamprosate (CAMPRAL) tablet 666 mg  666 mg Oral TID WC Shuvon B Rankin, NP   666 mg at 07/10/14 1250  . acetaminophen (TYLENOL) tablet 650 mg  650 mg Oral Q4H PRN Earney Navy, NP      . citalopram (CELEXA) tablet 20 mg  20 mg Oral Daily Shuvon B Rankin, NP   20 mg at 07/10/14 0818  . [START ON 07/11/2014] LORazepam (ATIVAN) tablet 1 mg  1 mg Oral Daily Earney Navy, NP      . multivitamin with minerals tablet 1 tablet  1 tablet Oral Daily Earney Navy, NP   1 tablet at 07/10/14 0818  . nicotine (NICODERM CQ - dosed in mg/24 hours) patch 21 mg  21 mg Transdermal Daily Earney Navy, NP   21 mg at 07/10/14 0817  . ondansetron (ZOFRAN) tablet 4 mg  4 mg Oral Q8H PRN Earney Navy, NP      . thiamine (VITAMIN B-1) tablet 100 mg  100 mg Oral Daily Earney Navy, NP   100 mg at 07/10/14 0818    Lab Results: No results found for this or any previous visit (from the past 48 hour(s)).  Physical Findings: AIMS: Facial and Oral Movements Muscles of Facial Expression: None, normal Lips and Perioral Area: None, normal Jaw: None, normal Tongue: None, normal,Extremity Movements Upper (arms, wrists, hands, fingers): None, normal Lower (legs, knees, ankles, toes): None, normal, Trunk Movements Neck, shoulders, hips: None, normal, Overall Severity Severity of abnormal movements (highest score from questions above): None, normal Incapacitation due to abnormal movements: None, normal Patient's awareness of abnormal movements (rate only patient's report): No Awareness, Dental Status Current problems with teeth and/or dentures?: No Does patient usually wear dentures?: No  CIWA:  CIWA-Ar Total: 0 COWS:  COWS Total Score: 3  Treatment Plan Summary: Daily contact with patient to assess and evaluate symptoms and progress in treatment and Medication management Supportive approach/copign skills Depression; will continue the Celexa  20 mg daily and reassess for tolerability Alcohol dependence; will continue to detox with Librium Will work a relapse prevention plan; will use Campral for cravings and Antabuse for adversive therapy Will identify a residential treatment program she can go after she goes to court CBT/mindfulness  Medical Decision Making:  Review of Psycho-Social Stressors (1), Review of Medication Regimen & Side Effects (2) and Review of New Medication or Change in Dosage (2)     Velna Hatchet May Antoria Lanza 07/10/2014, 3:34 PM

## 2014-07-10 NOTE — [facility] Group Notes (Signed)
[facility] Group Notes: (Clinical Social Work)   07/10/2014      Type of Therapy:  Group Therapy   Participation Level:  Did Not Attend despite MHT prompting   Ambrose Mantle, LCSW 07/10/2014, 12:31 PM

## 2014-07-10 NOTE — Plan of Care (Signed)
Problem: Alteration in mood & ability to function due to Goal: LTG-Pt reports reduction in suicidal thoughts (Patient reports reduction in suicidal thoughts and is able to verbalize a safety plan for whenever patient is feeling suicidal)  Outcome: Completed/Met Date Met:  07/10/14 Pt denies suicidal ideation.

## 2014-07-11 MED ORDER — TRAZODONE HCL 100 MG PO TABS
100.0000 mg | ORAL_TABLET | Freq: Every evening | ORAL | Status: DC | PRN
  Administered 2014-07-11: 100 mg via ORAL
  Filled 2014-07-11: qty 1
  Filled 2014-07-11: qty 14

## 2014-07-11 MED ORDER — HYDROXYZINE HCL 50 MG PO TABS
50.0000 mg | ORAL_TABLET | Freq: Every evening | ORAL | Status: DC | PRN
  Administered 2014-07-11 (×2): 50 mg via ORAL
  Filled 2014-07-11 (×6): qty 1

## 2014-07-11 NOTE — Progress Notes (Signed)
D:  Patient's self inventory sheet, patient has poor sleep, sleep medication not helpful.  Good appetite, normal energy level, good concentration.  Denied depression and hopeless.  Rated anxiety 3.  Denied withdrawals.  Denied SI.  Denied physical problems.  Denied pain.  Goal it "to get released tomorrow and start on my AA meetings and to find me a good role model that's been through the same.  Go to the AA meetings that is set up here.  Didn't sleep well last night took something new and did not work couldn't fall into a deep sleep."  Does have discharge plans.   A:  Medications administered per MD orders.  Emotional support and encouragement given patient.   R:  Denied SI and HI, contracts for safety.  Denied A/V hallucinations.  Safety maintained with 15 minute checks.

## 2014-07-11 NOTE — [facility] Group Notes (Signed)
[facility] Group Notes:  (Clinical Social Work)  07/11/2014  10:00-11:00AM  Summary of Progress/Problems:   The main focus of today's process group was to   1)  discuss the importance of adding supports  2)  define health supports versus unhealthy supports  3)  identify the patient's current unhealthy supports and plan how to handle them  4)  Identify the patient's current healthy supports and plan what to add.  An emphasis was placed on using counselor, doctor, therapy groups, 12-step groups, and problem-specific support groups to expand supports.    The patient expressed full comprehension of the concepts presented, and agreed that there is a need to add more supports.  The patient stated little during group, said she is a quiet person normally, and indicated a willingness to grow her supports and go to follow-up appointments.  Type of Therapy:  Process Group with Motivational Interviewing  Participation Level:  Active  Participation Quality:  Attentive  Affect:  Anxious and Blunted  Cognitive:  Alert  Insight:  Developing/Improving  Engagement in Therapy:  Engaged  Modes of Intervention:   Education, Support and Processing, Activity  Ambrose Mantle, LCSW 07/11/2014

## 2014-07-11 NOTE — [facility] Group Notes (Signed)
The focus of this group is to educate the patient on the purpose and policies of crisis stabilization and provide a format to answer questions about their admission.  The group details unit policies and expectations of patients while admitted.  Patient did not attend 0900 nurse education orientation this morning.  Patient stayed in bed.  

## 2014-07-11 NOTE — Progress Notes (Signed)
Patient ID: Suzanne [name], female   DOB: 03-05-1985, 29 y.o.   MRN: [id number] Patient ID: SYLINA HENION, female   DOB: 09-20-1985, 29 y.o.   MRN: [id number] [facility] MD Progress Note  07/11/2014 10:16 AM DAVIN MURAMOTO  MRN:  [id number]  Subjective:  Patient states that she is doing well.  "When eill I get to go home?" Objective:  She continues to improve.  Denies withdrawal symptoms.  Detox protocol without event.  She is asking for if referrals were already in place for her outpatient treatments.  States that Trazodone is not as effective and she was unable to sleep.  Principal Problem: Alcohol use disorder, severe, dependence Diagnosis:   Patient Active Problem List   Diagnosis Date Noted  . 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 Alcohol use disorder, severe, dependence [F10.20] 07/07/2014   Total Time spent with patient: 30 minutes   Past Medical History:  Past Medical History  Diagnosis Date  . UTI (urinary tract infection)   . Vaginal trichomoniasis   . History of chicken pox   . Bacterial infection   . HPV (human papilloma virus) anogenital infection   . Dysplasia of cervix, low grade (CIN 1)   . LGSIL (low grade squamous intraepithelial dysplasia)   . Brain cyst   . Depression     Past Surgical History  Procedure Laterality Date  . Skin graft     Family History:  Family History  Problem Relation Age of Onset  . Diabetes Mother   . Cancer Mother    Social History:  History  Alcohol Use  . Yes    Comment: binge drinks     History  Drug Use  . Yes  . Special: Marijuana    History   Social History  . Marital Status: Single    Spouse Name: N/A  . Number of Children: N/A  . Years of Education: N/A   Social History Main Topics  . Smoking status: Current Every Day Smoker -- 0.50 packs/day    Types: Cigarettes  . Smokeless tobacco: Not on file  . Alcohol Use: Yes     Comment: binge drinks  . Drug Use: Yes    Special:  Marijuana  . Sexual Activity: Yes    Birth Control/ Protection: None   Other Topics Concern  . None   Social History Narrative   Additional History:    Sleep: Fair  Appetite:  Poor   Assessment:  Rossetta is a 29 year old patient who came in with ETOH abuse.    Musculoskeletal: Strength & Muscle Tone: within normal limits Gait & Station: normal Patient leans: normal   Psychiatric Specialty Exam: Physical Exam  Review of Systems  Constitutional: Positive for malaise/fatigue.  HENT: Negative.   Eyes: Negative.   Respiratory: Negative.   Cardiovascular: Negative.   Gastrointestinal: Negative.   Genitourinary: Negative.   Musculoskeletal: Negative.   Skin: Negative.   Neurological: Positive for weakness.  Endo/Heme/Allergies: Negative.   Psychiatric/Behavioral: Positive for depression and substance abuse. The patient is nervous/anxious.     Blood pressure 116/89, pulse 113, temperature 97.9 F (36.6 C), temperature source Oral, resp. rate 16, height 5' 1.5" (1.562 m), weight 52.164 kg (115 lb), last menstrual period 06/26/2014, SpO2 99 %.Body mass index is 21.38 kg/(m^2).  General Appearance: Fairly Groomed  Patent attorney::  Minimal  Speech:  Clear and Coherent, Slow and not spontaneous  Volume:  Decreased  Mood:  Anxious and Depressed  Affect:  Depressed and Tearful  Thought Process:  Coherent and Goal Directed  Orientation:  Full (Time, Place, and Person)  Thought Content:  symptoms events worries concerns  Suicidal Thoughts:  No  Homicidal Thoughts:  No  Memory:  Immediate;   Fair Recent;   Fair Remote;   Fair  Judgement:  Fair  Insight:  Present  Psychomotor Activity:  Restlessness  Concentration:  Fair  Recall:  [employer] of Knowledge:Fair  Language: Fair  Akathisia:  No  Handed:  Right  AIMS (if indicated):     Assets:  Desire for Improvement Social Support  ADL's:  Intact  Cognition: WNL  Sleep:  Number of Hours: 6.75     Current  Medications: Current Facility-Administered Medications  Medication Dose Route Frequency Provider Last Rate Last Dose  . acamprosate (CAMPRAL) tablet 666 mg  666 mg Oral TID WC Shuvon B Rankin, NP   666 mg at 07/11/14 0620  . acetaminophen (TYLENOL) tablet 650 mg  650 mg Oral Q4H PRN Earney Navy, NP   650 mg at 07/10/14 1642  . citalopram (CELEXA) tablet 20 mg  20 mg Oral Daily Shuvon B Rankin, NP   20 mg at 07/11/14 0805  . hydrOXYzine (ATARAX/VISTARIL) tablet 50 mg  50 mg Oral QHS,MR X 1 Adonis Brook, NP      . multivitamin with minerals tablet 1 tablet  1 tablet Oral Daily Earney Navy, NP   1 tablet at 07/11/14 0805  . nicotine (NICODERM CQ - dosed in mg/24 hours) patch 21 mg  21 mg Transdermal Daily Earney Navy, NP   21 mg at 07/11/14 0806  . ondansetron (ZOFRAN) tablet 4 mg  4 mg Oral Q8H PRN Earney Navy, NP      . thiamine (VITAMIN B-1) tablet 100 mg  100 mg Oral Daily Earney Navy, NP   100 mg at 07/11/14 0806  . traZODone (DESYREL) tablet 100 mg  100 mg Oral QHS PRN Adonis Brook, NP        Lab Results: No results found for this or any previous visit (from the past 48 hour(s)).  Physical Findings: AIMS: Facial and Oral Movements Muscles of Facial Expression: None, normal Lips and Perioral Area: None, normal Jaw: None, normal Tongue: None, normal,Extremity Movements Upper (arms, wrists, hands, fingers): None, normal Lower (legs, knees, ankles, toes): None, normal, Trunk Movements Neck, shoulders, hips: None, normal, Overall Severity Severity of abnormal movements (highest score from questions above): None, normal Incapacitation due to abnormal movements: None, normal Patient's awareness of abnormal movements (rate only patient's report): No Awareness, Dental Status Current problems with teeth and/or dentures?: No Does patient usually wear dentures?: No  CIWA:  CIWA-Ar Total: 1 COWS:  COWS Total Score: 3  Treatment Plan Summary: Daily  contact with patient to assess and evaluate symptoms and progress in treatment and Medication management Supportive approach/copign skills Depression; will continue the Celexa 20 mg daily and reassess for tolerability Alcohol dependence; will continue to detox with Librium Added Vistaril 50 mg QHS insomnia Increased Trazodone to 100 mg PRN QHS Will work a relapse prevention plan; will use Campral for cravings and Antabuse for adversive therapy Will identify a residential treatment program she can go after she goes to court CBT/mindfulness  Medical Decision Making:  Review of Psycho-Social Stressors (1), Review of Medication Regimen & Side Effects (2) and Review of New Medication or Change in Dosage (2)  Velna Hatchet May Ansley Mangiapane AGNP-BC 07/11/2014, 10:16 AM

## 2014-07-11 NOTE — Plan of Care (Signed)
Problem: Ineffective individual coping Goal: LTG: Patient will report a decrease in negative feelings Outcome: Completed/Met Date Met:  07/11/14 Nurse discussed depression/coping skills with patient.

## 2014-07-11 NOTE — Progress Notes (Signed)
Patient did attend the evening speaker AA meeting.  

## 2014-07-12 MED ORDER — DISULFIRAM 250 MG PO TABS
250.0000 mg | ORAL_TABLET | Freq: Every day | ORAL | Status: DC
  Administered 2014-07-12: 250 mg via ORAL
  Filled 2014-07-12: qty 1
  Filled 2014-07-12: qty 14
  Filled 2014-07-12 (×2): qty 1

## 2014-07-12 MED ORDER — CITALOPRAM HYDROBROMIDE 20 MG PO TABS: 20.0000 mg | ORAL_TABLET | Freq: Every day | ORAL | Status: DC

## 2014-07-12 MED ORDER — NICOTINE 21 MG/24HR TD PT24: 21.0000 mg | MEDICATED_PATCH | Freq: Every day | TRANSDERMAL | Status: DC

## 2014-07-12 MED ORDER — TRAZODONE HCL 100 MG PO TABS: 100.0000 mg | ORAL_TABLET | Freq: Every evening | ORAL | Status: DC | PRN

## 2014-07-12 MED ORDER — DISULFIRAM 250 MG PO TABS: 250.0000 mg | ORAL_TABLET | Freq: Every day | ORAL | Status: DC

## 2014-07-12 MED ORDER — ACAMPROSATE CALCIUM 333 MG PO TBEC
666.0000 mg | DELAYED_RELEASE_TABLET | Freq: Three times a day (TID) | ORAL | Status: DC
Start: 2014-07-12 — End: 2015-02-03

## 2014-07-12 NOTE — BHH Suicide Risk Assessment (Signed)
[facility] Discharge Suicide Risk Assessment   Demographic Factors:  Caucasian  Total Time spent with patient: 30 minutes  Musculoskeletal: Strength & Muscle Tone: within normal limits Gait & Station: normal Patient leans: N/A  Psychiatric Specialty Exam: Physical Exam  Review of Systems  Constitutional: Negative.   HENT: Negative.   Eyes: Negative.   Respiratory: Negative.   Cardiovascular: Negative.   Gastrointestinal: Negative.   Genitourinary: Negative.   Musculoskeletal: Negative.   Skin: Negative.   Neurological: Negative.   Endo/Heme/Allergies: Negative.   Psychiatric/Behavioral: Positive for substance abuse. The patient is nervous/anxious.     Blood pressure 125/84, pulse 94, temperature 97.8 F (36.6 C), temperature source Oral, resp. rate 16, height 5' 1.5" (1.562 m), weight 52.164 kg (115 lb), last menstrual period 06/26/2014, SpO2 99 %.Body mass index is 21.38 kg/(m^2).  General Appearance: Fairly Groomed  Patent attorney::  Fair  Speech:  Clear and Coherent409  Volume:  Normal  Mood:  Anxious  Affect:  Appropriate  Thought Process:  Coherent and Goal Directed  Orientation:  Full (Time, Place, and Person)  Thought Content:  plans as she moves on, relapse prevention plan  Suicidal Thoughts:  No  Homicidal Thoughts:  No  Memory:  Immediate;   Fair Recent;   Fair Remote;   Fair  Judgement:  Fair  Insight:  Present  Psychomotor Activity:  Normal  Concentration:  Fair  Recall:  [employer] of Knowledge:Fair  Language: Fair  Akathisia:  No  Handed:  Right  AIMS (if indicated):     Assets:  Desire for Improvement Housing Social Support  Sleep:  Number of Hours: 6.75  Cognition: WNL  ADL's:  Intact   Have you used any form of tobacco in the last 30 days? (Cigarettes, Smokeless Tobacco, Cigars, and/or Pipes): Yes  Has this patient used any form of tobacco in the last 30 days? (Cigarettes, Smokeless Tobacco, Cigars, and/or Pipes) Yes, Prescription not provided  because: given nicotine patches  Mental Status Per Nursing Assessment::   On Admission:  NA  Current Mental Status by Physician: IN full contact with reality. There are no active S/S of withdrawal. There are no active SI plans or intent. She is going to pursue further work on her depression and her sobriety.    Loss Factors: Legal issues  Historical Factors: Impulsivity  Risk Reduction Factors:   Responsible for children under 66 years of age, Sense of responsibility to family, Living with another person, especially a relative and Positive social support  Continued Clinical Symptoms:  Depression:   Comorbid alcohol abuse/dependence Alcohol/Substance Abuse/Dependencies  Cognitive Features That Contribute To Risk:  Closed-mindedness, Polarized thinking and Thought constriction (tunnel vision)    Suicide Risk:  Minimal: No identifiable suicidal ideation.  Patients presenting with no risk factors but with morbid ruminations; may be classified as minimal risk based on the severity of the depressive symptoms  Principal Problem: Alcohol use disorder, severe, dependence Discharge Diagnoses:  Patient Active Problem List   Diagnosis Date Noted  . 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 Alcohol use disorder, severe, dependence [F10.20] 07/07/2014    Follow-up Information    Follow up with [facility].   Why:  Please follow-up with Melissa at discharge to check on your status on the wait list.    Contact information:   [address] [place], [place] [address] Phone: [phone]      Follow up with [facility] On 07/14/2014.   Why:  at 8:00am for an  initial screening. Please bring your ID, Social Security card, and a copy of your Medicaid card. Also, please come prepared to stay for treatment if you meet the criteria for admission.    Contact information:   [address] [place], [place] [address] Phone: [phone]      Plan Of Care/Follow-up  recommendations:  Activity:  as tolerated Diet:  regular  Will go to AA while waiting for a bed at [facility] Continue to use the Celexa for depression, Campral for cravings, Antabuse to help to accomplish sobriety by having to avoid using alcohol not to get sick Is patient on multiple antipsychotic therapies at discharge:  No   Has Patient had three or more failed trials of antipsychotic monotherapy by history:  No  Recommended Plan for Multiple Antipsychotic Therapies: NA    Neria Procter A 07/12/2014, 1:17 PM
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
## 2014-07-12 NOTE — Progress Notes (Addendum)
D:  Patient denied SI and HI, contracts for safety.  Denied A/V hallucinations.  Denied pain. A:  Medications administered per MD orders.  Emotional support and encouragement given patient. R:  Patient stated she is ready to be discharged today.  Court date is Tuesday, needs to have screening at [facility].  Stated she is determined to do better for her daughter who she wants to see asap.  Safety maintained with 15 minute checks. Patient's self inventory sheet, patient slept good last night, sleep medication is helpful.  Good appetite, normal energy level, good concentration.  Denied depression, hopeless, anxiety.  Denied withdrawals.  Denied SI.  Denied physical problems.  Denied pain.  Goal is "to get in touch with [facility] to see if they have a bed available.  I will call to set up appointment for a screening.  Just wanted to say thank you for all your help to getting me on the right path to success."

## 2014-07-12 NOTE — BHH Suicide Risk Assessment (Signed)
[facility] INPATIENT:  Family/Significant Other Suicide Prevention Education  Suicide Prevention Education:  Patient Refusal for Family/Significant Other Suicide Prevention Education: The patient Suzanne [name] has refused to provide written consent for family/significant other to be provided Family/Significant Other Suicide Prevention Education during admission and/or prior to discharge.  Physician notified.  Elaina Hoops 07/12/2014, 8:46 AM

## 2014-07-12 NOTE — Progress Notes (Signed)
Discharge Note:  Patient discharged home with money for bus ticket.  Denied SI and HI.  Denied A/V hallucinations.  Denied pain.  Suicide prevention information given and discussed with patient who stated she understood and had no questions.  Patient stated she received all her belongings, clothing, toiletries, misc items, prescriptions, medications, etc.  Patient stated she appreciated all assistance received from [facility] staff.

## 2014-07-12 NOTE — Progress Notes (Signed)
D. Pt had been up and visible this evening, did attend evening group activity. Pt spoke about having difficulties with sleep and reported she was unable to sleep well last night and spoke about an increase in trazodone. Pt also spoke about getting discharged tomorrow and feels ready for discharge. Pt received medications without incident. A. Support and encouragement provided. R. Safety maintained, will continue to monitor.

## 2014-07-12 NOTE — BHH Group Notes (Signed)
[facility] LCSW Aftercare Discharge Planning Group Note  07/12/2014 8:45 AM  Participation Quality: Alert, Appropriate and Oriented  Mood/Affect: Appropriate  Depression Rating: 0  Anxiety Rating: 0  Thoughts of Suicide: Pt denies SI/HI  Will you contract for safety? Yes  Current AVH: Pt denies  Plan for Discharge/Comments: Pt attended discharge planning group and actively participated in group. CSW discussed suicide prevention education with the group and encouraged them to discuss discharge planning and any relevant barriers. Pt described feeling ready to be discharged today. She reports that she will be going to live with her sister at discharge while awaiting court dates and treatment bed availability at [facility] and [facility].   Transportation Means: Pt reports access to transportation  Supports: Sister  Chad Cordial, Theresia Majors 07/12/2014 9:24 AM

## 2014-07-12 NOTE — Progress Notes (Signed)
  [facility] Adult Case Management Discharge Plan :  Will you be returning to the same living situation after discharge:  No. Pt is going to live with her sister in [place] At discharge, do you have transportation home?: Yes,  Pt sister to provide transportation Do you have the ability to pay for your medications: Yes,  Pt provided with samples and prescriptions  Release of information consent forms completed and in the chart;  Patient's signature needed at discharge.  Patient to Follow up at: Follow-up Information    Follow up with [facility].   Why:  Please follow-up with Melissa at discharge to check on your status on the wait list.    Contact information:   [address] [place], [place] [address] Phone: [phone]      Follow up with [facility] On 07/14/2014.   Why:  at 8:00am for an initial screening. Please bring your ID, Social Security card, and a copy of your Medicaid card. Also, please come prepared to stay for treatment if you meet the criteria for admission.    Contact information:   [address] [place], [place] [address] Phone: [phone]      Patient denies SI/HI: Yes,  Pt denies  Safety Planning and Suicide Prevention discussed: Yes,  with Pt. Declined family contact  Have you used any form of tobacco in the last 30 days? (Cigarettes, Smokeless Tobacco, Cigars, and/or Pipes): Yes  Has patient been referred to the Quitline?: N/A patient is not a smoker  Elaina Hoops 07/12/2014, 10:30 AM

## 2014-07-12 NOTE — Progress Notes (Signed)
Recreation Therapy Notes  Date: 06.13.16 Time: 9:30 am Location: [address] Group Room  Group Topic: Stress Management  Goal Area(s) Addresses:  Patient will verbalize importance of using healthy stress management.  Patient will identify positive emotions associated with healthy stress management.   Intervention: Stress Management  Activity : Guided Imagery. LRT introduced and educated patients on stress management technique of guided imagery. [name] script was used to deliver the techniques to patients. Patients were asked to follow script real aloud by LRT to engage in practicing guided imagery.  Education: Stress Management, Discharge Planning.   Clinical Observations/Feedback: Patient did not attend group.    Jayme Cham, LRT/CTRS        Suzanne [name] 07/12/2014 1:25 PM 

## 2014-07-12 NOTE — Progress Notes (Signed)
Per Pt request, referrals to [facility] and [facility] were made. The following documents were faxed: demographics, H&P, labs, vital signs, and medication list.  Chad Cordial, LCSWA [phone]

## 2015-02-03 ENCOUNTER — Encounter: Payer: Self-pay | Admitting: Emergency Medicine

## 2015-02-03 ENCOUNTER — Emergency Department
Admission: EM | Admit: 2015-02-03 | Discharge: 2015-02-03 | Disposition: A | Discharge status: Home / Self Care | Payer: Medicaid Other | Attending: Emergency Medicine | Admitting: Emergency Medicine

## 2015-02-03 ENCOUNTER — Emergency Department: Payer: Medicaid Other

## 2015-02-03 DIAGNOSIS — S60511A Abrasion of right hand, initial encounter: Secondary | ICD-10-CM | POA: Diagnosis not present

## 2015-02-03 DIAGNOSIS — F1310 Sedative, hypnotic or anxiolytic abuse, uncomplicated: Secondary | ICD-10-CM | POA: Insufficient documentation

## 2015-02-03 DIAGNOSIS — F1210 Cannabis abuse, uncomplicated: Secondary | ICD-10-CM | POA: Insufficient documentation

## 2015-02-03 DIAGNOSIS — Y9389 Activity, other specified: Secondary | ICD-10-CM | POA: Diagnosis not present

## 2015-02-03 DIAGNOSIS — R569 Unspecified convulsions: Secondary | ICD-10-CM | POA: Diagnosis not present

## 2015-02-03 DIAGNOSIS — F17210 Nicotine dependence, cigarettes, uncomplicated: Secondary | ICD-10-CM | POA: Diagnosis not present

## 2015-02-03 DIAGNOSIS — W1839XA Other fall on same level, initial encounter: Secondary | ICD-10-CM | POA: Insufficient documentation

## 2015-02-03 DIAGNOSIS — Y9289 Other specified places as the place of occurrence of the external cause: Secondary | ICD-10-CM | POA: Diagnosis not present

## 2015-02-03 DIAGNOSIS — Y998 Other external cause status: Secondary | ICD-10-CM | POA: Diagnosis not present

## 2015-02-03 DIAGNOSIS — Z3202 Encounter for pregnancy test, result negative: Secondary | ICD-10-CM | POA: Diagnosis not present

## 2015-02-03 LAB — URINE DRUG SCREEN, QUALITATIVE (ARMC ONLY)
Amphetamines, Ur Screen: NOT DETECTED
BARBITURATES, UR SCREEN: NOT DETECTED
BENZODIAZEPINE, UR SCRN: POSITIVE — AB
CANNABINOID 50 NG, UR ~~LOC~~: POSITIVE — AB
COCAINE METABOLITE, UR ~~LOC~~: NOT DETECTED
MDMA (Ecstasy)Ur Screen: NOT DETECTED
METHADONE SCREEN, URINE: NOT DETECTED
OPIATE, UR SCREEN: NOT DETECTED
PHENCYCLIDINE (PCP) UR S: NOT DETECTED
Tricyclic, Ur Screen: NOT DETECTED

## 2015-02-03 LAB — URINALYSIS COMPLETE WITH MICROSCOPIC (ARMC ONLY)
Bacteria, UA: NONE SEEN
Bilirubin Urine: NEGATIVE
Glucose, UA: NEGATIVE mg/dL
HGB URINE DIPSTICK: NEGATIVE
KETONES UR: NEGATIVE mg/dL
LEUKOCYTES UA: NEGATIVE
NITRITE: NEGATIVE
PH: 7 (ref 5.0–8.0)
PROTEIN: NEGATIVE mg/dL
Specific Gravity, Urine: 1.014 (ref 1.005–1.030)

## 2015-02-03 LAB — CBC
HCT: 37.2 % (ref 35.0–47.0)
HEMOGLOBIN: 12.7 g/dL (ref 12.0–16.0)
MCH: 34.8 pg — AB (ref 26.0–34.0)
MCHC: 34.3 g/dL (ref 32.0–36.0)
MCV: 101.5 fL — AB (ref 80.0–100.0)
Platelets: 253 10*3/uL (ref 150–440)
RBC: 3.67 MIL/uL — AB (ref 3.80–5.20)
RDW: 14.6 % — ABNORMAL HIGH (ref 11.5–14.5)
WBC: 7 10*3/uL (ref 3.6–11.0)

## 2015-02-03 LAB — BASIC METABOLIC PANEL
ANION GAP: 7 (ref 5–15)
BUN: 10 mg/dL (ref 6–20)
CALCIUM: 8.6 mg/dL — AB (ref 8.9–10.3)
CHLORIDE: 106 mmol/L (ref 101–111)
CO2: 24 mmol/L (ref 22–32)
Creatinine, Ser: 0.57 mg/dL (ref 0.44–1.00)
GFR calc non Af Amer: >60 mL/min (ref >60)
Glucose, Bld: 129 mg/dL — ABNORMAL HIGH (ref 65–99)
Potassium: 3.5 mmol/L (ref 3.5–5.1)
Sodium: 137 mmol/L (ref 135–145)

## 2015-02-03 LAB — ETHANOL: Alcohol, Ethyl (B): <5 mg/dL (ref <5)

## 2015-02-03 LAB — POCT PREGNANCY, URINE: Preg Test, Ur: NEGATIVE

## 2015-02-03 LAB — GLUCOSE, CAPILLARY: GLUCOSE-CAPILLARY: 133 mg/dL — AB (ref 65–99)

## 2015-02-03 NOTE — ED Notes (Signed)
Pt comes into the ED via EMS from home c/o seizure that occurred an hour ago.  Patient's seizure was witnessed by friends who explained that it lasted less than a minute and included rigidity and shaking.  Patient fell to the ground but denies hitting her head or pain. Patient has h/o ETOH abuse with last occurrence being on 01/30/15.  No personal history of seizures but has a family history of them.  Patient states she has an arachnoid cyst. 132/72, 157 CBG, 98 HR.

## 2015-02-03 NOTE — ED Provider Notes (Signed)
[facility] Emergency Department Provider Note     Time seen: ----------------------------------------- 2:39 PM on 02/03/2015 -----------------------------------------    I have reviewed the triage vital signs and the nursing notes.   HISTORY  Chief Complaint Seizures    HPI Suzanne [name] is a 30 y.o. female brought to the ER by EMS from home after having a seizure that occurred about an hour ago. Patient seizure was witnessed by friends explained it lasted less than a minute included rigidity and shaking. She fell to the ground but denies hitting her head or any pain, she was noted to have an abrasion to the dorsal aspect of the right hand. She doesn't history of alcohol abuse last occurrence being New Year's Day. She does not have a personal history of seizures, does not take seizure medication.   Past Medical History  Diagnosis Date  . UTI (urinary tract infection)   . Vaginal trichomoniasis   . History of chicken pox   . Bacterial infection   . HPV (human papilloma virus) anogenital infection   . Dysplasia of cervix, low grade (CIN 1)   . LGSIL (low grade squamous intraepithelial dysplasia)   . Brain cyst   . Depression     Patient Active Problem List   Diagnosis Date Noted  . Depression, major, recurrent, moderate (HCC) 07/08/2014  . Alcohol use disorder, severe, dependence (HCC) 07/07/2014    Past Surgical History  Procedure Laterality Date  . Skin graft      Allergies Review of patient's allergies indicates no known allergies.  Social History Social History  Substance Use Topics  . Smoking status: Current Every Day Smoker -- 0.50 packs/day    Types: Cigarettes  . Smokeless tobacco: None  . Alcohol Use: Yes     Comment: binge drinks    Review of Systems Constitutional: Negative for fever. Eyes: Negative for visual changes. ENT: Negative for sore throat. Cardiovascular: Negative for chest pain. Respiratory: Negative  for shortness of breath. Gastrointestinal: Negative for abdominal pain, vomiting and diarrhea. Genitourinary: Negative for dysuria. Musculoskeletal: Negative for back pain. Skin: Negative for rash. Neurological: Negative for headaches, focal weakness or numbness.  10-point ROS otherwise negative.  ____________________________________________   PHYSICAL EXAM:  VITAL SIGNS: ED Triage Vitals  Enc Vitals Group     BP 02/03/15 1419 118/76 mmHg     Pulse Rate 02/03/15 1419 81     Resp 02/03/15 1419 16     Temp 02/03/15 1419 98.2 F (36.8 C)     Temp Source 02/03/15 1419 Oral     SpO2 02/03/15 1419 99 %     Weight 02/03/15 1419 115 lb (52.164 kg)     Height 02/03/15 1419 5\' 1"  (1.549 m)     Head Cir --      Peak Flow --      Pain Score --      Pain Loc --      Pain Edu? --      Excl. in GC? --     Constitutional: Alert and oriented. Well appearing and in no distress. Eyes: Conjunctivae are normal. PERRL. Normal extraocular movements. ENT   Head: Normocephalic and atraumatic.   Nose: No congestion/rhinnorhea.   Mouth/Throat: Mucous membranes are moist.   Neck: No stridor. Cardiovascular: Normal rate, regular rhythm. Normal and symmetric distal pulses are present in all extremities. No murmurs, rubs, or gallops. Respiratory: Normal respiratory effort without tachypnea nor retractions. Breath sounds are clear and equal bilaterally. No wheezes/rales/rhonchi.  Gastrointestinal: Soft and nontender. No distention. No abdominal bruits.  Musculoskeletal: Nontender with normal range of motion in all extremities. No joint effusions.  No lower extremity tenderness nor edema. Neurologic:  Normal speech and language. No gross focal neurologic deficits are appreciated. Speech is normal. No gait instability. Skin:  Small abrasions are noted to the dorsum of the right hand Psychiatric: Mood and affect are normal. Speech and behavior are normal. Patient exhibits appropriate insight  and judgment. ____________________________________________  EKG: Interpreted by me. Sinus rhythm with a rate of 87 bpm, normal PR interval, normal QS with, normal QT interval.  ____________________________________________  ED COURSE:  Pertinent labs & imaging results that were available during my care of the patient were reviewed by me and considered in my medical decision making (see chart for details). Patient with possible seizure event, we'll obtain basic labs CT imaging ____________________________________________    LABS (pertinent positives/negatives)  Labs Reviewed  BASIC METABOLIC PANEL - Abnormal; Notable for the following:    Glucose, Bld 129 (*)    Calcium 8.6 (*)    All other components within normal limits  CBC - Abnormal; Notable for the following:    RBC 3.67 (*)    MCV 101.5 (*)    MCH 34.8 (*)    RDW 14.6 (*)    All other components within normal limits  GLUCOSE, CAPILLARY - Abnormal; Notable for the following:    Glucose-Capillary 133 (*)    All other components within normal limits  URINALYSIS COMPLETEWITH MICROSCOPIC ([facility] ONLY)  ETHANOL  URINE DRUG SCREEN, QUALITATIVE ([facility] ONLY)  CBG MONITORING, ED  POCT PREGNANCY, URINE  POC URINE PREG, ED    RADIOLOGY Images were viewed by me  CT head  ____________________________________________  FINAL ASSESSMENT AND PLAN  Possible seizure  Plan: Patient with labs and imaging as dictated above. CT is pending at the end of my shift. She is currently awake and alert, this is likely alcohol-related.   Emily FilbertWilliams, Jonathan E, MD   Emily FilbertJonathan E Williams, MD 02/03/15 [phone]

## 2015-02-03 NOTE — ED Provider Notes (Signed)
Assume care of patient at 3 PM from Dr. Mayford KnifeWilliams. CT result now available which is unremarkable. Vital signs are stable and completely normal. No evidence of any autonomic dysfunction or significant alcohol withdrawal at this time. Additionally, the patient has been abstaining from alcohol for about one week and is not a significant risk of further worsening of any alcohol withdrawal. We'll discharge home to follow up with primary care. We'll encourage her to follow up with neurology as well to consider restarting management of her seizure disorder that she's had in the past. I think today's seizure is likely due to her underlying seizure disorder that is exacerbated by abstaining from alcohol though it is not actually withdrawal.  Sharman CheekPhillip Ulys Favia, MD 02/03/15 1601

## 2015-02-03 NOTE — Discharge Instructions (Signed)

## 2015-06-14 ENCOUNTER — Emergency Department
Admission: EM | Admit: 2015-06-14 | Discharge: 2015-06-14 | Disposition: A | Discharge status: Home / Self Care | Payer: Medicaid Other | Attending: Emergency Medicine | Admitting: Emergency Medicine

## 2015-06-14 ENCOUNTER — Encounter: Payer: Self-pay | Admitting: Emergency Medicine

## 2015-06-14 DIAGNOSIS — F1290 Cannabis use, unspecified, uncomplicated: Secondary | ICD-10-CM | POA: Insufficient documentation

## 2015-06-14 DIAGNOSIS — A084 Viral intestinal infection, unspecified: Secondary | ICD-10-CM | POA: Diagnosis not present

## 2015-06-14 DIAGNOSIS — Z945 Skin transplant status: Secondary | ICD-10-CM | POA: Insufficient documentation

## 2015-06-14 DIAGNOSIS — F17210 Nicotine dependence, cigarettes, uncomplicated: Secondary | ICD-10-CM | POA: Diagnosis not present

## 2015-06-14 DIAGNOSIS — F331 Major depressive disorder, recurrent, moderate: Secondary | ICD-10-CM | POA: Insufficient documentation

## 2015-06-14 DIAGNOSIS — R112 Nausea with vomiting, unspecified: Secondary | ICD-10-CM | POA: Diagnosis present

## 2015-06-14 LAB — URINALYSIS COMPLETE WITH MICROSCOPIC (ARMC ONLY)
BACTERIA UA: NONE SEEN
BILIRUBIN URINE: NEGATIVE
Glucose, UA: NEGATIVE mg/dL
HGB URINE DIPSTICK: NEGATIVE
LEUKOCYTES UA: NEGATIVE
NITRITE: NEGATIVE
PH: 5 (ref 5.0–8.0)
Protein, ur: 30 mg/dL — AB
SPECIFIC GRAVITY, URINE: 1.031 — AB (ref 1.005–1.030)

## 2015-06-14 LAB — CBC
HEMATOCRIT: 36.8 % (ref 35.0–47.0)
Hemoglobin: 12.5 g/dL (ref 12.0–16.0)
MCH: 34.3 pg — ABNORMAL HIGH (ref 26.0–34.0)
MCHC: 33.9 g/dL (ref 32.0–36.0)
MCV: 101 fL — AB (ref 80.0–100.0)
Platelets: 306 10*3/uL (ref 150–440)
RBC: 3.64 MIL/uL — ABNORMAL LOW (ref 3.80–5.20)
RDW: 13.6 % (ref 11.5–14.5)
WBC: 11.5 10*3/uL — ABNORMAL HIGH (ref 3.6–11.0)

## 2015-06-14 LAB — COMPREHENSIVE METABOLIC PANEL
ALT: 13 U/L — AB (ref 14–54)
AST: 17 U/L (ref 15–41)
Albumin: 4.2 g/dL (ref 3.5–5.0)
Alkaline Phosphatase: 56 U/L (ref 38–126)
Anion gap: 7 (ref 5–15)
BUN: 20 mg/dL (ref 6–20)
CHLORIDE: 107 mmol/L (ref 101–111)
CO2: 23 mmol/L (ref 22–32)
CREATININE: 0.66 mg/dL (ref 0.44–1.00)
Calcium: 8.8 mg/dL — ABNORMAL LOW (ref 8.9–10.3)
GFR calc Af Amer: >60 mL/min (ref >60)
GFR calc non Af Amer: >60 mL/min (ref >60)
Glucose, Bld: 85 mg/dL (ref 65–99)
Potassium: 3.6 mmol/L (ref 3.5–5.1)
SODIUM: 137 mmol/L (ref 135–145)
Total Bilirubin: 0.5 mg/dL (ref 0.3–1.2)
Total Protein: 6.8 g/dL (ref 6.5–8.1)

## 2015-06-14 LAB — POCT PREGNANCY, URINE: PREG TEST UR: NEGATIVE

## 2015-06-14 LAB — LIPASE, BLOOD: LIPASE: 19 U/L (ref 11–51)

## 2015-06-14 MED ORDER — ONDANSETRON HCL 4 MG PO TABS: ORAL_TABLET | ORAL | Status: DC

## 2015-06-14 MED ORDER — ONDANSETRON 4 MG PO TBDP
ORAL_TABLET | ORAL | Status: AC
  Filled 2015-06-14: qty 1

## 2015-06-14 MED ORDER — ONDANSETRON 4 MG PO TBDP
4.0000 mg | ORAL_TABLET | ORAL | Status: AC
  Administered 2015-06-14: 4 mg via ORAL

## 2015-06-14 NOTE — ED Provider Notes (Signed)
[facility] Emergency Department Provider Note  ____________________________________________  Time seen: Approximately 6:12 PM  I have reviewed the triage vital signs and the nursing notes.   HISTORY  Chief Complaint Abdominal Pain and Emesis    HPI Suzanne [name] is a 30 y.o. female who presents with acute onset during the night at about 3:00 in the morning of nausea and vomiting.  She had several episodes of vomiting during the night and then progressed to also having diarrhea during the day today.She reports 4-5 episodes of vomiting over the course of the day but she has not thrown up for about 2-1/2-3 hours.  She had a similar number of loose, nonbloody stools.  She has mild abdominal cramping associated with diarrhea.  She describes the GI symptoms as severe and nothing is making them worse although they are getting better over time.  She had pizza for dinner last night and the other people with whom she shared it did not get sick.  She denies fever/chills, chest pain, shortness of breath, dysuria.  She has had a throbbing headache that is mild to moderate in intensity.   Past Medical History  Diagnosis Date  . UTI (urinary tract infection)   . Vaginal trichomoniasis   . History of chicken pox   . Bacterial infection   . HPV (human papilloma virus) anogenital infection   . Dysplasia of cervix, low grade (CIN 1)   . LGSIL (low grade squamous intraepithelial dysplasia)   . Brain cyst   . Depression     Patient Active Problem List   Diagnosis Date Noted  . Depression, major, recurrent, moderate (HCC) 07/08/2014  . Alcohol use disorder, severe, dependence (HCC) 07/07/2014    Past Surgical History  Procedure Laterality Date  . Skin graft      Current Outpatient Rx  Name  Route  Sig  Dispense  Refill  . ibuprofen (ADVIL,MOTRIN) 200 MG tablet   Oral   Take 400 mg by mouth every 6 (six) hours as needed.         . ondansetron (ZOFRAN) 4  MG tablet      Take 1-2 tabs by mouth every 8 hours as needed for nausea/vomiting   30 tablet   0     Allergies Review of patient's allergies indicates no known allergies.  Family History  Problem Relation Age of Onset  . Diabetes Mother   . Cancer Mother     Social History Social History  Substance Use Topics  . Smoking status: Current Every Day Smoker -- 0.50 packs/day    Types: Cigarettes  . Smokeless tobacco: None  . Alcohol Use: Yes     Comment: binge drinks    Review of Systems Constitutional: No fever/chills Eyes: No visual changes. ENT: No sore throat. Cardiovascular: Denies chest pain. Respiratory: Denies shortness of breath. Gastrointestinal: +N/V/D w/ mild abd cramping (with the diarrhea) Genitourinary: Negative for dysuria.  Neg for vaginal pain/discharge Musculoskeletal: Negative for back pain. Skin: Negative for rash. Neurological: Negative for headaches, focal weakness or numbness.  10-point ROS otherwise negative.  ____________________________________________   PHYSICAL EXAM:  VITAL SIGNS: ED Triage Vitals  Enc Vitals Group     BP 06/14/15 1647 110/63 mmHg     Pulse Rate 06/14/15 1647 100     Resp 06/14/15 1647 18     Temp 06/14/15 1647 98.1 F (36.7 C)     Temp Source 06/14/15 1647 Oral     SpO2 06/14/15 1647 99 %  Weight 06/14/15 1647 120 lb (54.432 kg)     Height 06/14/15 1647 5\' 1"  (1.549 m)     Head Cir --      Peak Flow --      Pain Score 06/14/15 1647 10     Pain Loc --      Pain Edu? --      Excl. in GC? --     Constitutional: Alert and oriented. Well appearing and in no acute distress. Eyes: Conjunctivae are normal. PERRL. EOMI. Head: Atraumatic. Nose: No congestion/rhinnorhea. Mouth/Throat: Mucous membranes are moist.  Oropharynx non-erythematous. Neck: No stridor.  No meningeal signs.   Cardiovascular: Borderline tachycardia, regular rhythm. Good peripheral circulation. Grossly normal heart sounds.   Respiratory:  Normal respiratory effort.  No retractions. Lungs CTAB. Gastrointestinal: Soft and nontender. No distention.  No rebound nor guarding. Musculoskeletal: No lower extremity tenderness nor edema. No gross deformities of extremities. Neurologic:  Normal speech and language. No gross focal neurologic deficits are appreciated.  Skin:  Skin is warm, dry and intact. No rash noted. Psychiatric: Mood and affect are normal. Speech and behavior are normal.  ____________________________________________   LABS (all labs ordered are listed, but only abnormal results are displayed)  Labs Reviewed  COMPREHENSIVE METABOLIC PANEL - Abnormal; Notable for the following:    Calcium 8.8 (*)    ALT 13 (*)    All other components within normal limits  CBC - Abnormal; Notable for the following:    WBC 11.5 (*)    RBC 3.64 (*)    MCV 101.0 (*)    MCH 34.3 (*)    All other components within normal limits  URINALYSIS COMPLETEWITH MICROSCOPIC ([facility] ONLY) - Abnormal; Notable for the following:    Color, Urine YELLOW (*)    APPearance HAZY (*)    Ketones, ur TRACE (*)    Specific Gravity, Urine 1.031 (*)    Protein, ur 30 (*)    Squamous Epithelial / LPF 6-30 (*)    All other components within normal limits  LIPASE, BLOOD  POC URINE PREG, ED  POCT PREGNANCY, URINE   ____________________________________________  EKG  None ____________________________________________  RADIOLOGY   No results found.  ____________________________________________   PROCEDURES  Procedure(s) performed: None  Critical Care performed: No ____________________________________________   INITIAL IMPRESSION / ASSESSMENT AND PLAN / ED COURSE  Pertinent labs & imaging results that were available during my care of the patient were reviewed by me and considered in my medical decision making (see chart for details).  Reassuring workup and exam.  The patient is able to tolerate by mouth intake and currently has no pain,  no abdominal tenderness, and her nausea has resolved.  I had my usual and customary discussion regarding probable viral gastroenteritis including return precautions.  She understands and agrees with the plan.   ____________________________________________  FINAL CLINICAL IMPRESSION(S) / ED DIAGNOSES  Final diagnoses:  Viral gastroenteritis     MEDICATIONS GIVEN DURING THIS VISIT:  Medications  ondansetron (ZOFRAN-ODT) disintegrating tablet 4 mg (4 mg Oral Given 06/14/15 1827)     NEW OUTPATIENT MEDICATIONS STARTED DURING THIS VISIT:  Discharge Medication List as of 06/14/2015  6:23 PM    START taking these medications   Details  ondansetron (ZOFRAN) 4 MG tablet Take 1-2 tabs by mouth every 8 hours as needed for nausea/vomiting, Print          Note:  This document was prepared using Dragon voice recognition software and may include unintentional dictation errors.  Loleta Rose, MD 06/14/15 [phone]

## 2015-06-14 NOTE — Discharge Instructions (Signed)
We believe your symptoms are caused by either a viral infection or possible a bad food exposure.  Either way, since your symptoms have improved, we feel it is safe for you to go home and follow up with your regular doctor.  Please read the included information and stick to a bland diet for the next two days.  Drink plenty of clear fluids, and if you were provided with a prescription, please take it according to the label instructions.   ° °If you develop any new or worsening symptoms, including persistent vomiting not controlled with medication, fever greater than 101, severe or worsening abdominal pain, or other symptoms that concern you, please return immediately to the Emergency Department. ° °Viral Gastroenteritis  °Viral gastroenteritis is also known as stomach flu. This condition affects the stomach and intestinal tract. It can cause sudden diarrhea and vomiting. The illness typically lasts 3 to 8 days. Most people develop an immune response that eventually gets rid of the virus. While this natural response develops, the virus can make you quite ill.  °CAUSES  °Many different viruses can cause gastroenteritis, such as rotavirus or noroviruses. You can catch one of these viruses by consuming contaminated food or water. You may also catch a virus by sharing utensils or other personal items with an infected person or by touching a contaminated surface.  °SYMPTOMS  °The most common symptoms are diarrhea and vomiting. These problems can cause a severe loss of body fluids (dehydration) and a body salt (electrolyte) imbalance. Other symptoms may include:  °Fever.  °Headache.  °Fatigue.  °Abdominal pain. °DIAGNOSIS  °Your caregiver can usually diagnose viral gastroenteritis based on your symptoms and a physical exam. A stool sample may also be taken to test for the presence of viruses or other infections.  °TREATMENT  °This illness typically goes away on its own. Treatments are aimed at rehydration. The most serious  cases of viral gastroenteritis involve vomiting so severely that you are not able to keep fluids down. In these cases, fluids must be given through an intravenous line (IV).  °HOME CARE INSTRUCTIONS  °Drink enough fluids to keep your urine clear or pale yellow. Drink small amounts of fluids frequently and increase the amounts as tolerated.  °Ask your caregiver for specific rehydration instructions.  °Avoid:  °Foods high in sugar.  °Alcohol.  °Carbonated drinks.  °Tobacco.  °Juice.  °Caffeine drinks.  °Extremely hot or cold fluids.  °Fatty, greasy foods.  °Too much intake of anything at one time.  °Dairy products until 24 to 48 hours after diarrhea stops. °You may consume probiotics. Probiotics are active cultures of beneficial bacteria. They may lessen the amount and number of diarrheal stools in adults. Probiotics can be found in yogurt with active cultures and in supplements.  °Wash your hands well to avoid spreading the virus.  °Only take over-the-counter or prescription medicines for pain, discomfort, or fever as directed by your caregiver. Do not give aspirin to children. Antidiarrheal medicines are not recommended.  °Ask your caregiver if you should continue to take your regular prescribed and over-the-counter medicines.  °Keep all follow-up appointments as directed by your caregiver. °SEEK IMMEDIATE MEDICAL CARE IF:  °You are unable to keep fluids down.  °You do not urinate at least once every 6 to 8 hours.  °You develop shortness of breath.  °You notice blood in your stool or vomit. This may look like coffee grounds.  °You have abdominal pain that increases or is concentrated in one small area (  localized).  °You have persistent vomiting or diarrhea.  °You have a fever.  °The patient is a child younger than 3 months, and he or she has a fever.  °The patient is a child older than 3 months, and he or she has a fever and persistent symptoms.  °The patient is a child older than 3 months, and he or she has a fever  and symptoms suddenly get worse.  °The patient is a baby, and he or she has no tears when crying. °MAKE SURE YOU:  °Understand these instructions.  °Will watch your condition.  °Will get help right away if you are not doing well or get worse. °This information is not intended to replace advice given to you by your health care provider. Make sure you discuss any questions you have with your health care provider.  °Document Released: 01/15/2005 Document Revised: 04/09/2011 Document Reviewed: 11/01/2010  °Elsevier Interactive Patient Education ©2016 [employer]  ° °

## 2015-06-14 NOTE — ED Notes (Signed)
Pt to ed with c/o abd pain and vomiting since this am.  Pt states vomitus x 1 today. Also reports diarrhea.

## 2015-06-14 NOTE — ED Notes (Signed)
PO challenge completed and pt tolerated without issue.

## 2015-06-16 ENCOUNTER — Emergency Department
Admission: EM | Admit: 2015-06-16 | Discharge: 2015-06-16 | Disposition: A | Discharge status: Home / Self Care | Payer: Medicaid Other | Attending: Emergency Medicine | Admitting: Emergency Medicine

## 2015-06-16 ENCOUNTER — Emergency Department: Payer: Medicaid Other

## 2015-06-16 ENCOUNTER — Encounter: Payer: Self-pay | Admitting: *Deleted

## 2015-06-16 DIAGNOSIS — F17210 Nicotine dependence, cigarettes, uncomplicated: Secondary | ICD-10-CM | POA: Insufficient documentation

## 2015-06-16 DIAGNOSIS — F329 Major depressive disorder, single episode, unspecified: Secondary | ICD-10-CM | POA: Insufficient documentation

## 2015-06-16 DIAGNOSIS — L03032 Cellulitis of left toe: Secondary | ICD-10-CM | POA: Insufficient documentation

## 2015-06-16 DIAGNOSIS — M79675 Pain in left toe(s): Secondary | ICD-10-CM | POA: Diagnosis present

## 2015-06-16 DIAGNOSIS — F1290 Cannabis use, unspecified, uncomplicated: Secondary | ICD-10-CM | POA: Insufficient documentation

## 2015-06-16 DIAGNOSIS — Z79899 Other long term (current) drug therapy: Secondary | ICD-10-CM | POA: Diagnosis not present

## 2015-06-16 LAB — CBC
HCT: 34.1 % — ABNORMAL LOW (ref 35.0–47.0)
HEMOGLOBIN: 11.8 g/dL — AB (ref 12.0–16.0)
MCH: 34.6 pg — AB (ref 26.0–34.0)
MCHC: 34.4 g/dL (ref 32.0–36.0)
MCV: 100.5 fL — AB (ref 80.0–100.0)
Platelets: 294 10*3/uL (ref 150–440)
RBC: 3.4 MIL/uL — AB (ref 3.80–5.20)
RDW: 13.4 % (ref 11.5–14.5)
WBC: 8.5 10*3/uL (ref 3.6–11.0)

## 2015-06-16 LAB — BASIC METABOLIC PANEL
ANION GAP: 6 (ref 5–15)
BUN: 13 mg/dL (ref 6–20)
CALCIUM: 8.9 mg/dL (ref 8.9–10.3)
CHLORIDE: 106 mmol/L (ref 101–111)
CO2: 25 mmol/L (ref 22–32)
CREATININE: 0.69 mg/dL (ref 0.44–1.00)
GFR calc non Af Amer: >60 mL/min (ref >60)
GLUCOSE: 97 mg/dL (ref 65–99)
Potassium: 3.6 mmol/L (ref 3.5–5.1)
SODIUM: 137 mmol/L (ref 135–145)

## 2015-06-16 MED ORDER — HYDROMORPHONE HCL 1 MG/ML IJ SOLN
1.0000 mg | Freq: Once | INTRAMUSCULAR | Status: AC
  Administered 2015-06-16: 1 mg via INTRAVENOUS
  Filled 2015-06-16: qty 1

## 2015-06-16 MED ORDER — SULFAMETHOXAZOLE-TRIMETHOPRIM 800-160 MG PO TABS
1.0000 | ORAL_TABLET | Freq: Once | ORAL | Status: AC
  Administered 2015-06-16: 1 via ORAL
  Filled 2015-06-16: qty 1

## 2015-06-16 MED ORDER — MORPHINE SULFATE (PF) 4 MG/ML IV SOLN
4.0000 mg | Freq: Once | INTRAVENOUS | Status: AC
  Administered 2015-06-16: 4 mg via INTRAVENOUS
  Filled 2015-06-16: qty 1

## 2015-06-16 MED ORDER — SULFAMETHOXAZOLE-TRIMETHOPRIM 800-160 MG PO TABS: 1.0000 | ORAL_TABLET | Freq: Two times a day (BID) | ORAL | Status: DC

## 2015-06-16 MED ORDER — HYDROCODONE-ACETAMINOPHEN 5-325 MG PO TABS: 1.0000 | ORAL_TABLET | Freq: Four times a day (QID) | ORAL | Status: DC | PRN

## 2015-06-16 NOTE — ED Notes (Signed)
Called lab to ask status of specimens

## 2015-06-16 NOTE — ED Notes (Signed)
See triage note  States she stubbed toe about 1 week ago  Injury to left 4 th toe.  States she pulled the nail off couple of days ago  No toe is red and swollen with blister lateraly

## 2015-06-16 NOTE — Discharge Instructions (Signed)
Paronychia °Paronychia is an infection of the skin that surrounds a nail. It usually affects the skin around a fingernail, but it may also occur near a toenail. It often causes pain and swelling around the nail. This condition may come on suddenly or develop over a longer period. In some cases, a collection of pus (abscess) can form near or under the nail. Usually, paronychia is not serious and it clears up with treatment. °CAUSES °This condition may be caused by bacteria or fungi. It is commonly caused by either Streptococcus or Staphylococcus bacteria. The bacteria or fungi often cause the infection by getting into the affected area through an opening in the skin, such as a cut or a hangnail. °RISK FACTORS °This condition is more likely to develop in: °· People who get their hands wet often, such as those who work as dishwashers, bartenders, or nurses. °· People who bite their fingernails or suck their thumbs. °· People who trim their nails too short. °· People who have hangnails or injured fingertips. °· People who get manicures. °· People who have diabetes. °SYMPTOMS °Symptoms of this condition include: °· Redness and swelling of the skin near the nail. °· Tenderness around the nail when you touch the area. °· Pus-filled bumps under the cuticle. The cuticle is the skin at the base or sides of the nail. °· Fluid or pus under the nail. °· Throbbing pain in the area. °DIAGNOSIS °This condition is usually diagnosed with a physical exam. In some cases, a sample of pus may be taken from an abscess to be tested in a lab. This can help to determine what type of bacteria or fungi is causing the condition. °TREATMENT °Treatment for this condition depends on the cause and severity of the condition. If the condition is mild, it may clear up on its own in a few days. Your health care provider may recommend soaking the affected area in warm water a few times a day. When treatment is needed, the options may  include: °· Antibiotic medicine, if the condition is caused by a bacterial infection. °· Antifungal medicine, if the condition is caused by a fungal infection. °· Incision and drainage, if an abscess is present. In this procedure, the health care provider will cut open the abscess so the pus can drain out. °HOME CARE INSTRUCTIONS °· Soak the affected area in warm water if directed to do so by your health care provider. You may be told to do this for 20 minutes, 2-3 times a day. Keep the area dry in between soakings. °· Take medicines only as directed by your health care provider. °· If you were prescribed an antibiotic medicine, finish all of it even if you start to feel better. °· Keep the affected area clean. °· Do not try to drain a fluid-filled bump yourself. °· If you will be washing dishes or performing other tasks that require your hands to get wet, wear rubber gloves. You should also wear gloves if your hands might come in contact with irritating substances, such as cleaners or chemicals. °· Follow your health care provider's instructions about: °¨ Wound care. °¨ Bandage (dressing) changes and removal. °SEEK MEDICAL CARE IF: °· Your symptoms get worse or do not improve with treatment. °· You have a fever or chills. °· You have redness spreading from the affected area. °· You have continued or increased fluid, blood, or pus coming from the affected area. °· Your finger or knuckle becomes swollen or is difficult to move. °  °  This information is not intended to replace advice given to you by your health care provider. Make sure you discuss any questions you have with your health care provider.   Document Released: 07/11/2000 Document Revised: 06/01/2014 Document Reviewed: 12/23/2013 Elsevier Interactive Patient Education 2016 [employer]   Please soak left foot and half peroxide and half Water 3 times daily for 5 minutes a day. Please apply triple antibiotic ointment to the left fourth toe after  soaking. Please take antibiotics as prescribed. If any fevers increased pain swelling or redness, return to the emergency department. If no improvement in 2-3 days, return to the ER.

## 2015-06-16 NOTE — ED Notes (Signed)
  Reviewed d/c instructions, follow-up care, and prescriptions with pt. Pt verbalized understanding 

## 2015-06-16 NOTE — ED Provider Notes (Signed)
CSN: [id number]     Arrival date & time 06/16/15  1701 History   First MD Initiated Contact with Patient 06/16/15 1756     Chief Complaint  Patient presents with  . Toe Injury     (Consider location/radiation/quality/duration/timing/severity/associated sxs/prior Treatment) HPI  30 year old female presents to the emergency department for left fourth toe pain swelling and redness. Patient states a couple weeks ago she stubbed her left fourth toe, injuring the nail. She states she removed the nail although the nail appears intact. Pedal days ago patient noticed increased pain and swelling and redness. She comes in to the emergency department for evaluation of this pain. She denies any fevers. Pain is moderate. She has had Tylenol with no relief. She is not using antibiotic's. She denies any drainage. Along the left fourth toe denies any ankle pain is ambulatory with assistive devices.   Past Medical History  Diagnosis Date  . UTI (urinary tract infection)   . Vaginal trichomoniasis   . History of chicken pox   . Bacterial infection   . HPV (human papilloma virus) anogenital infection   . Dysplasia of cervix, low grade (CIN 1)   . LGSIL (low grade squamous intraepithelial dysplasia)   . Brain cyst   . Depression    Past Surgical History  Procedure Laterality Date  . Skin graft     Family History  Problem Relation Age of Onset  . Diabetes Mother   . Cancer Mother    Social History  Substance Use Topics  . Smoking status: Current Every Day Smoker -- 0.50 packs/day    Types: Cigarettes  . Smokeless tobacco: None  . Alcohol Use: Yes     Comment: binge drinks   OB History    Gravida Para Term Preterm AB TAB SAB Ectopic Multiple Living   2 1             Review of Systems  Constitutional: Negative for fever, chills, activity change and fatigue.  HENT: Negative for congestion, sinus pressure and sore throat.   Eyes: Negative for visual disturbance.  Respiratory: Negative for  cough, chest tightness and shortness of breath.   Cardiovascular: Negative for chest pain and leg swelling.  Gastrointestinal: Negative for nausea, vomiting, abdominal pain and diarrhea.  Genitourinary: Negative for dysuria.  Musculoskeletal: Positive for arthralgias. Negative for gait problem.  Skin: Positive for wound. Negative for rash.  Neurological: Negative for weakness, numbness and headaches.  Hematological: Negative for adenopathy.  Psychiatric/Behavioral: Negative for behavioral problems, confusion and agitation.      Allergies  Review of patient's allergies indicates no known allergies.  Home Medications   Prior to Admission medications   Medication Sig Start Date End Date Taking? Authorizing Provider  HYDROcodone-acetaminophen (NORCO) 5-325 MG tablet Take 1 tablet by mouth every 6 (six) hours as needed for moderate pain. 06/16/15   Evon Slackhomas C Gaines, PA-C  ibuprofen (ADVIL,MOTRIN) 200 MG tablet Take 400 mg by mouth every 6 (six) hours as needed.    Historical Provider, MD  ondansetron (ZOFRAN) 4 MG tablet Take 1-2 tabs by mouth every 8 hours as needed for nausea/vomiting 06/14/15   Loleta Roseory Forbach, MD  sulfamethoxazole-trimethoprim (BACTRIM DS,SEPTRA DS) 800-160 MG tablet Take 1 tablet by mouth 2 (two) times daily. 10 days 06/16/15   Evon Slackhomas C Gaines, PA-C   BP 118/79 mmHg  Pulse 95  Temp(Src) 98.2 F (36.8 C) (Oral)  Resp 18  Ht 5\' 1"  (1.549 m)  Wt 54.432 kg  BMI 22.69 kg/m2  SpO2 99%  LMP 05/25/2015 Physical Exam  Constitutional: She is oriented to person, place, and time. She appears well-developed and well-nourished. No distress.  HENT:  Head: Normocephalic and atraumatic.  Mouth/Throat: Oropharynx is clear and moist.  Eyes: EOM are normal. Pupils are equal, round, and reactive to light. Right eye exhibits no discharge. Left eye exhibits no discharge.  Neck: Normal range of motion. Neck supple.  Cardiovascular: Normal rate, regular rhythm and intact distal pulses.    Pulmonary/Chest: Effort normal. No respiratory distress.  Musculoskeletal:  Examination of the left foot shows the patient has swelling and redness to the dorsal aspect of the left fourth toe. There is a fluctuant area along the base of the nail with purulent material present. Nail is intact. No sign of infection under the nail. Redness extends to the mid toe. She is nontender along the base of the toe along the pad.  Neurological: She is alert and oriented to person, place, and time. She has normal reflexes.  Skin: Skin is warm and dry.  Psychiatric: She has a normal mood and affect. Her behavior is normal. Thought content normal.    ED Course  Procedures (including critical care time) INCISION AND DRAINAGE Performed by: Patience Musca Consent: Verbal consent obtained. Risks and benefits: risks, benefits and alternatives were discussed Type: abscess  Body area: Left fourth toe  Anesthesia: local infiltration  Incision was made with a scalpel. Along the fence skinned abscess/paronychia. Patient fell no pain.  Complexity: complex Blunt dissection to break up loculations  Drainage: purulent moderate 3 cc   Drainage amount: 3cc   Patient tolerance: Patient tolerated the procedure well with no immediate complications.    Labs Review Labs Reviewed  CBC - Abnormal; Notable for the following:    RBC 3.40 (*)    Hemoglobin 11.8 (*)    HCT 34.1 (*)    MCV 100.5 (*)    MCH 34.6 (*)    All other components within normal limits  WOUND CULTURE  BASIC METABOLIC PANEL    Imaging Review Dg Foot Complete Left  06/16/2015  CLINICAL DATA:  Left foot injury, fourth toe swelling with pus. EXAM: LEFT FOOT - COMPLETE 3+ VIEW COMPARISON:  None. FINDINGS: Osseous alignment is normal. Bone mineralization is normal. No fracture line or displaced fracture fragment identified. No erosions, demineralization or other signs of a developing osteomyelitis. Adjacent soft tissues are  unremarkable. No soft tissue gas or foreign body identified. IMPRESSION: Negative. Electronically Signed   By: Bary Richard M.D.   On: 06/16/2015 18:33   I have personally reviewed and evaluated these images and lab results as part of my medical decision-making.   EKG Interpretation None      MDM   Final diagnoses:  Paronychia of fourth toe, left  Cellulitis of toe of left foot    30 year old female with paronychia to the left fourth toe with surrounding cellulitis. There appears to be no felon toe present. Significant purulent drainage was removed from the toe patient sought relief from the pain. She is placed on Bactrim DS 1 tab by mouth twice a day for 10 days. She will soak 3 times daily and half peroxide and water. She will apply antibiotic ointment daily. Return to the emergency department for any increased pain and swelling redness fevers or for any urgent changes in her health.    Evon Slack, PA-C 06/16/15 1958  Jeanmarie Plant, MD 06/17/15 [phone]

## 2015-06-16 NOTE — ED Notes (Signed)
States last week she stubbed her toe and pulled her left 2nd toenail off, arrives with blister and redness to left foot and swelling

## 2015-06-19 LAB — WOUND CULTURE: SPECIAL REQUESTS: NORMAL

## 2015-10-10 ENCOUNTER — Emergency Department
Admission: EM | Admit: 2015-10-10 | Discharge: 2015-10-11 | Disposition: A | Discharge status: Home / Self Care | Payer: Medicaid Other | Attending: Emergency Medicine | Admitting: Emergency Medicine

## 2015-10-10 ENCOUNTER — Encounter: Payer: Self-pay | Admitting: Emergency Medicine

## 2015-10-10 DIAGNOSIS — F17210 Nicotine dependence, cigarettes, uncomplicated: Secondary | ICD-10-CM | POA: Insufficient documentation

## 2015-10-10 DIAGNOSIS — M26623 Arthralgia of bilateral temporomandibular joint: Secondary | ICD-10-CM

## 2015-10-10 DIAGNOSIS — Z791 Long term (current) use of non-steroidal anti-inflammatories (NSAID): Secondary | ICD-10-CM | POA: Insufficient documentation

## 2015-10-10 DIAGNOSIS — R6884 Jaw pain: Secondary | ICD-10-CM | POA: Insufficient documentation

## 2015-10-10 HISTORY — DX: Alcohol abuse, uncomplicated: F10.10

## 2015-10-10 NOTE — ED Triage Notes (Signed)
Pt presents to ED with bilateral jaw pain since wednesday. Hx of the same. Pt states sometimes her jaw clicks and pops and feels like it gets suck. Pt currently able to move her jaw and answering questions without difficulty.

## 2015-10-10 NOTE — ED Notes (Signed)
Pt reports bilateral jaw pain.  Pain for 5 days.  Pt states she has a  tic and my jaw goes sideways or opens really wide.  No resp distress   No diff swallowing.  Pt drinks etoh everyday.   Pt tearful.  Pt denies SI or HI.  Sister with pt.

## 2015-10-11 MED ORDER — KETOROLAC TROMETHAMINE 60 MG/2ML IM SOLN
60.0000 mg | Freq: Once | INTRAMUSCULAR | Status: AC
  Administered 2015-10-11: 60 mg via INTRAMUSCULAR

## 2015-10-11 MED ORDER — KETOROLAC TROMETHAMINE 60 MG/2ML IM SOLN
INTRAMUSCULAR | Status: AC
  Filled 2015-10-11: qty 2

## 2015-10-11 NOTE — ED Notes (Signed)
Pt states she does not want detox at this time.  md aware.

## 2015-10-11 NOTE — ED Provider Notes (Signed)
[facility] Emergency Department Provider Note   Time seen 1:00 AM I have reviewed the triage vital signs and the nursing notes.   HISTORY  Chief Complaint Jaw Pain    HPI Suzanne [name] is a 30 y.o. female with history of alcohol and Klonopin abuse presents to the emergency department with bilateral jaw pain intermittently since Wednesday of last week. Patient admits to history of the same before stating that her jaw "clicks and pops and feels like it gets stuck. Patient states her current pain score is 10 out of 10   Past Medical History:  Diagnosis Date  . Bacterial infection   . Brain cyst   . Depression   . Dysplasia of cervix, low grade (CIN 1)   . ETOH abuse   . History of chicken pox   . HPV (human papilloma virus) anogenital infection   . LGSIL (low grade squamous intraepithelial dysplasia)   . UTI (urinary tract infection)   . Vaginal trichomoniasis     Patient Active Problem List   Diagnosis Date Noted  . Depression, major, recurrent, moderate (HCC) 07/08/2014  . Alcohol use disorder, severe, dependence (HCC) 07/07/2014    Past Surgical History:  Procedure Laterality Date  . SKIN GRAFT      Prior to Admission medications   Medication Sig Start Date End Date Taking? Authorizing Provider  HYDROcodone-acetaminophen (NORCO) 5-325 MG tablet Take 1 tablet by mouth every 6 (six) hours as needed for moderate pain. 06/16/15   Evon Slack, PA-C  ibuprofen (ADVIL,MOTRIN) 200 MG tablet Take 400 mg by mouth every 6 (six) hours as needed.    Historical Provider, MD  ondansetron (ZOFRAN) 4 MG tablet Take 1-2 tabs by mouth every 8 hours as needed for nausea/vomiting 06/14/15   Loleta Rose, MD  sulfamethoxazole-trimethoprim (BACTRIM DS,SEPTRA DS) 800-160 MG tablet Take 1 tablet by mouth 2 (two) times daily. 10 days 06/16/15   Evon Slack, PA-C    Allergies No known drug allergies  Family History  Problem Relation Age of Onset  .  Diabetes Mother   . Cancer Mother     Social History Social History  Substance Use Topics  . Smoking status: Current Every Day Smoker    Packs/day: 1.00    Types: Cigarettes  . Smokeless tobacco: Never Used  . Alcohol use Yes     Comment: binge drinks    Review of Systems Constitutional: No fever/chills Eyes: No visual changes. ENT: No sore throat. Positive for bilateral jaw pain Cardiovascular: Denies chest pain. Respiratory: Denies shortness of breath. Gastrointestinal: No abdominal pain.  No nausea, no vomiting.  No diarrhea.  No constipation. Genitourinary: Negative for dysuria. Musculoskeletal: Negative for back pain. Skin: Negative for rash. Neurological: Negative for headaches, focal weakness or numbness.  10-point ROS otherwise negative.  ____________________________________________   PHYSICAL EXAM:  VITAL SIGNS: ED Triage Vitals  Enc Vitals Group     BP 10/10/15 2232 129/84     Pulse Rate 10/10/15 2232 (!) 101     Resp 10/10/15 2232 18     Temp 10/10/15 2232 98.1 F (36.7 C)     Temp Source 10/10/15 2232 Oral     SpO2 10/10/15 2232 97 %     Weight 10/10/15 2233 122 lb (55.3 kg)     Height 10/10/15 2233 5\' 1"  (1.549 m)     Head Circumference --      Peak Flow --      Pain Score 10/10/15  2232 10     Pain Loc --      Pain Edu? --      Excl. in GC? --     Constitutional: Alert and oriented. Well appearing and in no acute distress. Eyes: Conjunctivae are normal. PERRL. EOMI. Head: Atraumatic. Ears:  Healthy appearing ear canals and TMs bilaterally *Nose: No congestion/rhinnorhea. Mouth/Throat: Mucous membranes are moist.  Oropharynx non-erythematous.Pain with palpation bilateral TMJ Neck: No stridor.  No meningeal signs.     ________________________________________    Procedures     INITIAL IMPRESSION / ASSESSMENT AND PLAN / ED COURSE  Pertinent labs & imaging results that were available during my care of the patient were reviewed by me  and considered in my medical decision making (see chart for details).  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   Clinical Course    ____________________________________________  FINAL CLINICAL IMPRESSION(S) / ED DIAGNOSES  Final diagnoses:  Bilateral temporomandibular joint pain     MEDICATIONS GIVEN DURING THIS VISIT:  Medications  ketorolac (TORADOL) injection 60 mg (60 mg Intramuscular Given 10/11/15 0018)     NEW OUTPATIENT MEDICATIONS STARTED DURING THIS VISIT:  Discharge Medication List as of 10/11/2015 12:30 AM      Discharge Medication List as of 10/11/2015 12:30 AM      Discharge Medication List as of 10/11/2015 12:30 AM       Note:  This document was prepared using Dragon voice recognition software and may include unintentional dictation errors.    Darci Currentandolph N Brown, MD 10/11/15 [phone]

## 2016-09-15 ENCOUNTER — Emergency Department
Admission: EM | Admit: 2016-09-15 | Discharge: 2016-09-16 | Disposition: A | Discharge status: Home / Self Care | Payer: Medicaid Other | Attending: Emergency Medicine | Admitting: Emergency Medicine

## 2016-09-15 ENCOUNTER — Encounter: Payer: Self-pay | Admitting: Emergency Medicine

## 2016-09-15 DIAGNOSIS — R45851 Suicidal ideations: Secondary | ICD-10-CM

## 2016-09-15 DIAGNOSIS — F1010 Alcohol abuse, uncomplicated: Secondary | ICD-10-CM | POA: Insufficient documentation

## 2016-09-15 DIAGNOSIS — F329 Major depressive disorder, single episode, unspecified: Secondary | ICD-10-CM | POA: Insufficient documentation

## 2016-09-15 DIAGNOSIS — F17210 Nicotine dependence, cigarettes, uncomplicated: Secondary | ICD-10-CM | POA: Insufficient documentation

## 2016-09-15 LAB — COMPREHENSIVE METABOLIC PANEL
ALBUMIN: 4.8 g/dL (ref 3.5–5.0)
ALT: 19 U/L (ref 14–54)
AST: 29 U/L (ref 15–41)
Alkaline Phosphatase: 76 U/L (ref 38–126)
Anion gap: 16 — ABNORMAL HIGH (ref 5–15)
BILIRUBIN TOTAL: 0.4 mg/dL (ref 0.3–1.2)
BUN: 14 mg/dL (ref 6–20)
CHLORIDE: 107 mmol/L (ref 101–111)
CO2: 19 mmol/L — ABNORMAL LOW (ref 22–32)
Calcium: 8.8 mg/dL — ABNORMAL LOW (ref 8.9–10.3)
Creatinine, Ser: 0.94 mg/dL (ref 0.44–1.00)
GFR calc Af Amer: >60 mL/min (ref >60)
GFR calc non Af Amer: >60 mL/min (ref >60)
GLUCOSE: 79 mg/dL (ref 65–99)
POTASSIUM: 3.7 mmol/L (ref 3.5–5.1)
Sodium: 142 mmol/L (ref 135–145)
Total Protein: 8 g/dL (ref 6.5–8.1)

## 2016-09-15 LAB — BASIC METABOLIC PANEL
ANION GAP: 12 (ref 5–15)
BUN: 16 mg/dL (ref 6–20)
CO2: 18 mmol/L — AB (ref 22–32)
Calcium: 7.7 mg/dL — ABNORMAL LOW (ref 8.9–10.3)
Chloride: 114 mmol/L — ABNORMAL HIGH (ref 101–111)
Creatinine, Ser: 0.72 mg/dL (ref 0.44–1.00)
GFR calc Af Amer: >60 mL/min (ref >60)
GLUCOSE: 57 mg/dL — AB (ref 65–99)
POTASSIUM: 3.7 mmol/L (ref 3.5–5.1)
Sodium: 144 mmol/L (ref 135–145)

## 2016-09-15 LAB — CBC
HEMATOCRIT: 39.6 % (ref 35.0–47.0)
Hemoglobin: 13.6 g/dL (ref 12.0–16.0)
MCH: 35.3 pg — ABNORMAL HIGH (ref 26.0–34.0)
MCHC: 34.4 g/dL (ref 32.0–36.0)
MCV: 102.5 fL — AB (ref 80.0–100.0)
Platelets: 391 10*3/uL (ref 150–440)
RBC: 3.86 MIL/uL (ref 3.80–5.20)
RDW: 13.9 % (ref 11.5–14.5)
WBC: 10.9 10*3/uL (ref 3.6–11.0)

## 2016-09-15 LAB — POCT PREGNANCY, URINE: Preg Test, Ur: NEGATIVE

## 2016-09-15 LAB — ACETAMINOPHEN LEVEL: Acetaminophen (Tylenol), Serum: <10 ug/mL — ABNORMAL LOW (ref 10–30)

## 2016-09-15 LAB — URINE DRUG SCREEN, QUALITATIVE (ARMC ONLY)
AMPHETAMINES, UR SCREEN: NOT DETECTED
Barbiturates, Ur Screen: NOT DETECTED
Benzodiazepine, Ur Scrn: NOT DETECTED
Cannabinoid 50 Ng, Ur ~~LOC~~: POSITIVE — AB
Cocaine Metabolite,Ur ~~LOC~~: NOT DETECTED
MDMA (ECSTASY) UR SCREEN: NOT DETECTED
Methadone Scn, Ur: NOT DETECTED
OPIATE, UR SCREEN: NOT DETECTED
PHENCYCLIDINE (PCP) UR S: NOT DETECTED
Tricyclic, Ur Screen: NOT DETECTED

## 2016-09-15 LAB — ETHANOL: Alcohol, Ethyl (B): 297 mg/dL — ABNORMAL HIGH (ref <5)

## 2016-09-15 LAB — SALICYLATE LEVEL: Salicylate Lvl: 14.5 mg/dL (ref 2.8–30.0)

## 2016-09-15 MED ORDER — SODIUM CHLORIDE 0.9 % IV SOLN
1000.0000 mL | Freq: Once | INTRAVENOUS | Status: AC
  Administered 2016-09-15: 1000 mL via INTRAVENOUS

## 2016-09-15 NOTE — ED Notes (Signed)

## 2016-09-15 NOTE — BH Assessment (Signed)
Assessment Note  Suzanne [name] is an 31 y.o. female who presents to ED under involuntary commitment. Pt reports her and her boyfriend got into an argument/altercation when pt sent a text message stating she was going to kill herself. Pt currently denies having thoughts to harm herself at this time. She states "I would never do anything to harm myself or anyone else". She denied past suicide attempts. Pt is extremely tearful during TTS assessment; however, is cooperative and able to verbally communicate. She reports having symptoms of depression, insomnia, and recent alcohol use. She reports having these depressive symptoms "the past couple of months". She reports she drank "a couple pints of liquor tonight" prior to presenting to ED. She denied previous inpatient Tx; however, after review of pt's chart, she received inpatient Tx in 06/2014 for alcohol use disorder. She further reports being clean for 30 days prior to drinking tonight. Pt denied HI/hallucinations/delusions. Pt is alert and oriented x4, with a labile affect.  Diagnosis:  Alcohol Use Disorder, unspecified Unspecified Depressive Disorder  Past Medical History:  Past Medical History:  Diagnosis Date  . Bacterial infection   . Brain cyst   . Depression   . Dysplasia of cervix, low grade (CIN 1)   . ETOH abuse   . History of chicken pox   . HPV (human papilloma virus) anogenital infection   . LGSIL (low grade squamous intraepithelial dysplasia)   . UTI (urinary tract infection)   . Vaginal trichomoniasis     Past Surgical History:  Procedure Laterality Date  . SKIN GRAFT      Family History:  Family History  Problem Relation Age of Onset  . Diabetes Mother   . Cancer Mother     Social History:  reports that she has been smoking Cigarettes.  She has been smoking about 1.00 pack per day. She has never used smokeless tobacco. She reports that she drinks alcohol. She reports that she does not use drugs.  Additional  Social History:  Alcohol / Drug Use Pain Medications: None Reported Prescriptions: None Reported Over the Counter: None Reported History of alcohol / drug use?: Yes Longest period of sobriety (when/how long): 30 days clean Negative Consequences of Use: Personal relationships Withdrawal Symptoms: Agitation Substance #1 Name of Substance 1: Alcohol 1 - Age of First Use: UKN 1 - Amount (size/oz): "a couple of pints" 1 - Frequency: "a couple times a week" 1 - Duration: UKN 1 - Last Use / Amount: 09/15/2016  CIWA: CIWA-Ar BP: 116/78 Pulse Rate: (!) 102 COWS:    Allergies: No Known Allergies  Home Medications:  (Not in a hospital admission)  OB/GYN Status:  Patient's last menstrual period was 09/13/2016.  General Assessment Data Location of Assessment: [facility] ED TTS Assessment: In system Is this a Tele or Face-to-Face Assessment?: Face-to-Face Is this an Initial Assessment or a Re-assessment for this encounter?: Initial Assessment Marital status: Long term relationship Maiden name: N/A Is patient pregnant?: Unknown Pregnancy Status: Unknown Living Arrangements: Spouse/significant other (Boyfriend) Can pt return to current living arrangement?: Yes Admission Status: Involuntary Is patient capable of signing voluntary admission?: No Referral Source: Self/Family/Friend Insurance type: Medicaid  Medical Screening Exam [facility] Walk-in ONLY) Medical Exam completed: Yes  Crisis Care Plan Living Arrangements: Spouse/significant other (Boyfriend) Legal Guardian: Other: (Self) Name of Psychiatrist: None reported Name of Therapist: None Reported  Education Status Is patient currently in school?: No Current Grade: N/A Highest grade of school patient has completed: UKN Name of school: N/A Contact  person: N/A  Risk to self with the past 6 months Suicidal Ideation: No Has patient been a risk to self within the past 6 months prior to admission? : No Suicidal Intent: No Has patient  had any suicidal intent within the past 6 months prior to admission? : No Is patient at risk for suicide?: No (Pt denied suicide) Suicidal Plan?: No Has patient had any suicidal plan within the past 6 months prior to admission? : No Access to Means: No What has been your use of drugs/alcohol within the last 12 months?: Alcohol Previous Attempts/Gestures: No How many times?: 0 Other Self Harm Risks: None Reported Triggers for Past Attempts: None known Intentional Self Injurious Behavior: None Family Suicide History: Unknown Recent stressful life event(s): Conflict (Comment) (with boyfriend) Persecutory voices/beliefs?: No Depression: Yes Depression Symptoms: Insomnia, Tearfulness, Isolating, Fatigue, Guilt, Feeling worthless/self pity, Feeling angry/irritable Substance abuse history and/or treatment for substance abuse?: Yes Suicide prevention information given to non-admitted patients: Yes  Risk to Others within the past 6 months Homicidal Ideation: No Does patient have any lifetime risk of violence toward others beyond the six months prior to admission? : No Thoughts of Harm to Others: No Current Homicidal Intent: No Current Homicidal Plan: No Access to Homicidal Means: No Identified Victim: N/A History of harm to others?: No Assessment of Violence: None Noted Violent Behavior Description: None Noted Does patient have access to weapons?: No Criminal Charges Pending?: No Does patient have a court date: No Is patient on probation?: No  Psychosis Hallucinations: None noted Delusions: None noted  Mental Status Report Appearance/Hygiene: Other (Comment) (Street clothes) Eye Contact: Fair Motor Activity: Freedom of movement Speech: Logical/coherent, Slurred Level of Consciousness: Alert Mood: Depressed Affect: Labile, Depressed Anxiety Level: Moderate Thought Processes: Coherent, Relevant Judgement: Partial Orientation: Person, Place, Time, Situation, Appropriate for  developmental age Obsessive Compulsive Thoughts/Behaviors: None  Cognitive Functioning Concentration: Fair Memory: Recent Intact, Remote Intact IQ: Average Insight: Fair Impulse Control: Poor Appetite: Good Weight Loss: 0 Weight Gain: 0 Sleep: Decreased Total Hours of Sleep: 3 Vegetative Symptoms: None  ADLScreening [facility] Assessment Services) Patient's cognitive ability adequate to safely complete daily activities?: Yes Patient able to express need for assistance with ADLs?: Yes Independently performs ADLs?: Yes (appropriate for developmental age)  Prior Inpatient Therapy Prior Inpatient Therapy: Yes (Pt denied past inpatient Tx) Prior Therapy Dates: 06/2014 Prior Therapy Facilty/Provider(s): [facility] - [id number] Reason for Treatment: Alcohol Use   Prior Outpatient Therapy Prior Outpatient Therapy: No Does patient have an ACCT team?: No Does patient have Intensive In-House Services?  : No Does patient have [facility] services? : No Does patient have P4CC services?: No  ADL Screening (condition at time of admission) Patient's cognitive ability adequate to safely complete daily activities?: Yes Patient able to express need for assistance with ADLs?: Yes Independently performs ADLs?: Yes (appropriate for developmental age)       Abuse/Neglect Assessment (Assessment to be complete while patient is alone) Physical Abuse: Denies Verbal Abuse: Denies Sexual Abuse: Denies Exploitation of patient/patient's resources: Denies Self-Neglect: Denies Values / Beliefs Cultural Requests During Hospitalization: None Spiritual Requests During Hospitalization: None Consults Spiritual Care Consult Needed: No Social Work Consult Needed: No Merchant navy officer (For Healthcare) Does Patient Have a Medical Advance Directive?: No    Additional Information 1:1 In Past 12 Months?: No CIRT Risk: No Elopement Risk: No Does patient have medical clearance?: Yes  Child/Adolescent  Assessment Running Away Risk: Denies (Patient is an adult)  Disposition:  Disposition Initial Assessment  Completed for this Encounter: Yes Disposition of Patient: Referred to [facility] Consult) Patient referred to: Other (Comment) [facility] Consult)  On Site Evaluation by:   Reviewed with Physician:    Wilmon Arms 09/15/2016 9:43 PM

## 2016-09-15 NOTE — ED Notes (Signed)
BEHAVIORAL HEALTH ROUNDING  Patient sleeping: No.  Patient alert and oriented: yes  Behavior appropriate: Yes. ; If no, describe:  Nutrition and fluids offered: Yes  Toileting and hygiene offered: Yes  Sitter present: not applicable, Q 15 min safety rounds and observation.  Law enforcement present: Yes ODS  

## 2016-09-15 NOTE — ED Triage Notes (Signed)
Patient here for IVC. Per officer patient sent text messages to her boyfriend stating that she wanted her hurt herself. Patient denies SI at this times.

## 2016-09-15 NOTE — ED Provider Notes (Signed)
[facility] Emergency Department Provider Note   ____________________________________________    I have reviewed the triage vital signs and the nursing notes.   HISTORY  Chief Complaint Psychiatric Evaluation     HPI Suzanne [name] is a 31 y.o. female who presents with suicidal ideation. Patient admits to drinking alcohol tonight, apparently she text did her boyfriend telling him that she was depressed and planning on cutting her wrists. She denies a history of SI in the past. She reports she has not taken any pills in an attempt to harm herself. She reports she has not cut herself. This all started this afternoon approximately 2 hours prior to arrival.   Past Medical History:  Diagnosis Date  . Bacterial infection   . Brain cyst   . Depression   . Dysplasia of cervix, low grade (CIN 1)   . ETOH abuse   . History of chicken pox   . HPV (human papilloma virus) anogenital infection   . LGSIL (low grade squamous intraepithelial dysplasia)   . UTI (urinary tract infection)   . Vaginal trichomoniasis     Patient Active Problem List   Diagnosis Date Noted  . Depression, major, recurrent, moderate (HCC) 07/08/2014  . Alcohol use disorder, severe, dependence (HCC) 07/07/2014    Past Surgical History:  Procedure Laterality Date  . SKIN GRAFT      Prior to Admission medications   Not on File     Allergies Patient has no known allergies.  Family History  Problem Relation Age of Onset  . Diabetes Mother   . Cancer Mother     Social History Social History  Substance Use Topics  . Smoking status: Current Every Day Smoker    Packs/day: 1.00    Types: Cigarettes  . Smokeless tobacco: Never Used  . Alcohol use Yes     Comment: occ    Review of Systems  Constitutional: No fever/chills Eyes: No visual changes.  ENT: No sore throat. Cardiovascular: Denies chest pain. Respiratory: Denies shortness of breath. Gastrointestinal:  No abdominal pain.   Genitourinary: Negative for dysuria. Musculoskeletal: Negative for back pain. Skin: Negative for rash. Neurological: Negative for headaches or weakness   ____________________________________________   PHYSICAL EXAM:  VITAL SIGNS: ED Triage Vitals  Enc Vitals Group     BP 09/15/16 1942 121/86     Pulse Rate 09/15/16 1942 (!) 130     Resp 09/15/16 1942 18     Temp 09/15/16 1942 98.5 F (36.9 C)     Temp Source 09/15/16 1942 Oral     SpO2 09/15/16 1942 97 %     Weight 09/15/16 1943 52.2 kg (115 lb)     Height 09/15/16 1943 1.549 m (5\' 1" )     Head Circumference --      Peak Flow --      Pain Score --      Pain Loc --      Pain Edu? --      Excl. in GC? --     Constitutional: Alert and oriented. No acute distress.  Eyes: Conjunctivae are normal.  Head: Atraumatic. Nose: No congestion/rhinnorhea.  Cardiovascular: Tachycardia, regular rhythm. Grossly normal heart sounds.  Good peripheral circulation. Respiratory: Normal respiratory effort.  No retractions. Lungs CTAB. Gastrointestinal: Soft and nontender. No distention.  No CVA tenderness. Genitourinary: deferred Musculoskeletal: No lower extremity tenderness nor edema.  Warm and well perfused Neurologic:  Normal speech and language. No gross focal neurologic deficits are  appreciated.  Skin:  Skin is warm, dry and intact. No rash noted. Psychiatric: Mood and affect are normal. Speech and behavior are normal.  ____________________________________________   LABS (all labs ordered are listed, but only abnormal results are displayed)  Labs Reviewed  COMPREHENSIVE METABOLIC PANEL - Abnormal; Notable for the following:       Result Value   CO2 19 (*)    Calcium 8.8 (*)    Anion gap 16 (*)    All other components within normal limits  ETHANOL - Abnormal; Notable for the following:    Alcohol, Ethyl (B) 297 (*)    All other components within normal limits  ACETAMINOPHEN LEVEL - Abnormal; Notable for  the following:    Acetaminophen (Tylenol), Serum <10 (*)    All other components within normal limits  CBC - Abnormal; Notable for the following:    MCV 102.5 (*)    MCH 35.3 (*)    All other components within normal limits  URINE DRUG SCREEN, QUALITATIVE ([facility] ONLY) - Abnormal; Notable for the following:    Cannabinoid 50 Ng, Ur [place] POSITIVE (*)    All other components within normal limits  SALICYLATE LEVEL  BASIC METABOLIC PANEL  POC URINE PREG, ED  POCT PREGNANCY, URINE   ____________________________________________  EKG  None ____________________________________________  RADIOLOGY  None ____________________________________________   PROCEDURES  Procedure(s) performed: No    Critical Care performed: No ____________________________________________   INITIAL IMPRESSION / ASSESSMENT AND PLAN / ED COURSE  Pertinent labs & imaging results that were available during my care of the patient were reviewed by me and considered in my medical decision making (see chart for details).  Patient well-appearing and in no acute distress. On exam she is tachycardic but otherwise exam is reassuring. Lab work significant for elevated ethanol level as well as borderline anion gap. Suspect this is related to the acute intoxication, we will give IV fluids and recheck BMP  Patient is under involuntary commitment and will be seen by psychiatry    ____________________________________________   FINAL CLINICAL IMPRESSION(S) / ED DIAGNOSES  Final diagnoses:  Suicidal ideation  Alcohol abuse      NEW MEDICATIONS STARTED DURING THIS VISIT:  New Prescriptions   No medications on file     Note:  This document was prepared using Dragon voice recognition software and may include unintentional dictation errors.    Jene Every, MD 09/15/16 2225

## 2016-09-15 NOTE — ED Notes (Signed)
Report given to [facility] MD. Computer placed in room and pt instructed on process and is understanding.

## 2016-09-16 LAB — GLUCOSE, CAPILLARY: Glucose-Capillary: 92 mg/dL (ref 65–99)

## 2016-09-16 NOTE — ED Notes (Signed)
BEHAVIORAL HEALTH ROUNDING Patient sleeping: Yes.   Patient alert and oriented: not applicable SLEEPING Behavior appropriate: Yes.  ; If no, describe: SLEEPING Nutrition and fluids offered: No SLEEPING Toileting and hygiene offered: NoSLEEPING Sitter present: not applicable, Q 15 min safety rounds and observation. Law enforcement present: Yes ODS 

## 2016-09-16 NOTE — ED Notes (Signed)
Pt has attempted to call numerous friends to give her a ride home and she is unable to locate anyone. Dr Don Perking informed and pt will sleep here till the morning and then uber home.

## 2016-09-16 NOTE — Discharge Instructions (Signed)
You have been seen in the Emergency Department (ED)  today for a psychiatric complaint.  You have been evaluated by psychiatry and we believe you are safe to be discharged from the hospital.   ° °Please return to the Emergency Department (ED)  immediately if you have ANY thoughts of hurting yourself or anyone else, so that we may help you. ° °Please avoid alcohol and drug use. ° °Follow up with your doctor and/or therapist as soon as possible regarding today's ED  visit.  ° °You may call crisis hotline for [place] at [phone]. ° °

## 2016-09-16 NOTE — ED Notes (Signed)
Pt awake, alert and oriented. Ate breakfast. Given clothes to change. Will discharge pt and pt will take uber home.

## 2016-09-16 NOTE — ED Provider Notes (Signed)
-----------------------------------------   12:42 AM on 09/16/2016 -----------------------------------------   Blood pressure 116/78, pulse (!) 102, temperature 98.2 F (36.8 C), temperature source Oral, resp. rate 19, height 5\' 1"  (1.549 m), weight 52.2 kg (115 lb), last menstrual period 09/13/2016, SpO2 95 %.  Patient has been evaluated by psychiatry and cleared for discharge. IVC lifted by Dr. Karleen Dolphin. Patient's labs have been reviewed with no acute findings. Patient will be discharged at this time to home when a sober driver is here to pick her up.    Nita Sickle, MD 09/16/16 [phone]

## 2016-09-16 NOTE — ED Notes (Signed)
BEHAVIORAL HEALTH ROUNDING  Patient sleeping: No.  Patient alert and oriented: yes  Behavior appropriate: Yes. ; If no, describe:  Nutrition and fluids offered: Yes  Toileting and hygiene offered: Yes  Sitter present: not applicable, Q 15 min safety rounds and observation.  Law enforcement present: Yes ODS  

## 2016-09-16 NOTE — ED Notes (Signed)
Pt ambulated to br without assistance.

## 2017-03-15 ENCOUNTER — Emergency Department
Admission: EM | Admit: 2017-03-15 | Discharge: 2017-03-15 | Disposition: A | Discharge status: Home / Self Care | Payer: Self-pay | Attending: Emergency Medicine | Admitting: Emergency Medicine

## 2017-03-15 ENCOUNTER — Other Ambulatory Visit: Payer: Self-pay

## 2017-03-15 ENCOUNTER — Encounter: Payer: Self-pay | Admitting: Emergency Medicine

## 2017-03-15 DIAGNOSIS — R69 Illness, unspecified: Secondary | ICD-10-CM

## 2017-03-15 DIAGNOSIS — J111 Influenza due to unidentified influenza virus with other respiratory manifestations: Secondary | ICD-10-CM | POA: Insufficient documentation

## 2017-03-15 DIAGNOSIS — F329 Major depressive disorder, single episode, unspecified: Secondary | ICD-10-CM | POA: Insufficient documentation

## 2017-03-15 DIAGNOSIS — F17210 Nicotine dependence, cigarettes, uncomplicated: Secondary | ICD-10-CM | POA: Insufficient documentation

## 2017-03-15 MED ORDER — PSEUDOEPH-BROMPHEN-DM 30-2-10 MG/5ML PO SYRP: 5.0000 mL | ORAL_SOLUTION | Freq: Four times a day (QID) | ORAL | 0 refills | Status: DC | PRN

## 2017-03-15 MED ORDER — OSELTAMIVIR PHOSPHATE 75 MG PO CAPS: 75.0000 mg | ORAL_CAPSULE | Freq: Two times a day (BID) | ORAL | 0 refills | Status: AC

## 2017-03-15 NOTE — ED Provider Notes (Signed)
[facility] Emergency Department Provider Note  ____________________________________________  Time seen: Approximately 2:48 PM  I have reviewed the triage vital signs and the nursing notes.   HISTORY  Chief Complaint Generalized Body Aches    HPI Suzanne [name] is a 32 y.o. female that presents to the emergency department for evaluation of chills, headache, body aches, nasal congestion, nonproductive cough for 2 days.  She has not checked her temperature but has had chills. Patient's niece has influenza.  No alleviating measures have been attempted.  No nausea, vomiting, abdominal pain, diarrhea, constipation.   Past Medical History:  Diagnosis Date  . Bacterial infection   . Brain cyst   . Depression   . Dysplasia of cervix, low grade (CIN 1)   . ETOH abuse   . History of chicken pox   . HPV (human papilloma virus) anogenital infection   . LGSIL (low grade squamous intraepithelial dysplasia)   . UTI (urinary tract infection)   . Vaginal trichomoniasis     Patient Active Problem List   Diagnosis Date Noted  . Depression, major, recurrent, moderate (HCC) 07/08/2014  . Alcohol use disorder, severe, dependence (HCC) 07/07/2014    Past Surgical History:  Procedure Laterality Date  . SKIN GRAFT      Prior to Admission medications   Medication Sig Start Date End Date Taking? Authorizing Provider  brompheniramine-pseudoephedrine-DM 30-2-10 MG/5ML syrup Take 5 mLs by mouth 4 (four) times daily as needed. 03/15/17   Enid DerryWagner, Armando Lauman, PA-C  oseltamivir (TAMIFLU) 75 MG capsule Take 1 capsule (75 mg total) by mouth 2 (two) times daily for 5 days. 03/15/17 03/20/17  Enid DerryWagner, Genene Kilman, PA-C    Allergies Patient has no known allergies.  Family History  Problem Relation Age of Onset  . Diabetes Mother   . Cancer Mother     Social History Social History   Tobacco Use  . Smoking status: Current Every Day Smoker    Packs/day: 1.00    Types: Cigarettes   . Smokeless tobacco: Never Used  Substance Use Topics  . Alcohol use: Yes    Comment: occ  . Drug use: No     Review of Systems  Constitutional: Positive for chills. Eyes: No visual changes. No discharge. ENT: Positive for congestion and rhinorrhea. Cardiovascular: No chest pain. Respiratory: Positive for cough. No SOB. Gastrointestinal: No abdominal pain.  No nausea, no vomiting.  No diarrhea.  No constipation. Musculoskeletal: Positive for body aches. Skin: Negative for rash, abrasions, lacerations, ecchymosis. Neurological: Positive for headache.   ____________________________________________   PHYSICAL EXAM:  VITAL SIGNS: ED Triage Vitals [03/15/17 1324]  Enc Vitals Group     BP 115/78     Pulse Rate 93     Resp 20     Temp 98.5 F (36.9 C)     Temp Source Oral     SpO2 99 %     Weight 120 lb (54.4 kg)     Height 5\' 2"  (1.575 m)     Head Circumference      Peak Flow      Pain Score 3     Pain Loc      Pain Edu?      Excl. in GC?      Constitutional: Alert and oriented. Well appearing and in no acute distress. Eyes: Conjunctivae are normal. PERRL. EOMI. No discharge. Head: Atraumatic. ENT: No frontal and maxillary sinus tenderness.      Ears: Tympanic membranes pearly gray with good landmarks.  No discharge.      Nose: Mild congestion/rhinnorhea.      Mouth/Throat: Mucous membranes are moist. Oropharynx non-erythematous. Tonsils not enlarged. No exudates. Uvula midline. Neck: No stridor.   Hematological/Lymphatic/Immunilogical: No cervical lymphadenopathy. Cardiovascular: Normal rate, regular rhythm.  Good peripheral circulation. Respiratory: Normal respiratory effort without tachypnea or retractions. Lungs CTAB. Good air entry to the bases with no decreased or absent breath sounds. Gastrointestinal: Bowel sounds 4 quadrants. Soft and nontender to palpation. No guarding or rigidity. No palpable masses. No distention. Musculoskeletal: Full range of motion  to all extremities. No gross deformities appreciated. Neurologic:  Normal speech and language. No gross focal neurologic deficits are appreciated.  Skin:  Skin is warm, dry and intact. No rash noted.   ____________________________________________   LABS (all labs ordered are listed, but only abnormal results are displayed)  Labs Reviewed - No data to display ____________________________________________  EKG   ____________________________________________  RADIOLOGY   No results found.  ____________________________________________    PROCEDURES  Procedure(s) performed:    Procedures    Medications - No data to display   ____________________________________________   INITIAL IMPRESSION / ASSESSMENT AND PLAN / ED COURSE  Pertinent labs & imaging results that were available during my care of the patient were reviewed by me and considered in my medical decision making (see chart for details).  Review of the [place] CSRS was performed in accordance of the [facility] prior to dispensing any controlled drugs.     Patient presented to the emergency department for evaluation of flu symptoms.  Symptoms are consistent with influenza.  Patient was offered influenza test and elected to be treated based on symptoms.  Vital signs and exam are reassuring. Patient appears well and is staying well hydrated. Patient should alternate tylenol and ibuprofen for fever. Patient feels comfortable going home. Patient will be discharged home with prescriptions for Tamiflu and Bromfed. Patient is to follow up with PCP as needed or otherwise directed. Patient is given ED precautions to return to the ED for any worsening or new symptoms.     ____________________________________________  FINAL CLINICAL IMPRESSION(S) / ED DIAGNOSES  Final diagnoses:  Influenza-like illness      NEW MEDICATIONS STARTED DURING THIS VISIT:  ED Discharge Orders        Ordered    oseltamivir (TAMIFLU) 75 MG  capsule  2 times daily     03/15/17 1457    brompheniramine-pseudoephedrine-DM 30-2-10 MG/5ML syrup  4 times daily PRN     03/15/17 1457          This chart was dictated using voice recognition software/Dragon. Despite best efforts to proofread, errors can occur which can change the meaning. Any change was purely unintentional.    Enid Derry, PA-C 03/15/17 1529    Sharyn Creamer, MD 03/15/17 [phone]

## 2017-03-15 NOTE — ED Triage Notes (Signed)
Presents with cough and congestion with body aches for the past 2-3 days  Afebrile on arrival  Subjective fever at home

## 2017-10-15 ENCOUNTER — Emergency Department (HOSPITAL_COMMUNITY): Payer: Medicaid Other

## 2017-10-15 ENCOUNTER — Encounter (HOSPITAL_COMMUNITY): Payer: Self-pay | Admitting: Emergency Medicine

## 2017-10-15 ENCOUNTER — Emergency Department (HOSPITAL_COMMUNITY)
Admission: EM | Admit: 2017-10-15 | Discharge: 2017-10-15 | Disposition: A | Discharge status: Home / Self Care | Payer: Medicaid Other | Attending: Emergency Medicine | Admitting: Emergency Medicine

## 2017-10-15 DIAGNOSIS — K2900 Acute gastritis without bleeding: Secondary | ICD-10-CM | POA: Insufficient documentation

## 2017-10-15 DIAGNOSIS — Z79899 Other long term (current) drug therapy: Secondary | ICD-10-CM | POA: Diagnosis not present

## 2017-10-15 DIAGNOSIS — F17210 Nicotine dependence, cigarettes, uncomplicated: Secondary | ICD-10-CM | POA: Diagnosis not present

## 2017-10-15 DIAGNOSIS — R1013 Epigastric pain: Secondary | ICD-10-CM | POA: Diagnosis present

## 2017-10-15 LAB — URINALYSIS, ROUTINE W REFLEX MICROSCOPIC
Glucose, UA: NEGATIVE mg/dL
Hgb urine dipstick: NEGATIVE
KETONES UR: NEGATIVE mg/dL
Leukocytes, UA: NEGATIVE
NITRITE: NEGATIVE
PH: 6.5 (ref 5.0–8.0)

## 2017-10-15 LAB — CBC WITH DIFFERENTIAL/PLATELET
Basophils Absolute: 0 10*3/uL (ref 0.0–0.1)
Basophils Relative: 0 %
Eosinophils Absolute: 0.1 10*3/uL (ref 0.0–0.7)
Eosinophils Relative: 2 %
HEMATOCRIT: 40.4 % (ref 36.0–46.0)
HEMOGLOBIN: 13.8 g/dL (ref 12.0–15.0)
LYMPHS ABS: 1.9 10*3/uL (ref 0.7–4.0)
LYMPHS PCT: 49 %
MCH: 33.6 pg (ref 26.0–34.0)
MCHC: 34.2 g/dL (ref 30.0–36.0)
MCV: 98.3 fL (ref 78.0–100.0)
MONO ABS: 0.2 10*3/uL (ref 0.1–1.0)
MONOS PCT: 4 %
NEUTROS ABS: 1.7 10*3/uL (ref 1.7–7.7)
NEUTROS PCT: 45 %
Platelets: 228 10*3/uL (ref 150–400)
RBC: 4.11 MIL/uL (ref 3.87–5.11)
RDW: 13.2 % (ref 11.5–15.5)
WBC: 3.8 10*3/uL — ABNORMAL LOW (ref 4.0–10.5)

## 2017-10-15 LAB — POC URINE PREG, ED: PREG TEST UR: NEGATIVE

## 2017-10-15 LAB — COMPREHENSIVE METABOLIC PANEL
ALBUMIN: 4.2 g/dL (ref 3.5–5.0)
ALK PHOS: 65 U/L (ref 38–126)
ALT: 12 U/L (ref 0–44)
ANION GAP: 11 (ref 5–15)
AST: 18 U/L (ref 15–41)
BUN: 15 mg/dL (ref 6–20)
CALCIUM: 9.2 mg/dL (ref 8.9–10.3)
CHLORIDE: 105 mmol/L (ref 98–111)
CO2: 27 mmol/L (ref 22–32)
Creatinine, Ser: 0.66 mg/dL (ref 0.44–1.00)
GFR calc non Af Amer: >60 mL/min (ref >60)
GLUCOSE: 88 mg/dL (ref 70–99)
Potassium: 3.6 mmol/L (ref 3.5–5.1)
SODIUM: 143 mmol/L (ref 135–145)
Total Bilirubin: 0.7 mg/dL (ref 0.3–1.2)
Total Protein: 7 g/dL (ref 6.5–8.1)

## 2017-10-15 LAB — URINALYSIS, MICROSCOPIC (REFLEX)

## 2017-10-15 LAB — LIPASE, BLOOD: Lipase: 30 U/L (ref 11–51)

## 2017-10-15 MED ORDER — SUCRALFATE 1 G PO TABS: 1.0000 g | ORAL_TABLET | Freq: Three times a day (TID) | ORAL | 0 refills | Status: DC

## 2017-10-15 MED ORDER — GI COCKTAIL ~~LOC~~
30.0000 mL | Freq: Once | ORAL | Status: AC
  Administered 2017-10-15: 30 mL via ORAL
  Filled 2017-10-15: qty 30

## 2017-10-15 MED ORDER — SODIUM CHLORIDE 0.9 % IV BOLUS
1000.0000 mL | Freq: Once | INTRAVENOUS | Status: AC
  Administered 2017-10-15: 1000 mL via INTRAVENOUS

## 2017-10-15 MED ORDER — FAMOTIDINE IN NACL 20-0.9 MG/50ML-% IV SOLN
20.0000 mg | Freq: Once | INTRAVENOUS | Status: AC
  Administered 2017-10-15: 20 mg via INTRAVENOUS
  Filled 2017-10-15: qty 50

## 2017-10-15 MED ORDER — ONDANSETRON HCL 4 MG PO TABS: 4.0000 mg | ORAL_TABLET | Freq: Four times a day (QID) | ORAL | 0 refills | Status: DC

## 2017-10-15 MED ORDER — OMEPRAZOLE 20 MG PO CPDR: 20.0000 mg | DELAYED_RELEASE_CAPSULE | Freq: Every day | ORAL | 0 refills | Status: DC

## 2017-10-15 NOTE — ED Provider Notes (Signed)
[facility]-EMERGENCY DEPT Provider Note   CSN: [id number] Arrival date & time: 10/15/17  4098     History   Chief Complaint Chief Complaint  Patient presents with  . no appetite  . Gastroesophageal Reflux    HPI Suzanne [name] is a 32 y.o. female.  The history is provided by the patient, a relative and medical records. No language interpreter was used.  Gastroesophageal Reflux    Suzanne [name] is a 32 y.o. female who presents to the Emergency Department complaining of abdominal pain. She presents to the emergency department complaining of epigastric pain for the last several months. Pain has been persistently worsening. Her symptoms are waxing and waning and she describes it as a reflux and indigestion type station. She has associated nausea and decreased oral intake. She does drink alcohol heavily. She was in rehab one year ago. Prior to rehab she was drinking liquor. Following rehab she began drinking wine and beer. She drinks 1 to 2 pints a few days a week. Her last drink was on Saturday. She endorses fatigue and feeling poorly. No fevers, vomiting, dysuria. She does have mild diarrhea. She has tried tums and Zantac with no significant improvement in her symptoms. Past Medical History:  Diagnosis Date  . Bacterial infection   . Brain cyst   . Depression   . Dysplasia of cervix, low grade (CIN 1)   . ETOH abuse   . History of chicken pox   . HPV (human papilloma virus) anogenital infection   . LGSIL (low grade squamous intraepithelial dysplasia)   . UTI (urinary tract infection)   . Vaginal trichomoniasis     Patient Active Problem List   Diagnosis Date Noted  . Depression, major, recurrent, moderate (HCC) 07/08/2014  . Alcohol use disorder, severe, dependence (HCC) 07/07/2014    Past Surgical History:  Procedure Laterality Date  . SKIN GRAFT       OB History    Gravida  2   Para  1   Term      Preterm      AB      Living          SAB      TAB      Ectopic      Multiple      Live Births               Home Medications    Prior to Admission medications   Medication Sig Start Date End Date Taking? Authorizing Provider  Aspirin-Salicylamide-Caffeine (BC HEADACHE POWDER PO) Take 1 packet by mouth daily as needed (headache).   Yes [provider]  brompheniramine-pseudoephedrine-DM 30-2-10 MG/5ML syrup Take 5 mLs by mouth 4 (four) times daily as needed. Patient not taking: Reported on 10/15/2017 03/15/17   Enid Derry, PA-C  omeprazole (PRILOSEC) 20 MG capsule Take 1 capsule (20 mg total) by mouth daily. 10/15/17   Tilden Fossa, MD  ondansetron (ZOFRAN) 4 MG tablet Take 1 tablet (4 mg total) by mouth every 6 (six) hours. 10/15/17   Tilden Fossa, MD  sucralfate (CARAFATE) 1 g tablet Take 1 tablet (1 g total) by mouth 4 (four) times daily -  with meals and at bedtime. 10/15/17   Tilden Fossa, MD    Family History Family History  Problem Relation Age of Onset  . Diabetes Mother   . Cancer Mother     Social History Social History   Tobacco Use  . Smoking status: Current  Every Day Smoker    Packs/day: 1.00    Types: Cigarettes  . Smokeless tobacco: Never Used  Substance Use Topics  . Alcohol use: Yes  . Drug use: No    Types: Marijuana     Allergies   Patient has no known allergies.   Review of Systems Review of Systems  All other systems reviewed and are negative.    Physical Exam Updated Vital Signs BP 122/60   Pulse (!) 58   Temp 98.2 F (36.8 C)   Resp 16   LMP 10/06/2017   SpO2 100%   Physical Exam  Constitutional: She is oriented to person, place, and time. She appears well-developed and well-nourished.  HENT:  Head: Normocephalic and atraumatic.  Cardiovascular: Normal rate and regular rhythm.  No murmur heard. Pulmonary/Chest: Effort normal and breath sounds normal. No respiratory distress.  Abdominal: Soft. There is no tenderness. There is no  rebound and no guarding.  Musculoskeletal: She exhibits no edema or tenderness.  Neurological: She is alert and oriented to person, place, and time.  Skin: Skin is warm and dry.  Psychiatric: She has a normal mood and affect. Her behavior is normal.  Nursing note and vitals reviewed.    ED Treatments / Results  Labs (all labs ordered are listed, but only abnormal results are displayed) Labs Reviewed  CBC WITH DIFFERENTIAL/PLATELET - Abnormal; Notable for the following components:      Result Value   WBC 3.8 (*)    All other components within normal limits  URINALYSIS, ROUTINE W REFLEX MICROSCOPIC - Abnormal; Notable for the following components:   APPearance HAZY (*)    Specific Gravity, Urine >1.030 (*)    Bilirubin Urine SMALL (*)    Protein, ur TRACE (*)    All other components within normal limits  URINALYSIS, MICROSCOPIC (REFLEX) - Abnormal; Notable for the following components:   Bacteria, UA RARE (*)    All other components within normal limits  COMPREHENSIVE METABOLIC PANEL  LIPASE, BLOOD  POC URINE PREG, ED    EKG None  Radiology Koreas Abdomen Complete  Result Date: 10/15/2017 CLINICAL DATA:  Abdominal pain, poor appetite EXAM: ABDOMEN ULTRASOUND COMPLETE COMPARISON:  None. FINDINGS: Gallbladder: The gallbladder is visualized and no gallstones are noted. There is no pain over the gallbladder with compression. Common bile duct: Diameter: The common bile duct is normal measuring 4.1 mm in diameter. Liver: The liver has a normal echogenic pattern. No focal hepatic abnormality is seen. Portal vein is patent on color Doppler imaging with normal direction of blood flow towards the liver. IVC: No abnormality visualized. Pancreas: The head and mid body of the pancreas appear normal. Portions of the tail of the pancreas are obscured by bowel gas. Spleen: The spleen measures 7.3 cm. Right Kidney: Length: 10.3 cm.  No hydronephrosis is seen. Left Kidney: Length: 9.7 cm.  No  hydronephrosis is noted. Abdominal aorta: The abdominal aorta is normal in caliber. Other findings: None. IMPRESSION: Negative abdominal ultrasound. No gallstones. No ductal dilatation. Only the tail the pancreas is obscured. Electronically Signed   By: Dwyane DeePaul  Barry M.D.   On: 10/15/2017 11:42    Procedures Procedures (including critical care time)  Medications Ordered in ED Medications  famotidine (PEPCID) IVPB 20 mg premix (0 mg Intravenous Stopped 10/15/17 1036)  gi cocktail (Maalox,Lidocaine,Donnatal) (30 mLs Oral Given 10/15/17 0945)  sodium chloride 0.9 % bolus 1,000 mL (0 mLs Intravenous Stopped 10/15/17 1149)     Initial Impression / Assessment and  Plan / ED Course  I have reviewed the triage vital signs and the nursing notes.  Pertinent labs & imaging results that were available during my care of the patient were reviewed by me and considered in my medical decision making (see chart for details).     Patient with history of alcohol abuse here for evaluation of epigastric pain and reflux, poor oral intake after discontinue alcohol several days ago. She is non-toxic appearing on examination with minimal epigastric tenderness. UA concerning for dehydration was labs otherwise unremarkable. History, presentation and exam are not consistent with cholecystitis, pancreatitis, perforated viscous. She is feeling partially improved following treatment in the department. No current evidence of acute EtOH withdrawal. Discussed home care and treatment for gastritis. Discussed outpatient follow-up as well as return precautions.  Final Clinical Impressions(s) / ED Diagnoses   Final diagnoses:  Acute gastritis without hemorrhage, unspecified gastritis type    ED Discharge Orders         Ordered    omeprazole (PRILOSEC) 20 MG capsule  Daily     10/15/17 1239    sucralfate (CARAFATE) 1 g tablet  3 times daily with meals & bedtime     10/15/17 1239    ondansetron (ZOFRAN) 4 MG tablet  Every 6  hours     10/15/17 1239           Tilden Fossa, MD 10/15/17 1459

## 2017-10-15 NOTE — ED Triage Notes (Signed)
Pt reports that she has lost weight, no appetite and acid reflux for couple months. Does use ETOH last drink was this past Saturday. Pt c/o heart burn and no energy.

## 2017-11-28 ENCOUNTER — Other Ambulatory Visit: Payer: Self-pay

## 2017-11-28 ENCOUNTER — Emergency Department (HOSPITAL_COMMUNITY)
Admission: EM | Admit: 2017-11-28 | Discharge: 2017-11-28 | Disposition: A | Discharge status: Home / Self Care | Payer: Medicaid Other | Attending: Emergency Medicine | Admitting: Emergency Medicine

## 2017-11-28 ENCOUNTER — Encounter (HOSPITAL_COMMUNITY): Payer: Self-pay

## 2017-11-28 DIAGNOSIS — K029 Dental caries, unspecified: Secondary | ICD-10-CM | POA: Insufficient documentation

## 2017-11-28 DIAGNOSIS — K0889 Other specified disorders of teeth and supporting structures: Secondary | ICD-10-CM | POA: Insufficient documentation

## 2017-11-28 DIAGNOSIS — Z79899 Other long term (current) drug therapy: Secondary | ICD-10-CM | POA: Diagnosis not present

## 2017-11-28 DIAGNOSIS — F17210 Nicotine dependence, cigarettes, uncomplicated: Secondary | ICD-10-CM | POA: Diagnosis not present

## 2017-11-28 MED ORDER — CHLORHEXIDINE GLUCONATE 0.12 % MT SOLN: 15.0000 mL | Freq: Two times a day (BID) | OROMUCOSAL | 0 refills | Status: DC

## 2017-11-28 MED ORDER — PENICILLIN V POTASSIUM 500 MG PO TABS: 500.0000 mg | ORAL_TABLET | Freq: Four times a day (QID) | ORAL | 0 refills | Status: AC

## 2017-11-28 MED ORDER — HYDROCODONE-ACETAMINOPHEN 5-325 MG PO TABS
1.0000 | ORAL_TABLET | Freq: Once | ORAL | Status: AC
  Administered 2017-11-28: 1 via ORAL
  Filled 2017-11-28: qty 1

## 2017-11-28 NOTE — Discharge Instructions (Addendum)
Please return to the Emergency Department for any new or worsening symptoms or if your symptoms do not improve. Please be sure to follow up with your Primary Care Physician as soon as possible regarding your visit today. If you do not have a Primary Doctor please use the resources below to establish one. Please use the antibiotic penicillin as prescribed for your dental pain.  Please complete a full course of antibiotics even if your pain resolves prior to completion. Please go to your dental appointment tomorrow morning with Dr. Leanord Asal. You may use the Peridex mouth rinse for comfort, do not swallow the mouth rinse.  You may also use heating pads and cooling packs to help with your dental pain.  You may use over-the-counter Tylenol as directed on the packaging to help with your pain.  Contact a doctor if: Your pain is not helped with medicines. Your symptoms are worse. You have new symptoms. Get help right away if: You cannot open your mouth. You are having trouble breathing or swallowing. You have a fever. Your face, neck, or jaw is puffy (swollen).  Do not take your medicine if  develop an itchy rash, swelling in your mouth or lips, or difficulty breathing.   RESOURCE GUIDE  Chronic Pain Problems: Contact [facility]  [phone] Patients need to be referred by their primary care doctor.  Insufficient Money for Medicine: Contact United Way:  call "211" or [facility] 218-002-9845.  No Primary Care Doctor: Call [facility] Connect  [phone] - can help you locate a primary care doctor that  accepts your insurance, provides certain services, etc. Physician Referral Service- [phone]  Agencies that provide inexpensive medical care: [facility]  [phone] [facility]  [phone] [facility]  [phone] [facility]  [phone] [facility]  [phone] [facility]  [phone]  Medicaid-accepting  [facility] Providers: [facility]- [address], [address]  [phone], Mon-Fri 9am-7pm, Sat 9am-1pm [facility]- [address] [place], [address] [place]  [phone] [facility]- [address] [place]  [phone] [facility]- [address]  [phone] Renaye Rakers- [address] [place], [address], [phone]  Only accepts [place] patients after they have their name  applied to their card  Self Pay (no insurance) in [facility]: Sickle Cell Patients: Dr Willey Blade, [facility] Medicine  [address] [place], [phone] [facility]- [address] [place]  [phone]       [facility] [place]- [address]       -     [facility]- see information above (Speak to [employer] if you do not have insurance)       -  [facility]- [address] [place], [phone]       -  [facility]- [address],  [phone]       -  [facility]- [address], [phone]       -  Dr Julio Sicks-  [address], [address], [place], [phone]       -  [facility]- [address] [phone]       -  [facility]- [address] [phone], also [address] [phone]       -  [facility]- [address], [phone], 1st & 3rd Saturday   every month, 10am-1pm  1) Find a Doctor and Pay Out of Pocket Although you won't have to find out who is covered by your insurance plan, it is a good idea to ask around and get recommendations. You will then need to call the office and see if the doctor you have chosen will accept you as a new patient and what types of options they offer for patients who are self-pay. Some doctors offer discounts or will set up payment plans for their patients who do not have insurance, but you will need to ask so you aren't surprised when you get to your  appointment.  2) Contact Your Local Health Department Not all health departments have doctors that can see patients for sick visits, but many do, so it is worth a call to see if yours does. If you don't know where your local health department is, you can check in your phone book. The CDC also has a tool to help you locate your state's health department, and many state websites also have listings of all of their local health departments.  3) Find a Walk-in Clinic If your illness is not likely to be very severe or complicated, you may want to try a walk in clinic. These are popping up all over the country in pharmacies, drugstores, and shopping centers. They're usually staffed by nurse practitioners or physician assistants that have been trained to treat common illnesses and complaints. They're usually fairly quick and inexpensive. However, if you have serious medical issues or chronic medical problems, these are probably not your best option  STD Testing [facility] [place], STD Clinic, [address], [place], phone [phone] or [phone].  Monday - Friday, call for an appointment. [facility] of [facility] [place], STD Clinic, [place], [place], phone [phone] or [phone].  Monday - Friday, call for an appointment.  Abuse/Neglect: [facility] Child Abuse Hotline (754) 005-7550(336) 726-078-3945 [facility] Child Abuse Hotline 301-702-0700(631)329-4054 (After Hours)  Emergency Shelter:  [facility] (905) 184-6111(336) [phone]  Maternity Homes: [facility] [place] [facility] [phone](336) [phone])[phone] [facility] [phone](704) [phone]  MRSA Hotline #:   810-707-64252203532145  [facility] Resources  [facility]  [employer] [facility]. [address]                 [address]         [address] [place]  [place]                                               [place]                               [place] Phone:  [phone]                                  Phone:  [phone]                   Phone:  [phone]  [facility], [phone] [facility] Services - [facility] [phone]- [phone]       -  [facility] in [place], [address],                                  [phone], [facility] Child Abuse Hotline (931)643-6788 or 458-473-1606 (After Hours)   Behavioral Health Services  Substance Abuse Resources: Alcohol and Drug Services  (925)772-4624 [facility] [phone] [place] [phone] Floydene Flock [phone] Residential & Outpatient Substance Abuse Program  669-382-4182  Psychological Services: [facility]  [phone] [facility] Services  [phone] [facility], [phone] [place]. [address] [place], ACCESS LINE: (707)670-2063 or [phone], Entrepreneur[employer]an.co.za  Dental Assistance  If unable to pay or uninsured, contact:  [facility] or [facility]. to become qualified for the adult dental clinic.  Patients with Medicaid: [facility] [phone] [address], [phone] [address], [phone]  If unable to pay, or uninsured, contact [facility] (317)643-5826) or [facility] Department [phone] in [place], [phone] in [facility]) to become qualified for the adult dental clinic   Other Low-Cost Community Dental Services: [facility]- [address] [place], [place], [place], [address], [phone], [address]. [address], 2nd and 4th Thursday of the month at 6:30am.  10 clients each day by appointment, can sometimes see walk-in patients if someone does not show for an appointment. [facility]- [address] [place], [place], [address], [phone] [facility] [address] [place], [place], [address], [phone] [facility]  Health Department- [phone] [facility] Health Department- [phone] [facility] [phone]

## 2017-11-28 NOTE — ED Provider Notes (Signed)
[facility]-EMERGENCY DEPT Provider Note   CSN: [id number] Arrival date & time: 11/28/17  1339     History   Chief Complaint Chief Complaint  Patient presents with  . Dental Pain    HPI Suzanne [name] is a 32 y.o. female presenting for 2 days of right upper dental pain.  Patient describes pain as a constant, throbbing, severe pain that is worsened with chewing on that side.  She denies trauma or injury to the area, patient denies drainage from the area.  Patient states that she has been using ibuprofen and Tylenol for pain with minimal relief.  Patient denies recent antibiotic use, fever, nausea/vomiting, trouble breathing, facial swelling, neck swelling or pain with jaw/neck movement.  Patient states that she has an appointment with the dentist Dr. Leanord Asal tomorrow morning.  HPI  Past Medical History:  Diagnosis Date  . Bacterial infection   . Brain cyst   . Depression   . Dysplasia of cervix, low grade (CIN 1)   . ETOH abuse   . History of chicken pox   . HPV (human papilloma virus) anogenital infection   . LGSIL (low grade squamous intraepithelial dysplasia)   . UTI (urinary tract infection)   . Vaginal trichomoniasis     Patient Active Problem List   Diagnosis Date Noted  . Depression, major, recurrent, moderate (HCC) 07/08/2014  . Alcohol use disorder, severe, dependence (HCC) 07/07/2014    Past Surgical History:  Procedure Laterality Date  . SKIN GRAFT       OB History    Gravida  2   Para  1   Term      Preterm      AB      Living        SAB      TAB      Ectopic      Multiple      Live Births               Home Medications    Prior to Admission medications   Medication Sig Start Date End Date Taking? Authorizing Provider  Aspirin-Salicylamide-Caffeine (BC HEADACHE POWDER PO) Take 1 packet by mouth daily as needed (headache).    [provider]  brompheniramine-pseudoephedrine-DM 30-2-10  MG/5ML syrup Take 5 mLs by mouth 4 (four) times daily as needed. Patient not taking: Reported on 10/15/2017 03/15/17   Enid Derry, PA-C  chlorhexidine (PERIDEX) 0.12 % solution Use as directed 15 mLs in the mouth or throat 2 (two) times daily. 11/28/17   Harlene Salts A, PA-C  omeprazole (PRILOSEC) 20 MG capsule Take 1 capsule (20 mg total) by mouth daily. 10/15/17   Tilden Fossa, MD  ondansetron (ZOFRAN) 4 MG tablet Take 1 tablet (4 mg total) by mouth every 6 (six) hours. 10/15/17   Tilden Fossa, MD  penicillin v potassium (VEETID) 500 MG tablet Take 1 tablet (500 mg total) by mouth 4 (four) times daily for 7 days. 11/28/17 12/05/17  Harlene Salts A, PA-C  sucralfate (CARAFATE) 1 g tablet Take 1 tablet (1 g total) by mouth 4 (four) times daily -  with meals and at bedtime. 10/15/17   Tilden Fossa, MD    Family History Family History  Problem Relation Age of Onset  . Diabetes Mother   . Cancer Mother     Social History Social History   Tobacco Use  . Smoking status: Current Every Day Smoker    Packs/day: 1.00    Types:  Cigarettes  . Smokeless tobacco: Never Used  Substance Use Topics  . Alcohol use: Yes  . Drug use: Not Currently    Types: Marijuana     Allergies   Patient has no known allergies.   Review of Systems Review of Systems  Constitutional: Negative.  Negative for chills and fever.  HENT: Positive for dental problem. Negative for drooling, facial swelling, sore throat, trouble swallowing and voice change.   Eyes: Negative.  Negative for pain and visual disturbance.  Gastrointestinal: Negative.  Negative for nausea and vomiting.  Skin: Negative.  Negative for rash.   Physical Exam Updated Vital Signs BP 125/82 (BP Location: Left Arm)   Pulse 69   Temp 98.1 F (36.7 C) (Oral)   Resp 16   Ht 5\' 1"  (1.549 m)   Wt 52.2 kg   LMP 11/27/2017   SpO2 98%   BMI 21.73 kg/m   Physical Exam  Constitutional: She appears well-developed and  well-nourished. No distress.  HENT:  Head: Normocephalic and atraumatic.  Right Ear: Hearing, tympanic membrane, external ear and ear canal normal.  Left Ear: Hearing, tympanic membrane, external ear and ear canal normal.  Nose: Nose normal. Right sinus exhibits no maxillary sinus tenderness and no frontal sinus tenderness. Left sinus exhibits no maxillary sinus tenderness and no frontal sinus tenderness.  Mouth/Throat: Uvula is midline, oropharynx is clear and moist and mucous membranes are normal. No trismus in the jaw. Dental caries present. No dental abscesses or uvula swelling.    Patient with multiple dental caries, poor dentition overall.  Patient's pain indicated on chart, large cavity present.  No drainage present.  No gingival erythema swelling or drainage present.  No tongue protrusion, floor mouth is soft. No facial swelling.  Eyes: Pupils are equal, round, and reactive to light. Conjunctivae and EOM are normal.  No pain with extraocular movement.  Neck: Trachea normal, normal range of motion, full passive range of motion without pain and phonation normal. Neck supple. No tracheal tenderness present. No neck rigidity. No tracheal deviation present.  No swelling of the neck.  Pulmonary/Chest: Effort normal. No respiratory distress.  Abdominal: Soft. There is no tenderness. There is no rebound and no guarding.  Musculoskeletal: Normal range of motion.  Neurological: She is alert. GCS eye subscore is 4. GCS verbal subscore is 5. GCS motor subscore is 6.  Speech is clear and goal oriented, follows commands Major Cranial nerves without deficit, no facial droop Normal strength in upper and lower extremities bilaterally including dorsiflexion and plantar flexion, strong and equal grip strength Moves extremities without ataxia, coordination intact Normal gait  Skin: Skin is warm and dry.  Psychiatric: She has a normal mood and affect. Her behavior is normal.    ED Treatments /  Results  Labs (all labs ordered are listed, but only abnormal results are displayed) Labs Reviewed - No data to display  EKG None  Radiology No results found.  Procedures Procedures (including critical care time)  Medications Ordered in ED Medications  HYDROcodone-acetaminophen (NORCO/VICODIN) 5-325 MG per tablet 1 tablet (has no administration in time range)     Initial Impression / Assessment and Plan / ED Course  I have reviewed the triage vital signs and the nursing notes.  Pertinent labs & imaging results that were available during my care of the patient were reviewed by me and considered in my medical decision making (see chart for details).    Patient with dental pain. Multiple dental surface cavity noted, without  signs or symptoms of dental abscess, no swelling/erythema/tenderness of the gums.  Patient is well-appearing, afebrile, nontoxic, speaking well.  Patient able to swallow without pain.  No signs of swelling or concern for Ludwig's angina/Peritonsilar abscess/Retropharyngeal abscess or other deep tissue infections.  No sign of swelling of the neck, patient has good range of motion of the neck, no trismus.  No signs of preseptal or orbital cellulitis.  Patient prescribed penicillin VK 500 mg 4 times daily x7 days.-Denies recent antibiotic use Patient given Peridex mouth solution for comfort Patient given single dose of Norco here in emergency department for pain-family member here to drive patient home. Patient encouraged to use home therapy such as ice packs/heating pads and over-the-counter Tylenol as directed in the packaging for pain.  Patient states that she has a dental appointment with Dr. Leanord Asal tomorrow morning.  Patient encouraged to go to this appointment.  At this time there does not appear to be any evidence of an acute emergency medical condition and the patient appears stable for discharge with appropriate outpatient follow up. Diagnosis was discussed  with patient who verbalizes understanding of care plan and is agreeable to discharge. I have discussed return precautions with patient and family member who verbalizes understanding of return precautions. Patient strongly encouraged to follow-up with their PCP. All questions answered.  Note: Portions of this report may have been transcribed using voice recognition software. Every effort was made to ensure accuracy; however, inadvertent computerized transcription errors may still be present.  Final Clinical Impressions(s) / ED Diagnoses   Final diagnoses:  Pain, dental    ED Discharge Orders         Ordered    penicillin v potassium (VEETID) 500 MG tablet  4 times daily     11/28/17 1618    chlorhexidine (PERIDEX) 0.12 % solution  2 times daily     11/28/17 1618           Bill Salinas, PA-C 11/28/17 1645    Virgina Norfolk, DO 11/29/17 0123

## 2017-11-28 NOTE — ED Triage Notes (Signed)
Patient c/o right upper dental pain x 1-2 days. patient c/o pain causing her to have a headache.

## 2017-12-01 ENCOUNTER — Encounter (HOSPITAL_COMMUNITY): Payer: Self-pay | Admitting: Emergency Medicine

## 2017-12-01 ENCOUNTER — Emergency Department (HOSPITAL_COMMUNITY)
Admission: EM | Admit: 2017-12-01 | Discharge: 2017-12-01 | Disposition: A | Discharge status: Home / Self Care | Payer: Medicaid Other | Attending: Emergency Medicine | Admitting: Emergency Medicine

## 2017-12-01 ENCOUNTER — Other Ambulatory Visit: Payer: Self-pay

## 2017-12-01 DIAGNOSIS — K029 Dental caries, unspecified: Secondary | ICD-10-CM

## 2017-12-01 DIAGNOSIS — F17210 Nicotine dependence, cigarettes, uncomplicated: Secondary | ICD-10-CM | POA: Diagnosis not present

## 2017-12-01 DIAGNOSIS — K0889 Other specified disorders of teeth and supporting structures: Secondary | ICD-10-CM | POA: Diagnosis not present

## 2017-12-01 DIAGNOSIS — R51 Headache: Secondary | ICD-10-CM | POA: Insufficient documentation

## 2017-12-01 DIAGNOSIS — R519 Headache, unspecified: Secondary | ICD-10-CM

## 2017-12-01 MED ORDER — SODIUM CHLORIDE 0.9 % IV BOLUS
500.0000 mL | Freq: Once | INTRAVENOUS | Status: AC
  Administered 2017-12-01: 500 mL via INTRAVENOUS

## 2017-12-01 MED ORDER — IBUPROFEN 800 MG PO TABS: 800.0000 mg | ORAL_TABLET | Freq: Three times a day (TID) | ORAL | 0 refills | Status: DC | PRN

## 2017-12-01 MED ORDER — KETOROLAC TROMETHAMINE 15 MG/ML IJ SOLN
15.0000 mg | Freq: Once | INTRAMUSCULAR | Status: AC
  Administered 2017-12-01: 15 mg via INTRAVENOUS
  Filled 2017-12-01: qty 1

## 2017-12-01 MED ORDER — PROMETHAZINE HCL 25 MG PO TABS: 25.0000 mg | ORAL_TABLET | Freq: Three times a day (TID) | ORAL | 0 refills | Status: DC | PRN

## 2017-12-01 MED ORDER — DIPHENHYDRAMINE HCL 50 MG/ML IJ SOLN
25.0000 mg | Freq: Once | INTRAMUSCULAR | Status: AC
  Administered 2017-12-01: 25 mg via INTRAVENOUS
  Filled 2017-12-01: qty 1

## 2017-12-01 MED ORDER — METOCLOPRAMIDE HCL 5 MG/ML IJ SOLN
10.0000 mg | Freq: Once | INTRAMUSCULAR | Status: AC
  Administered 2017-12-01: 10 mg via INTRAVENOUS
  Filled 2017-12-01: qty 2

## 2017-12-01 MED ORDER — FAMOTIDINE 20 MG PO TABS: 20.0000 mg | ORAL_TABLET | Freq: Two times a day (BID) | ORAL | 0 refills | Status: DC

## 2017-12-01 NOTE — ED Provider Notes (Signed)
[facility]-EMERGENCY DEPT Provider Note   CSN: [id number] Arrival date & time: 12/01/17  0810     History   Chief Complaint Chief Complaint  Patient presents with  . Headache  . Dental Pain    HPI Suzanne [name] is a 32 y.o. female.  The history is provided by the patient. No language interpreter was used.  Headache    Dental Pain     Suzanne [name] is a 32 y.o. female who presents to the Emergency Department complaining of dental pain. She presents to the emergency department complaining of dental pain for the last 3 to 4 days. She has pain in her right upper molar was evaluated in the emergency department three days ago. She is been started on penicillin and has been taking this medication as directed. She is taking Excedrin for pain control. She is scheduled to have the tooth removed and three days. She denies any fevers but feels ill. She has associated headache on the right side. No vomiting, diarrhea, difficulty swallowing. She states that the pain has not improved since starting the antibiotics but has not worsened either. Past Medical History:  Diagnosis Date  . Bacterial infection   . Brain cyst   . Depression   . Dysplasia of cervix, low grade (CIN 1)   . ETOH abuse   . History of chicken pox   . HPV (human papilloma virus) anogenital infection   . LGSIL (low grade squamous intraepithelial dysplasia)   . UTI (urinary tract infection)   . Vaginal trichomoniasis     Patient Active Problem List   Diagnosis Date Noted  . Depression, major, recurrent, moderate (HCC) 07/08/2014  . Alcohol use disorder, severe, dependence (HCC) 07/07/2014    Past Surgical History:  Procedure Laterality Date  . SKIN GRAFT       OB History    Gravida  2   Para  1   Term      Preterm      AB      Living        SAB      TAB      Ectopic      Multiple      Live Births               Home Medications    Prior to Admission  medications   Medication Sig Start Date End Date Taking? Authorizing Provider  Aspirin-Acetaminophen-Caffeine (EXCEDRIN EXTRA STRENGTH PO) Take 1 tablet by mouth every 4 (four) hours as needed (headache).   Yes [provider]  naproxen sodium (ALEVE) 220 MG tablet Take 220 mg by mouth 2 (two) times daily as needed (headache).   Yes [provider]  penicillin v potassium (VEETID) 500 MG tablet Take 1 tablet (500 mg total) by mouth 4 (four) times daily for 7 days. 11/28/17 12/05/17 Yes Harlene Salts A, PA-C  famotidine (PEPCID) 20 MG tablet Take 1 tablet (20 mg total) by mouth 2 (two) times daily. 12/01/17   Tilden Fossa, MD  ibuprofen (ADVIL,MOTRIN) 800 MG tablet Take 1 tablet (800 mg total) by mouth every 8 (eight) hours as needed. 12/01/17   Tilden Fossa, MD  promethazine (PHENERGAN) 25 MG tablet Take 1 tablet (25 mg total) by mouth every 8 (eight) hours as needed for nausea or vomiting. 12/01/17   Tilden Fossa, MD    Family History Family History  Problem Relation Age of Onset  . Diabetes Mother   . Cancer  Mother     Social History Social History   Tobacco Use  . Smoking status: Current Every Day Smoker    Packs/day: 1.00    Types: Cigarettes  . Smokeless tobacco: Never Used  Substance Use Topics  . Alcohol use: Yes  . Drug use: Not Currently    Types: Marijuana     Allergies   Patient has no known allergies.   Review of Systems Review of Systems  Neurological: Positive for headaches.  All other systems reviewed and are negative.    Physical Exam Updated Vital Signs BP 120/78 (BP Location: Left Arm)   Pulse 98   Temp 98.4 F (36.9 C) (Oral)   Resp 16   LMP 11/27/2017   SpO2 98%   Physical Exam  Constitutional: She is oriented to person, place, and time. She appears well-developed and well-nourished.  HENT:  Head: Normocephalic and atraumatic.  Mouth/Throat:    Poor dentition. Pupils equal round and reactive, EOM I. TMs are  clear bilaterally. No significant swelling or erythema to the face. There is dental and gingival tenderness without focal fluctuance.  Cardiovascular: Normal rate and regular rhythm.  No murmur heard. Pulmonary/Chest: Effort normal and breath sounds normal. No respiratory distress.  Abdominal: Soft. There is no tenderness. There is no rebound and no guarding.  Musculoskeletal: She exhibits no edema or tenderness.  Neurological: She is alert and oriented to person, place, and time.  Skin: Skin is warm and dry.  Psychiatric: She has a normal mood and affect. Her behavior is normal.  Nursing note and vitals reviewed.    ED Treatments / Results  Labs (all labs ordered are listed, but only abnormal results are displayed) Labs Reviewed - No data to display  EKG None  Radiology No results found.  Procedures Procedures (including critical care time)  Medications Ordered in ED Medications  diphenhydrAMINE (BENADRYL) injection 25 mg (25 mg Intravenous Given 12/01/17 0926)  metoCLOPramide (REGLAN) injection 10 mg (10 mg Intravenous Given 12/01/17 0927)  ketorolac (TORADOL) 15 MG/ML injection 15 mg (15 mg Intravenous Given 12/01/17 0927)  sodium chloride 0.9 % bolus 500 mL (500 mLs Intravenous New Bag/Given 12/01/17 0926)     Initial Impression / Assessment and Plan / ED Course  I have reviewed the triage vital signs and the nursing notes.  Pertinent labs & imaging results that were available during my care of the patient were reviewed by me and considered in my medical decision making (see chart for details).     Patient here for evaluation of dental pain and headache. She is non-toxic appearing on examination. There is no trainable abscess or cellulitis on examination. Plan to treat her headache. Offered dental block and patient declines. On repeat assessment after treatment with headache cocktail her symptoms are improved. Presentation is not consistent with meningitis, dural sinus  thrombosis, serious bacterial infection. Counseled patient on home pain control with ibuprofen and Tylenol. Discussed importance of outpatient follow-up as well as return precautions.  Final Clinical Impressions(s) / ED Diagnoses   Final diagnoses:  Bad headache  Pain due to dental caries    ED Discharge Orders         Ordered    promethazine (PHENERGAN) 25 MG tablet  Every 8 hours PRN     12/01/17 1043    famotidine (PEPCID) 20 MG tablet  2 times daily     12/01/17 1043    ibuprofen (ADVIL,MOTRIN) 800 MG tablet  Every 8 hours PRN  12/01/17 1043           Tilden Fossa, MD 12/01/17 1045

## 2017-12-01 NOTE — ED Notes (Signed)
She c/o widespread muscle aches. She tells [place] she is currently on penicillin recently prescribed for "a bad tooth".

## 2017-12-01 NOTE — Discharge Instructions (Addendum)
Please follow up with your dentist on Wednesday as scheduled.  Continue your antibiotics as prescribed.  Get rechecked immediately if you develop any new or concerning symptoms.  Take tylenol according to label instructions for pain control along with the prescribed ibuprofen.  Stop taking Excedrin.

## 2017-12-01 NOTE — ED Notes (Signed)
Bed: WA13 Expected date:  Expected time:  Means of arrival:  Comments: fall 

## 2017-12-01 NOTE — ED Triage Notes (Signed)
Pt continues with right upper dental pain. Had been given PCN for dental pain and pt c/o worsening pain in tooth and severe headache today.

## 2018-05-14 ENCOUNTER — Encounter (HOSPITAL_COMMUNITY): Payer: Self-pay

## 2018-05-14 ENCOUNTER — Emergency Department (HOSPITAL_COMMUNITY)
Admission: EM | Admit: 2018-05-14 | Discharge: 2018-05-14 | Disposition: A | Discharge status: Home / Self Care | Payer: Medicaid Other | Attending: Emergency Medicine | Admitting: Emergency Medicine

## 2018-05-14 ENCOUNTER — Emergency Department (HOSPITAL_COMMUNITY): Payer: Medicaid Other

## 2018-05-14 ENCOUNTER — Other Ambulatory Visit: Payer: Self-pay

## 2018-05-14 DIAGNOSIS — J069 Acute upper respiratory infection, unspecified: Secondary | ICD-10-CM | POA: Diagnosis not present

## 2018-05-14 DIAGNOSIS — F17210 Nicotine dependence, cigarettes, uncomplicated: Secondary | ICD-10-CM | POA: Insufficient documentation

## 2018-05-14 DIAGNOSIS — Z79899 Other long term (current) drug therapy: Secondary | ICD-10-CM | POA: Insufficient documentation

## 2018-05-14 DIAGNOSIS — R05 Cough: Secondary | ICD-10-CM | POA: Diagnosis present

## 2018-05-14 DIAGNOSIS — Z720 Tobacco use: Secondary | ICD-10-CM

## 2018-05-14 NOTE — ED Provider Notes (Signed)
[facility]   CSN: [id number] Arrival date & time: 05/14/18  1928    History   Chief Complaint Chief Complaint  Patient presents with   Cough    HPI Suzanne [name] is a 33 y.o. female.     HPI   She presents for evaluation of nasal congestion, cough, nausea and vomiting, onset yesterday, worse today.  She feels like she has had chills but no documented fever.  She works at [employer].  She smokes cigarettes.  No known sick contacts, or travel outside the state.  There are no other known modifying factors.  Past Medical History:  Diagnosis Date   Bacterial infection    Brain cyst    Depression    Dysplasia of cervix, low grade (CIN 1)    ETOH abuse    History of chicken pox    HPV (human papilloma virus) anogenital infection    LGSIL (low grade squamous intraepithelial dysplasia)    UTI (urinary tract infection)    Vaginal trichomoniasis     Patient Active Problem List   Diagnosis Date Noted   Depression, major, recurrent, moderate (HCC) 07/08/2014   Alcohol use disorder, severe, dependence (HCC) 07/07/2014    Past Surgical History:  Procedure Laterality Date   SKIN GRAFT       OB History    Gravida  2   Para  1   Term      Preterm      AB      Living        SAB      TAB      Ectopic      Multiple      Live Births               Home Medications    Prior to Admission medications   Medication Sig Start Date End Date Taking? Authorizing Provider  Aspirin-Acetaminophen-Caffeine (GOODY HEADACHE PO) Take 1 tablet by mouth every 4 (four) hours as needed (headache, pain).   Yes [provider]    Family History Family History  Problem Relation Age of Onset   Diabetes Mother    Cancer Mother     Social History Social History   Tobacco Use   Smoking status: Current Every Day Smoker    Packs/day: 1.00    Types: Cigarettes   Smokeless tobacco: Never Used    Substance Use Topics   Alcohol use: Yes   Drug use: Not Currently    Types: Marijuana     Allergies   Patient has no known allergies.   Review of Systems Review of Systems  All other systems reviewed and are negative.    Physical Exam Updated Vital Signs BP 118/74    Pulse 84    Temp 98.2 F (36.8 C)    Resp 17    Ht 5\' 1"  (1.549 m)    Wt 49.9 kg    LMP 05/12/2018    SpO2 99%    BMI 20.78 kg/m   Physical Exam Vitals signs and nursing note reviewed.  Constitutional:      General: She is not in acute distress.    Appearance: Normal appearance. She is well-developed. She is not ill-appearing, toxic-appearing or diaphoretic.  HENT:     Head: Normocephalic and atraumatic.     Right Ear: External ear normal.     Left Ear: External ear normal.  Eyes:     Conjunctiva/sclera: Conjunctivae normal.  Pupils: Pupils are equal, round, and reactive to light.  Neck:     Musculoskeletal: Normal range of motion and neck supple.     Trachea: Phonation normal.  Cardiovascular:     Rate and Rhythm: Normal rate and regular rhythm.     Heart sounds: Normal heart sounds.  Pulmonary:     Effort: Pulmonary effort is normal. No respiratory distress.     Breath sounds: Normal breath sounds. No stridor. No wheezing or rhonchi.  Chest:     Chest wall: No tenderness.  Abdominal:     General: There is no distension.     Palpations: Abdomen is soft.     Tenderness: There is no abdominal tenderness.  Musculoskeletal: Normal range of motion.  Skin:    General: Skin is warm and dry.     Coloration: Skin is not jaundiced.  Neurological:     Mental Status: She is alert and oriented to person, place, and time.     Cranial Nerves: No cranial nerve deficit.     Sensory: No sensory deficit.     Motor: No abnormal muscle tone.     Coordination: Coordination normal.  Psychiatric:        Mood and Affect: Mood normal.        Behavior: Behavior normal.        Thought Content: Thought content  normal.        Judgment: Judgment normal.      ED Treatments / Results  Labs (all labs ordered are listed, but only abnormal results are displayed) Labs Reviewed - No data to display  EKG None  Radiology Dg Chest 2 View  Result Date: 05/14/2018 CLINICAL DATA:  Chest discomfort EXAM: CHEST - 2 VIEW COMPARISON:  07/28/2009 FINDINGS: The heart size and mediastinal contours are within normal limits. Both lungs are clear. The visualized skeletal structures are unremarkable. IMPRESSION: No active cardiopulmonary disease. Electronically Signed   By: Alcide Clever M.D.   On: 05/14/2018 20:09    Procedures Procedures (including critical care time)  Medications Ordered in ED Medications - No data to display   Initial Impression / Assessment and Plan / ED Course  I have reviewed the triage vital signs and the nursing notes.  Pertinent labs & imaging results that were available during my care of the patient were reviewed by me and considered in my medical decision making (see chart for details).         Patient Vitals for the past 24 hrs:  BP Temp Temp src Pulse Resp SpO2 Height Weight  05/14/18 2051 118/74 98.2 F (36.8 C) -- 84 17 99 % -- --  05/14/18 2025 -- -- -- 81 -- 97 % -- --  05/14/18 1937 123/81 97.9 F (36.6 C) Oral 100 20 98 %  (1.549 m) 49.9 kg    8:59 PM Reevaluation with update and discussion. After initial assessment and treatment, an updated evaluation reveals no change in status. Findings discussed and questions answered. Mancel Bale   Medical Decision Making: Specific symptoms with malaise, mental clear diagnostic etiology.  Possible nonspecific viral illness.  Possible early Covid-19 infection.  Patient has normal vital signs and no hypoxia, dyspnea, or significant signs and symptoms of serious illness.  No indication for further ED treatment or intervention at this time.  Plan 3-day's of self quarantine, for worsening symptoms, and if does not develop  fever, it will be safe to return to work, assuming she is continuing to do better.  Suzanne [name] was evaluated in Emergency Department on 05/14/2018 for the symptoms described in the history of present illness. She was evaluated in the context of the global COVID-19 pandemic, which necessitated consideration that the patient might be at risk for infection with the SARS-CoV-2 virus that causes COVID-19. Institutional protocols and algorithms that pertain to the evaluation of patients at risk for COVID-19 are in a state of rapid change based on information released by regulatory bodies including the CDC and federal and state organizations. These policies and algorithms were followed during the patient's care in the ED.   CRITICAL CARE-no Performed by: Mancel Bale   Nursing Notes Reviewed/ Care Coordinated Applicable Imaging Reviewed Interpretation of Laboratory Data incorporated into ED treatment  The patient appears reasonably screened and/or stabilized for discharge and I doubt any other medical condition or other [facility] requiring further screening, evaluation, or treatment in the ED at this time prior to discharge.  Plan: Home Medications-routine OTC medications; Home Treatments-rest, fluids; return here if the recommended treatment, does not improve the symptoms; Recommended follow up-PCP, PRN     Final Clinical Impressions(s) / ED Diagnoses   Final diagnoses:  Viral upper respiratory tract infection  Tobacco abuse    ED Discharge Orders    None       Mancel Bale, MD 05/14/18 2059

## 2018-05-14 NOTE — Discharge Instructions (Signed)
It is not clear what is causing your symptoms.  You are advised to stay home, and self quarantine yourself for 72 hours.  If you do not develop a fever within this time, worsening symptoms or have other concerns, he will be safe to return to work.  For cough, use Robitussin-DM.  Try to stop smoking because I will let you heal faster.  Return here if needed, for problems.

## 2018-05-14 NOTE — ED Triage Notes (Signed)
Pt reports  runny nose, chest discomfort, and dizziness starting this morning. Worsening cough over this week. Endorses chills. A&Ox4. No known sick contacts.

## 2018-08-09 ENCOUNTER — Emergency Department (HOSPITAL_COMMUNITY)
Admission: EM | Admit: 2018-08-09 | Discharge: 2018-08-09 | Disposition: A | Discharge status: Home / Self Care | Payer: Medicaid Other | Attending: Emergency Medicine | Admitting: Emergency Medicine

## 2018-08-09 ENCOUNTER — Other Ambulatory Visit: Payer: Self-pay

## 2018-08-09 DIAGNOSIS — R569 Unspecified convulsions: Secondary | ICD-10-CM

## 2018-08-09 DIAGNOSIS — F1010 Alcohol abuse, uncomplicated: Secondary | ICD-10-CM | POA: Insufficient documentation

## 2018-08-09 DIAGNOSIS — F17210 Nicotine dependence, cigarettes, uncomplicated: Secondary | ICD-10-CM | POA: Diagnosis not present

## 2018-08-09 LAB — CBC WITH DIFFERENTIAL/PLATELET
Abs Immature Granulocytes: 0.01 10*3/uL (ref 0.00–0.07)
Basophils Absolute: 0 10*3/uL (ref 0.0–0.1)
Basophils Relative: 0 %
Eosinophils Absolute: 0 10*3/uL (ref 0.0–0.5)
Eosinophils Relative: 1 %
HCT: 34.8 % — ABNORMAL LOW (ref 36.0–46.0)
Hemoglobin: 12 g/dL (ref 12.0–15.0)
Immature Granulocytes: 0 %
Lymphocytes Relative: 33 %
Lymphs Abs: 1.2 10*3/uL (ref 0.7–4.0)
MCH: 37.2 pg — ABNORMAL HIGH (ref 26.0–34.0)
MCHC: 34.5 g/dL (ref 30.0–36.0)
MCV: 107.7 fL — ABNORMAL HIGH (ref 80.0–100.0)
Monocytes Absolute: 0.1 10*3/uL (ref 0.1–1.0)
Monocytes Relative: 3 %
Neutro Abs: 2.3 10*3/uL (ref 1.7–7.7)
Neutrophils Relative %: 63 %
Platelets: UNDETERMINED 10*3/uL (ref 150–400)
RBC: 3.23 MIL/uL — ABNORMAL LOW (ref 3.87–5.11)
RDW: 15.3 % (ref 11.5–15.5)
WBC: 3.6 10*3/uL — ABNORMAL LOW (ref 4.0–10.5)
nRBC: 0 % (ref 0.0–0.2)

## 2018-08-09 LAB — COMPREHENSIVE METABOLIC PANEL
ALT: 18 U/L (ref 0–44)
AST: 47 U/L — ABNORMAL HIGH (ref 15–41)
Albumin: 3.6 g/dL (ref 3.5–5.0)
Alkaline Phosphatase: 62 U/L (ref 38–126)
Anion gap: 12 (ref 5–15)
BUN: 11 mg/dL (ref 6–20)
CO2: 21 mmol/L — ABNORMAL LOW (ref 22–32)
Calcium: 8.8 mg/dL — ABNORMAL LOW (ref 8.9–10.3)
Chloride: 106 mmol/L (ref 98–111)
Creatinine, Ser: 0.67 mg/dL (ref 0.44–1.00)
GFR calc Af Amer: >60 mL/min (ref >60)
GFR calc non Af Amer: >60 mL/min (ref >60)
Glucose, Bld: 92 mg/dL (ref 70–99)
Potassium: 3.3 mmol/L — ABNORMAL LOW (ref 3.5–5.1)
Sodium: 139 mmol/L (ref 135–145)
Total Bilirubin: 0.5 mg/dL (ref 0.3–1.2)
Total Protein: 5.9 g/dL — ABNORMAL LOW (ref 6.5–8.1)

## 2018-08-09 LAB — ETHANOL: Alcohol, Ethyl (B): <10 mg/dL (ref <10)

## 2018-08-09 LAB — I-STAT BETA HCG BLOOD, ED (MC, WL, AP ONLY): I-stat hCG, quantitative: <5 m[IU]/mL (ref <5)

## 2018-08-09 MED ORDER — SODIUM CHLORIDE 0.9 % IV BOLUS
1000.0000 mL | Freq: Once | INTRAVENOUS | Status: AC
  Administered 2018-08-09: 1000 mL via INTRAVENOUS

## 2018-08-09 NOTE — Discharge Instructions (Addendum)
Per [place] DMV statutes, patients with seizures are not allowed to drive until  they have been seizure-free for six months. Use caution when using heavy equipment or power tools. Avoid working on ladders or at heights. Take showers instead of baths. Ensure the water temperature is not too high on the home water heater. Do not go swimming alone. When caring for infants or small children, sit down when holding, feeding, or changing them to minimize risk of injury to the child in the event you have a seizure.   Also, Maintain good sleep hygiene. Avoid alcohol.   --> Call 911 and bring the patient back to the ED if:                   -  The seizure lasts longer than 5 minutes.                  -  The patient doesn't awaken shortly after the seizure             -  The patient has new problems such as difficulty seeing, speaking or moving             -  The patient was injured during the seizure             - The patient has a temperature over 102 F (39C)             - The patient vomited and now is having trouble breathing   

## 2018-08-09 NOTE — ED Notes (Signed)
All appropriate discharge materials reviewed at length with patient. Time for questions provided. Pt has no other questions at this time and verbalizes understanding of all provided materials.  

## 2018-08-09 NOTE — ED Provider Notes (Signed)
[facility] Provider Note   CSN: [id number] Arrival date & time: 08/09/18  1424    History   Chief Complaint No chief complaint on file.   HPI Suzanne [name] is a 33 y.o. female past medical history sniffing for alcohol abuse, depression presents emergency department today with chief complaint of seizure-like activity.  Onset was acute happening just prior to arrival.  Patient states she [facility] [facility] the next thing she remembers she was waking up in the ambulance.  Her friend witnessed the seizure, he described it as she started to lean on the counter and close her eyes so he carefully lowered her to the ground.  She had 3 to 4 minutes of her like activity described as arms retracted, eyes rolled back in her head, no tongue trauma, no incontinence.  On EMS arrival patient was alert and oriented walking around the parking lot.  Patient admits to drinking a pint of liquor daily for the last several months.  Patient states she has a history of seizures, 1 happened a year ago.  She did not follow-up with neurology and was not started on medications.  History provided by patient with additional history obtained from chart review.     Past Medical History:  Diagnosis Date   Bacterial infection    Brain cyst    Depression    Dysplasia of cervix, low grade (CIN 1)    ETOH abuse    History of chicken pox    HPV (human papilloma virus) anogenital infection    LGSIL (low grade squamous intraepithelial dysplasia)    UTI (urinary tract infection)    Vaginal trichomoniasis     Patient Active Problem List   Diagnosis Date Noted   Depression, major, recurrent, moderate (HCC) 07/08/2014   Alcohol use disorder, severe, dependence (HCC) 07/07/2014    Past Surgical History:  Procedure Laterality Date   SKIN GRAFT       OB History    Gravida  2   Para  1   Term      Preterm      AB      Living        SAB      TAB      Ectopic      Multiple      Live Births               Home Medications    Prior to Admission medications   Medication Sig Start Date End Date Taking? Authorizing Provider  Aspirin-Acetaminophen-Caffeine (GOODY HEADACHE PO) Take 1 packet by mouth as needed (for pain or headaches).    Yes [provider]    Family History Family History  Problem Relation Age of Onset   Diabetes Mother    Cancer Mother     Social History Social History   Tobacco Use   Smoking status: Current Every Day Smoker    Packs/day: 1.00    Types: Cigarettes   Smokeless tobacco: Never Used  Substance Use Topics   Alcohol use: Yes   Drug use: Not Currently    Types: Marijuana     Allergies   Patient has no known allergies.   Review of Systems Review of Systems  Constitutional: Negative for chills and fever.  HENT: Negative for congestion, ear discharge, ear pain, sinus pressure, sinus pain and sore throat.   Eyes: Negative for pain and redness.  Respiratory: Negative for cough  and shortness of breath.   Cardiovascular: Negative for chest pain.  Gastrointestinal: Negative for abdominal pain, constipation, diarrhea, nausea and vomiting.  Genitourinary: Negative for dysuria and hematuria.  Musculoskeletal: Negative for back pain and neck pain.  Skin: Negative for wound.  Neurological: Positive for seizures. Negative for weakness, numbness and headaches.     Physical Exam Updated Vital Signs BP 115/82 (BP Location: Right Arm)    Pulse 75    Temp 98.2 F (36.8 C)    Resp 20    Ht 5\' 1"  (1.549 m)    Wt 49.9 kg    SpO2 99%    BMI 20.78 kg/m   Physical Exam Vitals signs and nursing note reviewed.  Constitutional:      General: She is not in acute distress.    Appearance: She is not ill-appearing.  HENT:     Head: Normocephalic and atraumatic.     Comments: No tenderness to palpation of skull. No deformities or crepitus noted. No open wounds,  abrasions or lacerations.    Right Ear: Tympanic membrane and external ear normal.     Left Ear: Tympanic membrane and external ear normal.     Nose: Nose normal.     Mouth/Throat:     Mouth: Mucous membranes are moist.     Pharynx: Oropharynx is clear.     Comments: No wound or laceration to tongue noted. Eyes:     General: No scleral icterus.       Right eye: No discharge.        Left eye: No discharge.     Extraocular Movements: Extraocular movements intact.     Conjunctiva/sclera: Conjunctivae normal.     Pupils: Pupils are equal, round, and reactive to light.  Neck:     Musculoskeletal: Normal range of motion.     Vascular: No JVD.  Cardiovascular:     Rate and Rhythm: Normal rate and regular rhythm.     Pulses: Normal pulses.          Radial pulses are 2+ on the right side and 2+ on the left side.     Heart sounds: Normal heart sounds.  Pulmonary:     Comments: Lungs clear to auscultation in all fields. Symmetric chest rise. No wheezing, rales, or rhonchi. Abdominal:     Comments: Abdomen is soft, non-distended, and non-tender in all quadrants. No rigidity, no guarding. No peritoneal signs.  Musculoskeletal: Normal range of motion.  Skin:    General: Skin is warm and dry.     Capillary Refill: Capillary refill takes less than 2 seconds.  Neurological:     Mental Status: She is oriented to person, place, and time.     GCS: GCS eye subscore is 4. GCS verbal subscore is 5. GCS motor subscore is 6.     Comments: Mental Status:  Alert, oriented, thought content appropriate, able to give a coherent history. Speech fluent without evidence of aphasia. Able to follow 2 step commands without difficulty.  Cranial Nerves:  II:  Peripheral visual fields grossly normal, pupils equal, round, reactive to light III,IV, VI: ptosis not present, extra-ocular motions intact bilaterally  V,VII: smile symmetric, facial light touch sensation equal VIII: hearing grossly normal to voice  X:  uvula elevates symmetrically  XI: bilateral shoulder shrug symmetric and strong XII: midline tongue extension without fassiculations Motor:  Normal tone. 5/5 in upper and lower extremities bilaterally including strong and equal grip strength and dorsiflexion/plantar flexion Sensory: Pinprick and light touch  normal in all extremities.  Deep Tendon Reflexes: 2+ and symmetric in the biceps and patella Cerebellar: normal finger-to-nose with bilateral upper extremities Gait: normal gait and balance CV: distal pulses palpable throughout   Psychiatric:        Behavior: Behavior normal.      ED Treatments / Results  Labs (all labs ordered are listed, but only abnormal results are displayed) Labs Reviewed  CBC WITH DIFFERENTIAL/PLATELET - Abnormal; Notable for the following components:      Result Value   WBC 3.6 (*)    RBC 3.23 (*)    HCT 34.8 (*)    MCV 107.7 (*)    MCH 37.2 (*)    All other components within normal limits  COMPREHENSIVE METABOLIC PANEL - Abnormal; Notable for the following components:   Potassium 3.3 (*)    CO2 21 (*)    Calcium 8.8 (*)    Total Protein 5.9 (*)    AST 47 (*)    All other components within normal limits  ETHANOL  I-STAT BETA HCG BLOOD, ED ([facility], [facility], [facility] ONLY)    EKG EKG Interpretation  Date/Time:  Saturday August 09 2018 14:29:38 EDT Ventricular Rate:  86 PR Interval:    QRS Duration: 78 QT Interval:  393 QTC Calculation: 471 R Axis:   77 Text Interpretation:  Sinus rhythm No significant change since last tracing Confirmed by Richardean CanalYao, David H [phone] on 08/09/2018 3:21:46 PM   Radiology No results found.  Procedures Procedures (including critical care time)  Medications Ordered in ED Medications  sodium chloride 0.9 % bolus 1,000 mL (0 mLs Intravenous Stopped 08/09/18 1754)     Initial Impression / Assessment and Plan / ED Course  I have reviewed the triage vital signs and the nursing notes.  Pertinent labs & imaging results that were  available during my care of the patient were reviewed by me and considered in my medical decision making (see chart for details).  Patient with possible seizure.  On arrival she is overall well-appearing, no acute distress.  Vitals are reassuring, normotensive, no tachycardia.  Neuro exam without focal deficit.  No evidence of tongue biting, incontinence.  Plan to obtain basic labs, IV fluids and observe.  Possible alcohol withdrawal seizure however on exam patient has signs of withdrawal.  CIWA score of 1.  At this time will hold off on CT head, if any mental status changes will move forward with imaging.  EKG shows normal sinus rhythm.  BMP and CBC overall unremarkable.  Alcohol negative. Pregnancy negative.  On reassessment patient is requesting discharge.  She states she feels fine.  Vital signs have been stable and normal throughout ED stay.  She is still without signs of alcohol withdrawal.  I ambulated patient and she did so with steady gait.   Seizure precautions discussed at length with patient.  Patient is comfortable with above plan and is stable for discharge at this time. All questions were answered prior to disposition.  Strict return precautions for returning to the ED were discussed. Encouraged follow up with PCP. Provided patient with a list of clinic resources to use as she does not have a PCP. Instructed to call them today to arrange follow-up in the next 24-48 hours. Findings and plan of care discussed with supervising physician Dr. Silverio LayYao.  This note was prepared using Dragon voice recognition software and may include unintentional dictation errors due to the inherent limitations of voice recognition software.    Final Clinical Impressions(s) /  ED Diagnoses   Final diagnoses:  Seizure-like activity [facility])    ED Discharge Orders    None       Flint Melter 08/10/18 1545    Drenda Freeze, MD 08/10/18 2245

## 2018-08-09 NOTE — ED Triage Notes (Signed)
Per EMS: pt was in the store with a friend, began to have seizure like activity, friend eased pt to the ground. Pt's fiend told EMS the seizure lasted 3-4 minutes with muscle tension, arms retracted, and eyes rolled back in head. Afterwards pt was alert but not clearly oriented. Upon EMS arrival pt walking around outside of store. EMS placed pt on 3L 02. By the time pt arrived at the hospital A/O x4 and remembers most of what happened.  Pt states she has a HX of seizures with the last being a year ago. No tongue trauma noted. No incontinence.   EMS vitals:  BP 104/74 HR 106 Sp02 100% CBG 129

## 2018-08-09 NOTE — ED Notes (Signed)
Got patient on the monitor did ekg shown to Dr Wilson Singer patient is resting with call bell in reach

## 2018-08-18 ENCOUNTER — Other Ambulatory Visit: Payer: Self-pay

## 2018-08-18 ENCOUNTER — Encounter (HOSPITAL_COMMUNITY): Payer: Self-pay | Admitting: Emergency Medicine

## 2018-08-18 ENCOUNTER — Emergency Department (HOSPITAL_COMMUNITY)
Admission: EM | Admit: 2018-08-18 | Discharge: 2018-08-18 | Disposition: A | Discharge status: Home / Self Care | Payer: Medicaid Other | Attending: Emergency Medicine | Admitting: Emergency Medicine

## 2018-08-18 ENCOUNTER — Emergency Department (HOSPITAL_COMMUNITY): Payer: Medicaid Other

## 2018-08-18 DIAGNOSIS — F10230 Alcohol dependence with withdrawal, uncomplicated: Secondary | ICD-10-CM | POA: Diagnosis not present

## 2018-08-18 DIAGNOSIS — R197 Diarrhea, unspecified: Secondary | ICD-10-CM | POA: Insufficient documentation

## 2018-08-18 DIAGNOSIS — R112 Nausea with vomiting, unspecified: Secondary | ICD-10-CM | POA: Diagnosis present

## 2018-08-18 DIAGNOSIS — E860 Dehydration: Secondary | ICD-10-CM | POA: Insufficient documentation

## 2018-08-18 DIAGNOSIS — F10930 Alcohol use, unspecified with withdrawal, uncomplicated: Secondary | ICD-10-CM

## 2018-08-18 DIAGNOSIS — F17210 Nicotine dependence, cigarettes, uncomplicated: Secondary | ICD-10-CM | POA: Diagnosis not present

## 2018-08-18 LAB — TROPONIN I (HIGH SENSITIVITY)
Troponin I (High Sensitivity): 23 ng/L — ABNORMAL HIGH (ref <18)
Troponin I (High Sensitivity): 29 ng/L — ABNORMAL HIGH (ref <18)

## 2018-08-18 LAB — CBC WITH DIFFERENTIAL/PLATELET
Abs Immature Granulocytes: 0.01 10*3/uL (ref 0.00–0.07)
Basophils Absolute: 0 10*3/uL (ref 0.0–0.1)
Basophils Relative: 0 %
Eosinophils Absolute: 0.1 10*3/uL (ref 0.0–0.5)
Eosinophils Relative: 1 %
HCT: 39.4 % (ref 36.0–46.0)
Hemoglobin: 13.3 g/dL (ref 12.0–15.0)
Immature Granulocytes: 0 %
Lymphocytes Relative: 18 %
Lymphs Abs: 1.1 10*3/uL (ref 0.7–4.0)
MCH: 36.3 pg — ABNORMAL HIGH (ref 26.0–34.0)
MCHC: 33.8 g/dL (ref 30.0–36.0)
MCV: 107.7 fL — ABNORMAL HIGH (ref 80.0–100.0)
Monocytes Absolute: 0.3 10*3/uL (ref 0.1–1.0)
Monocytes Relative: 4 %
Neutro Abs: 4.6 10*3/uL (ref 1.7–7.7)
Neutrophils Relative %: 77 %
Platelets: 345 10*3/uL (ref 150–400)
RBC: 3.66 MIL/uL — ABNORMAL LOW (ref 3.87–5.11)
RDW: 14.8 % (ref 11.5–15.5)
WBC: 6 10*3/uL (ref 4.0–10.5)
nRBC: 0 % (ref 0.0–0.2)

## 2018-08-18 LAB — URINALYSIS, ROUTINE W REFLEX MICROSCOPIC
Bilirubin Urine: NEGATIVE
Glucose, UA: NEGATIVE mg/dL
Ketones, ur: 20 mg/dL — AB
Leukocytes,Ua: NEGATIVE
Nitrite: NEGATIVE
Protein, ur: NEGATIVE mg/dL
Specific Gravity, Urine: 1.01 (ref 1.005–1.030)
pH: 7 (ref 5.0–8.0)

## 2018-08-18 LAB — COMPREHENSIVE METABOLIC PANEL
ALT: 18 U/L (ref 0–44)
AST: 49 U/L — ABNORMAL HIGH (ref 15–41)
Albumin: 4.2 g/dL (ref 3.5–5.0)
Alkaline Phosphatase: 68 U/L (ref 38–126)
Anion gap: 10 (ref 5–15)
BUN: 9 mg/dL (ref 6–20)
CO2: 27 mmol/L (ref 22–32)
Calcium: 8.6 mg/dL — ABNORMAL LOW (ref 8.9–10.3)
Chloride: 102 mmol/L (ref 98–111)
Creatinine, Ser: 0.65 mg/dL (ref 0.44–1.00)
GFR calc Af Amer: >60 mL/min (ref >60)
GFR calc non Af Amer: >60 mL/min (ref >60)
Glucose, Bld: 89 mg/dL (ref 70–99)
Potassium: 4.4 mmol/L (ref 3.5–5.1)
Sodium: 139 mmol/L (ref 135–145)
Total Bilirubin: 1 mg/dL (ref 0.3–1.2)
Total Protein: 6.9 g/dL (ref 6.5–8.1)

## 2018-08-18 LAB — I-STAT BETA HCG BLOOD, ED (MC, WL, AP ONLY): I-stat hCG, quantitative: <5 m[IU]/mL (ref <5)

## 2018-08-18 LAB — LIPASE, BLOOD: Lipase: 26 U/L (ref 11–51)

## 2018-08-18 MED ORDER — LORAZEPAM 2 MG/ML IJ SOLN
1.0000 mg | Freq: Once | INTRAMUSCULAR | Status: AC
  Administered 2018-08-18: 13:00:00 1 mg via INTRAVENOUS
  Filled 2018-08-18: qty 1

## 2018-08-18 MED ORDER — FOLIC ACID 1 MG PO TABS: 1.0000 mg | ORAL_TABLET | Freq: Every day | ORAL | 0 refills | Status: AC

## 2018-08-18 MED ORDER — CHLORDIAZEPOXIDE HCL 25 MG PO CAPS: ORAL_CAPSULE | ORAL | 0 refills | Status: DC

## 2018-08-18 MED ORDER — KETOROLAC TROMETHAMINE 30 MG/ML IJ SOLN
30.0000 mg | Freq: Once | INTRAMUSCULAR | Status: AC
  Administered 2018-08-18: 30 mg via INTRAVENOUS
  Filled 2018-08-18: qty 1

## 2018-08-18 MED ORDER — VITAMIN B-1 100 MG PO TABS: 100.0000 mg | ORAL_TABLET | Freq: Every day | ORAL | 0 refills | Status: AC

## 2018-08-18 MED ORDER — PROMETHAZINE HCL 25 MG/ML IJ SOLN
12.5000 mg | Freq: Once | INTRAMUSCULAR | Status: AC
  Administered 2018-08-18: 12.5 mg via INTRAVENOUS
  Filled 2018-08-18: qty 1

## 2018-08-18 MED ORDER — SODIUM CHLORIDE 0.9 % IV BOLUS
1000.0000 mL | Freq: Once | INTRAVENOUS | Status: AC
  Administered 2018-08-18: 1000 mL via INTRAVENOUS

## 2018-08-18 NOTE — ED Triage Notes (Signed)
Pt adds had little diarrhea and hasnt been able to eat and drink past couple days due to n/v. C/o feeling fatigued, body aches and headache

## 2018-08-18 NOTE — ED Triage Notes (Signed)
Per [facility] pt from home for near syncope and vomiting. Pt reports hasn't drank ETOH in couple days and normally drinks daily.  Initial 88/48, 130HR given IV 20g in right forearm NS 573ml Zofran 4mg  given in route. CBG 162. 97.4 temp.

## 2018-08-18 NOTE — ED Provider Notes (Signed)
5:02 PM-seen by previous provider, had evaluation consistent with nonspecific enteritis.  Treated with IV fluids for suspected volume depletion with orthostasis.  Clinical Course as of Aug 17 1829  Mon Aug 18, 2018  1830 Abnormal, elevated, 6 points higher than initial.  Troponin I (High Sensitivity)(!) [EW]  1830 Normal  Lipase, blood [EW]  1830 Normal except MCV elevated  CBC with Differential(!) [EW]  1830 Normal  I-Stat beta hCG blood, ED [EW]  1831 Normal except calcium low, AST high  Comprehensive metabolic panel(!) [EW]  6967 Normal except presence of hemoglobin, ketones, rare bacteria and squamous epithelial cells  Urinalysis, Routine w reflex microscopic(!) [EW]  1831 Normal  DG Chest Portable 1 View [EW]    Clinical Course User Index [EW] Daleen Bo, MD     EKG Interpretation  Date/Time:  Monday August 18 2018 14:16:04 EDT Ventricular Rate:  88 PR Interval:    QRS Duration: 84 QT Interval:  413 QTC Calculation: 500 R Axis:   68 Text Interpretation:  Sinus rhythm No STEMI  Confirmed by Nanda Quinton [phone]) on 08/18/2018 2:19:37 PM       Patient Vitals for the past 24 hrs:  BP Temp Temp src Pulse Resp SpO2 Height  08/18/18 1730 (!) 160/105 - - 78 (!) 21 100 % -  08/18/18 1700 (!) 152/102 - - 79 (!) 26 100 % -  08/18/18 1600 (!) 142/111 - - 84 (!) 21 100 % -  08/18/18 1500 (!) 145/106 - - (!) 119 17 100 % -  08/18/18 1400 (!) 154/97 - - - (!) 28 - -  08/18/18 1230 (!) 159/102 - - 65 17 100 % -  08/18/18 1210 (!) 170/92 - - 74 - - -  08/18/18 1122 - - - - - - 5\' 1"  (1.549 m)  08/18/18 1120 (!) 145/97 97.9 F (36.6 C) Oral 84 17 100 % -    6:31 PM Reevaluation with update and discussion. After initial assessment and treatment, an updated evaluation reveals at this time she is tolerating liquids and states her dizziness that she had earlier prior to IV fluid placement, has resolved.  She affirms that she wants to stop drinking alcohol.  Also she denies prior  cardiovascular risks.  Findings discussed and questions answered. Daleen Bo   Medical Decision Making: She presents for evaluation of nonspecific enteritis, and has improvement after treated for volume depletion.  She is able to tolerate liquids and walk without symptoms.  Troponin testing was performed, apparently because of tachycardia.  It is minimally elevated.  Delta troponin is very minimally abnormal.  If 1 uses the very conservative measure of 5 units.  The patient does not have any cardiovascular risks that she knows of.  I think that it is highly unlikely that this represents myocardial ischemia/infarct.  CRITICAL CARE-no Performed by: Daleen Bo   Nursing Notes Reviewed/ Care Coordinated Applicable Imaging Reviewed Interpretation of Laboratory Data incorporated into ED treatment  The patient appears reasonably screened and/or stabilized for discharge and I doubt any other medical condition or other [facility] requiring further screening, evaluation, or treatment in the ED at this time prior to discharge.  Plan: Home Medications-OTC as needed; Home Treatments-avoid alcohol; return here if the recommended treatment, does not improve the symptoms; Recommended follow up-PCP, PRN      Daleen Bo, MD 08/18/18 1840

## 2018-08-18 NOTE — ED Notes (Signed)
Pt patient ambulated to restroom with steady gait. Pt able to tolerate sprite without any problems.

## 2018-08-18 NOTE — ED Provider Notes (Signed)
Emergency Department Provider Note   I have reviewed the triage vital signs and the nursing notes.   HISTORY  Chief Complaint Emesis, Diarrhea, Fatigue, and Headache   HPI Suzanne [name] is a 33 y.o. female with PMH of EtOH abuse presents to the emergency department for evaluation of nausea, vomiting, lightheadedness with standing.  Patient states that she typically drinks daily and does report a past history of seizures thought to be related to alcohol withdrawal.  She is not on AEDs.  She states that 2 days ago she developed some mild diarrhea along with nausea and vomiting.  She has been feeling body aches, headache, generalized weakness.  This morning she reports nearly passing out with standing and called EMS.  EMS arrived to find the patient hypotensive with systolic blood pressure of 88 and tachycardia to 130.  They gave half liter bolus along with Zofran and transported to the emergency department.  The patient denies any chest pain, heart palpitations, shortness of breath symptoms.  No abdominal pain.  No blood in the emesis or stool.  No recent sick contacts.  Past Medical History:  Diagnosis Date  . Bacterial infection   . Brain cyst   . Depression   . Dysplasia of cervix, low grade (CIN 1)   . ETOH abuse   . History of chicken pox   . HPV (human papilloma virus) anogenital infection   . LGSIL (low grade squamous intraepithelial dysplasia)   . UTI (urinary tract infection)   . Vaginal trichomoniasis     Patient Active Problem List   Diagnosis Date Noted  . Depression, major, recurrent, moderate (HCC) 07/08/2014  . Alcohol use disorder, severe, dependence (HCC) 07/07/2014    Past Surgical History:  Procedure Laterality Date  . SKIN GRAFT      Allergies Patient has no known allergies.  Family History  Problem Relation Age of Onset  . Diabetes Mother   . Cancer Mother     Social History Social History   Tobacco Use  . Smoking status: Current Every  Day Smoker    Packs/day: 1.00    Types: Cigarettes  . Smokeless tobacco: Never Used  Substance Use Topics  . Alcohol use: Yes  . Drug use: Not Currently    Types: Marijuana    Review of Systems  Constitutional: No fever/chills. Positive generalized weakness.  Eyes: No visual changes. ENT: No sore throat. Cardiovascular: Denies chest pain. Respiratory: Denies shortness of breath. Gastrointestinal: No abdominal pain. Positive nausea, vomiting, and diarrhea.  No constipation. Genitourinary: Negative for dysuria. Musculoskeletal: Negative for back pain. Positive body aches.  Skin: Negative for rash. Neurological: Negative for focal weakness or numbness. Positive HA.   10-point ROS otherwise negative.  ____________________________________________   PHYSICAL EXAM:  VITAL SIGNS: ED Triage Vitals  Enc Vitals Group     BP 08/18/18 1120 (!) 145/97     Pulse Rate 08/18/18 1120 84     Resp 08/18/18 1120 17     Temp 08/18/18 1120 97.9 F (36.6 C)     Temp Source 08/18/18 1120 Oral     SpO2 08/18/18 1120 100 %     Weight --      Height 08/18/18 1122 5\' 1"  (1.549 m)   Constitutional: Alert and oriented. Well appearing and in no acute distress. Eyes: Conjunctivae are normal. PERRL. Head: Atraumatic. Nose: No congestion/rhinnorhea. Mouth/Throat: Mucous membranes are slightly dry.  Neck: No stridor.  No meningeal signs.   Cardiovascular: Normal rate, regular rhythm.  Good peripheral circulation. Grossly normal heart sounds.   Respiratory: Normal respiratory effort.  No retractions. Lungs CTAB. Gastrointestinal: Soft and nontender. No distention.  Musculoskeletal: No lower extremity tenderness nor edema. No gross deformities of extremities. Neurologic:  Normal speech and language. No gross focal neurologic deficits are appreciated.  Skin:  Skin is warm, dry and intact. No rash noted.  ____________________________________________   LABS (all labs ordered are listed, but only  abnormal results are displayed)  Labs Reviewed  COMPREHENSIVE METABOLIC PANEL - Abnormal; Notable for the following components:      Result Value   Calcium 8.6 (*)    AST 49 (*)    All other components within normal limits  CBC WITH DIFFERENTIAL/PLATELET - Abnormal; Notable for the following components:   RBC 3.66 (*)    MCV 107.7 (*)    MCH 36.3 (*)    All other components within normal limits  URINALYSIS, ROUTINE W REFLEX MICROSCOPIC - Abnormal; Notable for the following components:   APPearance HAZY (*)    Hgb urine dipstick MODERATE (*)    Ketones, ur 20 (*)    Bacteria, UA RARE (*)    All other components within normal limits  TROPONIN I (HIGH SENSITIVITY) - Abnormal; Notable for the following components:   Troponin I (High Sensitivity) 23 (*)    All other components within normal limits  LIPASE, BLOOD  I-STAT BETA HCG BLOOD, ED ([facility], [facility], [facility] ONLY)  TROPONIN I (HIGH SENSITIVITY)   ____________________________________________  EKG   EKG Interpretation  Date/Time:  Monday August 18 2018 14:16:04 EDT Ventricular Rate:  88 PR Interval:    QRS Duration: 84 QT Interval:  413 QTC Calculation: 500 R Axis:   68 Text Interpretation:  Sinus rhythm No STEMI  Confirmed by Nanda Quinton [phone]) on 08/18/2018 2:19:37 PM       ____________________________________________  RADIOLOGY  Dg Chest Portable 1 View  Result Date: 08/18/2018 CLINICAL DATA:  Syncope. EXAM: PORTABLE CHEST 1 VIEW COMPARISON:  05/14/2018. FINDINGS: Mediastinum and hilar structures normal. Lungs are clear. No pleural effusion or pneumothorax. Heart size normal. Degenerative change scratched it no acute bony abnormality. IMPRESSION: No acute cardiopulmonary disease. Electronically Signed   By: Marcello Moores  Register   On: 08/18/2018 12:31    ____________________________________________   PROCEDURES  Procedure(s) performed:   Procedures  None  ____________________________________________   INITIAL  IMPRESSION / ASSESSMENT AND PLAN / ED COURSE  Pertinent labs & imaging results that were available during my care of the patient were reviewed by me and considered in my medical decision making (see chart for details).   Patient presents to the emergency department for evaluation of nausea, vomiting, diarrhea with associated near syncope type symptoms.  She has been abstinent from alcohol for the past 2 days. CIWA here is 10 with mostly subjective symptoms.  She does have hypertension now in the emergency department but no tachycardia.  No fever to suspect infectious etiology of symptoms. Plan for labs, IVF, Ativan, and Toradol for HA. No findings on exam to suspect meningitis or SAH. No head imaging at this time. Abdomen is soft and non-tender.   02:40 PM  Orthostatic vitals are positive. Repeat troponin pending. Doubt primary ACS/PE at this time. Patient feeling subjectively better at rest but lightheaded with standing. Giving additional IVF and repeat orthostatics. Plan for discharge if improved. Librium taper called in and local detox resources provided.   Care transferred to Dr. Eulis Foster pending repeat troponin, IVF, and repeat orthostatic vitals. Anticipate  discharge. Patient feeling much improved after 1st liter of IVF.  ____________________________________________  FINAL CLINICAL IMPRESSION(S) / ED DIAGNOSES  Final diagnoses:  Dehydration  Nausea vomiting and diarrhea  Alcohol withdrawal syndrome without complication (HCC)     MEDICATIONS GIVEN DURING THIS VISIT:  Medications  sodium chloride 0.9 % bolus 1,000 mL (0 mLs Intravenous Stopped 08/18/18 1305)  promethazine (PHENERGAN) injection 12.5 mg (12.5 mg Intravenous Given 08/18/18 1304)  LORazepam (ATIVAN) injection 1 mg (1 mg Intravenous Given 08/18/18 1320)  ketorolac (TORADOL) 30 MG/ML injection 30 mg (30 mg Intravenous Given 08/18/18 1319)     NEW OUTPATIENT MEDICATIONS STARTED DURING THIS VISIT:  New Prescriptions    CHLORDIAZEPOXIDE (LIBRIUM) 25 MG CAPSULE    50mg  PO TID x 1D, then 25-50mg  PO BID X 1D, then 25-50mg  PO QD X 1D   FOLIC ACID (FOLVITE) 1 MG TABLET    Take 1 tablet (1 mg total) by mouth daily.   THIAMINE (VITAMIN B-1) 100 MG TABLET    Take 1 tablet (100 mg total) by mouth daily.    Note:  This document was prepared using Dragon voice recognition software and may include unintentional dictation errors.  Suzanne BeneJoshua Janifer Gieselman, MD Emergency Medicine    Argil Mahl, Arlyss RepressJoshua G, MD 08/19/18 [phone]

## 2018-08-18 NOTE — ED Notes (Signed)
Made Dr Laverta Baltimore aware of orthostatic vital changes. Verbal order for NS liter bolus.  Fluids started at and currently running at this time.

## 2018-08-18 NOTE — Discharge Instructions (Signed)
Substance Abuse Treatment Programs ° °Intensive Outpatient Programs °[facility]     °[address]      °[place], [place]                   °[phone]      ° °[facility] °[address] °[place], [place] °[phone] ° °[facility]     °(Inpatient and outpatient)     °[address]           °[phone]   ° °[facility] °336-288-1484 (Suboxone and Methadone) ° °[address]      °[place], [place] [address]      °336-882-2125      ° °[address] °[place], [place] °852-3033 ° °[facility] (Outpatient/Inpatient, Chemical)    °(insurance only) [phone]      °       °[facility] (Groups & Residential) °[place], [place] °336-389-1413 ° °   °[facility]     °[address]     °[place], [place]      °336-389-1413      ° °[facility] (for caregivers and family) °[address] °[place], [place] °336-299-4655 ° ° ° ° ° °Residential Treatment Programs °[facility]      °[address], [place], [place] [address]  °([phone]      ° °[facility] °[address] [place], [place] [address] °[phone] ° °[facility]        °336-248-8914      ° °[facility] °1-800-659-3381 ° °[facility]             °[address]                                         °[place], [place]                                                °877-615-2722 or 336-784-9470                              ° °[facility] [place] °[address] °[place], [address] °1.877.941.8954 ° °[facility]    °[address]      °[place], [place]     °[phone]      ° °[facility] °[address] °[place], [place] °336-285-9073 ° °[facility]   °[address]     °[place], [place] [address]     °336-899-1550      °Admissions: 8am-3pm M-F ° °[facility] °[address] °[place],  [place] °336-227-7417 ° °[facility]: Residential Program (90 Days)   °[place], [place]      °336-725-8389 or 800-758-6077    ° °[facility] °[place], [place] °(Walk in Hours over the weekend or by referral) ° °[facility] °[address], [place],  [address] °([phone]) [phone] ° °Crisis Mobile: [facility]:  1-877-626-1772 (for crisis response 24 hours a day) °[facility] Hotline:      1-800-256-2452 °Outpatient Psychiatry and Counseling ° °[facility]: Mobile Crisis   Management 24 hours:  1-877-626-1772 ° °[facility] sliding scale fee and walk in schedule: M-F 8am-12pm/1pm-3pm °[address]  °[place], [place] [address] °[phone] ° °[facility] °[address] °[place], [place] [address] °336-703-9650 ° °[facility] ([facility] known [facility])- new patient walk-in appointments available Monday - Friday 8am -3pm.          °[address] °[place], [place] [address] °336-676-6840 or crisis line- 336-676-6905 ° °[facility] Intensive Outpatient Therapy Program °[address] °[place], [place] [address] °336-832-9804 ° °[facility]                  °Crisis Services      °336.641.4993      °[address]     °[place], [place] [address]                ° °[facility]   °[facility] °[phone] °[address] °[place], [place] [address] ° ° °[facility]          °[address],  °[place], [place] [address]       °([phone] [phone] ° °[facility] °[address] °[place], [place] [address] °336-292-1510 ° °[facility]    °[address]    °[place], [place] [address]     °[phone]      ° °Parish McKinney, MD     °[address]     °[place] [address]     °336-282-1251     °  °[facility] °[address] °[place] [address] ° °[facility]     °[address]     °[place], [place]      °336-542-2076      ° °[facility] °Shamsher Ahluwalia, MD °[address] °[place], [place] [address] °336-420-9558 ° °[facility]     °[address]     °[place], [place] [address]     °336-274-1237      ° °[facility] °[address] °[place], [place] [address] °336-854-4450 °Resources for Temporary Residential Assistance/Crisis Centers ° °DAY CENTERS °[facility] °M-F 8am-3pm   °[address] [place], [place] [address]   336-332-0824 °Services include: laundry, barbering, support groups, case management, phone  & computer access, showers, AA/NA mtgs, mental health/substance abuse nurse, job skills class, disability information, VA assistance, spiritual classes, etc.  ° °HOMELESS SHELTERS ° °[facility]     °[facility]   °[address], [place]     °[phone]       °       °[facility] (women and children)       °[address] °[place],  [address] °336-275-0820 °[email or web address] for application and process °Application Required ° °[facility]   °[address]    °[place]  [address]     °336.886.4922       °             °[facility] °[address] °,  [address] °336.273.5572 °336-235-0363(schedule application appt.) °Application Required ° °[facility] (women only)    °[address]     °[place],  [address]     °336-884-1039      °  Intake starts 6pm daily °Need valid ID, SSC, & Police report °[facility] [place] °[address] °[place], [place] °336-881-5420 °Application Required ° °[facility] (men only)     °[address]      °[place], [place]     °[phone]      ° °[facility] °(Pregnant women only) °[address] °[place], [place] °336-275-0206 ° °[facility]      °[address]      °[place], [place] [address]     °336-722-9951      °       °[facility] °[address] °[place], [place] °336-723-1848 °90 day commitment/SA/Application process ° °[facility](men only)     °[address]     °[place], [place]     °[phone]       °Check-in at 7pm     °       °[facility] °[address] °[place], [place] [address] °336-248-6684 °Men/Women/Women and Children must be there by 7 pm ° °[facility] °[place], [place] °336-722-8721                ° °

## 2018-08-18 NOTE — ED Notes (Signed)
Pt given sprite 

## 2018-09-11 ENCOUNTER — Inpatient Hospital Stay: Payer: Medicaid Other | Admitting: Family Medicine

## 2019-01-13 ENCOUNTER — Emergency Department (HOSPITAL_COMMUNITY)
Admission: EM | Admit: 2019-01-13 | Discharge: 2019-01-13 | Disposition: A | Discharge status: Home / Self Care | Payer: Medicaid Other | Attending: Emergency Medicine | Admitting: Emergency Medicine

## 2019-01-13 ENCOUNTER — Emergency Department (HOSPITAL_COMMUNITY): Payer: Medicaid Other

## 2019-01-13 ENCOUNTER — Encounter (HOSPITAL_COMMUNITY): Payer: Self-pay | Admitting: Emergency Medicine

## 2019-01-13 ENCOUNTER — Other Ambulatory Visit: Payer: Self-pay

## 2019-01-13 DIAGNOSIS — F1210 Cannabis abuse, uncomplicated: Secondary | ICD-10-CM | POA: Diagnosis not present

## 2019-01-13 DIAGNOSIS — Z7982 Long term (current) use of aspirin: Secondary | ICD-10-CM | POA: Insufficient documentation

## 2019-01-13 DIAGNOSIS — M79601 Pain in right arm: Secondary | ICD-10-CM

## 2019-01-13 DIAGNOSIS — F17210 Nicotine dependence, cigarettes, uncomplicated: Secondary | ICD-10-CM | POA: Insufficient documentation

## 2019-01-13 MED ORDER — NAPROXEN 500 MG PO TABS: 500.0000 mg | ORAL_TABLET | Freq: Two times a day (BID) | ORAL | 0 refills | Status: DC

## 2019-01-13 MED ORDER — PREDNISONE 20 MG PO TABS
60.0000 mg | ORAL_TABLET | Freq: Once | ORAL | Status: AC
  Administered 2019-01-13: 11:00:00 60 mg via ORAL
  Filled 2019-01-13: qty 3

## 2019-01-13 NOTE — Discharge Instructions (Addendum)
You have been seen today for arm pain. Please read and follow all provided instructions. Return to the emergency room for worsening condition or new concerning symptoms.    Your symptoms are probably related to over use of your right arm in your recent job.  1. Medications:  Prescription sent to your pharmacy for naproxen. This is an antiinflammatory medication. Do not take extra ibuprofen, Advil, motrin or goody powder while taking this medicine as they are similar.  Continue usual home medications Take medications as prescribed. Please review all of the medicines and only take them if you do not have an allergy to them.   2. Treatment: rest, drink plenty of fluids  3. Follow Up:  Please follow up with primary care provider by scheduling an appointment as soon as possible for a visit  If you do not have a primary care physician, contact [facility] at [phone] for referral   It is also a possibility that you have an allergic reaction to any of the medicines that you have been prescribed - Everybody reacts differently to medications and while MOST people have no trouble with most medicines, you may have a reaction such as nausea, vomiting, rash, swelling, shortness of breath. If this is the case, please stop taking the medicine immediately and contact your physician.  ?

## 2019-01-13 NOTE — ED Notes (Signed)
Pt is not wearing jewelry. Declined ice.

## 2019-01-13 NOTE — ED Triage Notes (Signed)
Started new job 11/18, patient reports using drill and noticed increasing R arm pain. Patient states she cannot move arm up and pain radiates towards shoulder.

## 2019-01-13 NOTE — ED Provider Notes (Signed)
[facility]-EMERGENCY DEPT Provider Note   CSN: [id number] Arrival date & time: 01/13/19  0847     History Chief Complaint  Patient presents with  . Arm Injury    Suzanne Bullionlizabeth S Subia is a 33 y.o. female right hand dominant with past medical history as listed below presents to emergency department today with chief complaint of right arm pain x 3 weeks. 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 She was working with her hands and using a drill almost all day long.  It is located in her right forearm.  She describes it as an aching sensation.  She states the pain is constant and worse with movement..  She rates the pain 10 of 10 in severity. She has been given Tylenol and ibuprofen without symptom relief.  Denies previous injury to right arm. She denies any fever, chills, rash, numbness, weakness, tingling.  She denies any pain in her right shoulder, neck or back.  History provided by patient with additional history obtained from chart review.     Past Medical History:  Diagnosis Date  . Bacterial infection   . Brain cyst   . Depression   . Dysplasia of cervix, low grade (CIN 1)   . ETOH abuse   . History of chicken pox   . HPV (human papilloma virus) anogenital infection   . LGSIL (low grade squamous intraepithelial dysplasia)   . UTI (urinary tract infection)   . Vaginal trichomoniasis     Patient Active Problem List   Diagnosis Date Noted  . Depression, major, recurrent, moderate (HCC) 07/08/2014  . Alcohol use disorder, severe, dependence (HCC) 07/07/2014    Past Surgical History:  Procedure Laterality Date  . SKIN GRAFT       OB History    Gravida  2   Para  1   Term      Preterm      AB      Living        SAB      TAB      Ectopic      Multiple      Live Births              Family History  Problem Relation Age of Onset  . Diabetes Mother   . Cancer Mother     Social History    Tobacco Use  . Smoking status: Current Every Day Smoker    Packs/day: 0.50    Types: Cigarettes  . Smokeless tobacco: Never Used  Substance Use Topics  . Alcohol use: Yes  . Drug use: Not Currently    Types: Marijuana    Home Medications Prior to Admission medications   Medication Sig Start Date End Date Taking? Authorizing Provider  Aspirin-Acetaminophen-Caffeine (GOODY HEADACHE PO) Take 1 packet by mouth as needed (for pain or headaches).    Yes [provider]  chlordiazePOXIDE (LIBRIUM) 25 MG capsule 50mg  PO TID x 1D, then 25-50mg  PO BID X 1D, then 25-50mg  PO QD X 1D Patient not taking: Reported on 01/13/2019 08/18/18   Long, Arlyss RepressJoshua G, MD  naproxen (NAPROSYN) 500 MG tablet Take 1 tablet (500 mg total) by mouth 2 (two) times daily. 01/13/19   Shadi Larner, Caroleen HammanKaitlyn E, PA-C    Allergies    Patient has no known allergies.  Review of Systems   Review of Systems  All other systems are reviewed and are negative for acute change except as  noted in the HPI.   Physical Exam Updated Vital Signs BP 108/81 (BP Location: Left Arm)   Pulse 81   Temp (!) 97.4 F (36.3 C) (Oral)   Resp 18   Ht 5\' 1"  (1.549 m)   Wt 50.7 kg   LMP 12/28/2018   SpO2 98%   BMI 21.11 kg/m   Physical Exam Vitals and nursing note reviewed.  Constitutional:      Appearance: She is well-developed. She is not ill-appearing or toxic-appearing.  HENT:     Head: Normocephalic and atraumatic.     Nose: Nose normal.  Eyes:     General: No scleral icterus.       Right eye: No discharge.        Left eye: No discharge.     Conjunctiva/sclera: Conjunctivae normal.  Neck:     Vascular: No JVD.     Comments: Full ROM intact without spinous process TTP. No bony stepoffs or deformities, no paraspinous muscle TTP or muscle spasms.  No bruising, erythema, or swelling.  Cardiovascular:     Rate and Rhythm: Normal rate and regular rhythm.     Pulses: Normal pulses.     Heart sounds: Normal heart sounds.   Pulmonary:     Effort: Pulmonary effort is normal.     Breath sounds: Normal breath sounds.  Abdominal:     General: There is no distension.  Musculoskeletal:        General: Normal range of motion.     Cervical back: Normal range of motion.     Comments: Right shoulder,elbow, and wrist without tenderness to palpation and with full ROM. There is no swelling to right upper extremity. She has tender to palpation of right forearm. No overlying erythema, edema, ecchymosis. No open wounds.  There is no anatomic snuff box tenderness. Normal sensation and motor function in the median, ulnar, and radial nerve distributions. 2+ radial pulse.  Upper extremity grip strength 4/5 on the right compared to 5/5 on the left secondary to pain.   Skin:    General: Skin is warm and dry.  Neurological:     Mental Status: She is oriented to person, place, and time.     GCS: GCS eye subscore is 4. GCS verbal subscore is 5. GCS motor subscore is 6.     Comments: Fluent speech, no facial droop.  Psychiatric:        Behavior: Behavior normal.       ED Results / Procedures / Treatments   Labs (all labs ordered are listed, but only abnormal results are displayed) Labs Reviewed - No data to display  EKG None  Radiology DG Elbow Complete Right  Result Date: 01/13/2019 CLINICAL DATA:  Right arm pain after injury. EXAM: RIGHT ELBOW - COMPLETE 3+ VIEW COMPARISON:  None. FINDINGS: There is no evidence of fracture, dislocation, or joint effusion. There is no evidence of arthropathy or other focal bone abnormality. Soft tissues are unremarkable. IMPRESSION: Negative. Electronically Signed   By: Marijo Conception M.D.   On: 01/13/2019 09:30    Procedures Procedures (including critical care time)  Medications Ordered in ED Medications  predniSONE (DELTASONE) tablet 60 mg (60 mg Oral Given 01/13/19 1050)    ED Course  I have reviewed the triage vital signs and the nursing notes.  Pertinent labs & imaging  results that were available during my care of the patient were reviewed by me and considered in my medical decision making (see chart for details).  MDM Rules/Calculators/A&P Patient presents to the ED with complaints of pain to the right forearm s/p overuse at work. Exam without obvious deformity or open wounds. ROM intact. Tender to palpation over forearm. Elbow is non tender to palpation. NVI distally. Xray of right elbow ordered in triage and viewed be me is negative for fracture/dislocation. PRICE and naproxen recommended. Shedenis any chance of pregnancy.  I discussed results, treatment plan, need for follow-up, and return precautions with the patient. Provided opportunity for questions, patient confirmed understanding and are in agreement with plan. The patient is safe for discharge with strict return precautions discussed.  Recommend pcp follow up if symptoms persist. Findings and plan of care discussed with supervising physician Dr. Anitra Lauth.   Portions of this note were generated with Scientist, clinical (histocompatibility and immunogenetics). Dictation errors may occur despite best attempts at proofreading.   Final Clinical Impression(s) / ED Diagnoses Final diagnoses:  Pain of right upper extremity    Rx / DC Orders ED Discharge Orders         Ordered    naproxen (NAPROSYN) 500 MG tablet  2 times daily     01/13/19 1050           Kathyrn Lass 01/13/19 1205    Gwyneth Sprout, MD 01/13/19 2116

## 2019-04-17 ENCOUNTER — Ambulatory Visit: Payer: Medicaid Other | Attending: Internal Medicine

## 2019-04-17 DIAGNOSIS — Z23 Encounter for immunization: Secondary | ICD-10-CM

## 2019-04-17 NOTE — Progress Notes (Signed)
   Covid-19 Vaccination Clinic  Name:  Suzanne [name]    MRN: [id number] DOB: 12/22/1985  04/17/2019  [name] was observed post Covid-19 immunization for 15 minutes without incident. She was provided with Vaccine Information Sheet and instruction to access the V-Safe system.   [name] was instructed to call 911 with any severe reactions post vaccine: Marland Kitchen Difficulty breathing  . Swelling of face and throat  . A fast heartbeat  . A bad rash all over body  . Dizziness and weakness   Immunizations Administered    Name Date Dose VIS Date Route   Pfizer COVID-19 Vaccine 04/17/2019 12:42 PM 0.3 mL 01/09/2019 Intramuscular   Manufacturer: [employer], [employer]   Lot: [id number]   NDC: [address]-2

## 2019-05-06 ENCOUNTER — Telehealth: Payer: Medicaid Other | Admitting: Internal Medicine

## 2019-05-12 ENCOUNTER — Ambulatory Visit: Payer: Medicaid Other | Attending: Internal Medicine

## 2019-05-12 DIAGNOSIS — Z23 Encounter for immunization: Secondary | ICD-10-CM

## 2019-05-12 NOTE — Progress Notes (Signed)
   Covid-19 Vaccination Clinic  Name:  Suzanne [name]    MRN: [id number] DOB: 08/26/1985  05/12/2019  [name] was observed post Covid-19 immunization for 15 minutes without incident. She was provided with Vaccine Information Sheet and instruction to access the V-Safe system.   [name] was instructed to call 911 with any severe reactions post vaccine: Marland Kitchen Difficulty breathing  . Swelling of face and throat  . A fast heartbeat  . A bad rash all over body  . Dizziness and weakness   Immunizations Administered    Name Date Dose VIS Date Route   Pfizer COVID-19 Vaccine 05/12/2019  1:33 PM 0.3 mL 01/09/2019 Intramuscular   Manufacturer: [employer], [employer]   Lot: [id number]   NDC: [address]-0

## 2019-06-15 ENCOUNTER — Observation Stay (HOSPITAL_COMMUNITY)
Admission: EM | Admit: 2019-06-15 | Discharge: 2019-06-16 | Disposition: A | Discharge status: Home / Self Care | Payer: Medicaid Other | Attending: Physician Assistant | Admitting: Physician Assistant

## 2019-06-15 ENCOUNTER — Encounter (HOSPITAL_COMMUNITY): Payer: Self-pay | Admitting: Emergency Medicine

## 2019-06-15 ENCOUNTER — Other Ambulatory Visit: Payer: Self-pay

## 2019-06-15 ENCOUNTER — Emergency Department (HOSPITAL_COMMUNITY): Payer: Medicaid Other

## 2019-06-15 ENCOUNTER — Emergency Department (HOSPITAL_COMMUNITY): Payer: Medicaid Other | Admitting: Anesthesiology

## 2019-06-15 ENCOUNTER — Encounter (HOSPITAL_COMMUNITY)
Admission: EM | Disposition: A | Discharge status: Home / Self Care | Payer: Self-pay | Source: Home / Self Care | Attending: Emergency Medicine

## 2019-06-15 DIAGNOSIS — K3580 Unspecified acute appendicitis: Principal | ICD-10-CM | POA: Insufficient documentation

## 2019-06-15 DIAGNOSIS — N730 Acute parametritis and pelvic cellulitis: Secondary | ICD-10-CM | POA: Diagnosis present

## 2019-06-15 DIAGNOSIS — F1020 Alcohol dependence, uncomplicated: Secondary | ICD-10-CM | POA: Insufficient documentation

## 2019-06-15 DIAGNOSIS — N83201 Unspecified ovarian cyst, right side: Secondary | ICD-10-CM

## 2019-06-15 DIAGNOSIS — F17210 Nicotine dependence, cigarettes, uncomplicated: Secondary | ICD-10-CM | POA: Insufficient documentation

## 2019-06-15 DIAGNOSIS — Z79899 Other long term (current) drug therapy: Secondary | ICD-10-CM | POA: Diagnosis not present

## 2019-06-15 DIAGNOSIS — N739 Female pelvic inflammatory disease, unspecified: Secondary | ICD-10-CM | POA: Insufficient documentation

## 2019-06-15 DIAGNOSIS — Z20822 Contact with and (suspected) exposure to covid-19: Secondary | ICD-10-CM | POA: Diagnosis not present

## 2019-06-15 DIAGNOSIS — F329 Major depressive disorder, single episode, unspecified: Secondary | ICD-10-CM | POA: Insufficient documentation

## 2019-06-15 DIAGNOSIS — K352 Acute appendicitis with generalized peritonitis, without abscess: Secondary | ICD-10-CM

## 2019-06-15 HISTORY — PX: LAPAROSCOPIC APPENDECTOMY: SHX408

## 2019-06-15 LAB — COMPREHENSIVE METABOLIC PANEL
ALT: 47 U/L — ABNORMAL HIGH (ref 0–44)
AST: 32 U/L (ref 15–41)
Albumin: 3.9 g/dL (ref 3.5–5.0)
Alkaline Phosphatase: 104 U/L (ref 38–126)
Anion gap: 12 (ref 5–15)
BUN: 8 mg/dL (ref 6–20)
CO2: 22 mmol/L (ref 22–32)
Calcium: 8.8 mg/dL — ABNORMAL LOW (ref 8.9–10.3)
Chloride: 103 mmol/L (ref 98–111)
Creatinine, Ser: 0.63 mg/dL (ref 0.44–1.00)
GFR calc Af Amer: >60 mL/min (ref >60)
GFR calc non Af Amer: >60 mL/min (ref >60)
Glucose, Bld: 141 mg/dL — ABNORMAL HIGH (ref 70–99)
Potassium: 4.2 mmol/L (ref 3.5–5.1)
Sodium: 137 mmol/L (ref 135–145)
Total Bilirubin: 0.8 mg/dL (ref 0.3–1.2)
Total Protein: 6.8 g/dL (ref 6.5–8.1)

## 2019-06-15 LAB — CBC WITH DIFFERENTIAL/PLATELET
Abs Immature Granulocytes: 0.03 10*3/uL (ref 0.00–0.07)
Basophils Absolute: 0 10*3/uL (ref 0.0–0.1)
Basophils Relative: 0 %
Eosinophils Absolute: 0 10*3/uL (ref 0.0–0.5)
Eosinophils Relative: 0 %
HCT: 36.8 % (ref 36.0–46.0)
Hemoglobin: 12.5 g/dL (ref 12.0–15.0)
Immature Granulocytes: 0 %
Lymphocytes Relative: 11 %
Lymphs Abs: 1.2 10*3/uL (ref 0.7–4.0)
MCH: 35.8 pg — ABNORMAL HIGH (ref 26.0–34.0)
MCHC: 34 g/dL (ref 30.0–36.0)
MCV: 105.4 fL — ABNORMAL HIGH (ref 80.0–100.0)
Monocytes Absolute: 0.4 10*3/uL (ref 0.1–1.0)
Monocytes Relative: 4 %
Neutro Abs: 9 10*3/uL — ABNORMAL HIGH (ref 1.7–7.7)
Neutrophils Relative %: 85 %
Platelets: 183 10*3/uL (ref 150–400)
RBC: 3.49 MIL/uL — ABNORMAL LOW (ref 3.87–5.11)
RDW: 13.3 % (ref 11.5–15.5)
WBC: 10.7 10*3/uL — ABNORMAL HIGH (ref 4.0–10.5)
nRBC: 0 % (ref 0.0–0.2)

## 2019-06-15 LAB — SARS CORONAVIRUS 2 BY RT PCR (HOSPITAL ORDER, PERFORMED IN ~~LOC~~ HOSPITAL LAB): SARS Coronavirus 2: NEGATIVE

## 2019-06-15 LAB — URINALYSIS, ROUTINE W REFLEX MICROSCOPIC
Bilirubin Urine: NEGATIVE
Glucose, UA: NEGATIVE mg/dL
Hgb urine dipstick: NEGATIVE
Ketones, ur: NEGATIVE mg/dL
Leukocytes,Ua: NEGATIVE
Nitrite: NEGATIVE
Protein, ur: NEGATIVE mg/dL
Specific Gravity, Urine: 1.013 (ref 1.005–1.030)
pH: 7 (ref 5.0–8.0)

## 2019-06-15 LAB — PREGNANCY, URINE: Preg Test, Ur: NEGATIVE

## 2019-06-15 SURGERY — APPENDECTOMY, LAPAROSCOPIC
Anesthesia: General | Site: Abdomen

## 2019-06-15 MED ORDER — LIDOCAINE 2% (20 MG/ML) 5 ML SYRINGE
INTRAMUSCULAR | Status: DC | PRN
  Administered 2019-06-15: 80 mg via INTRAVENOUS

## 2019-06-15 MED ORDER — ONDANSETRON HCL 4 MG/2ML IJ SOLN
4.0000 mg | Freq: Once | INTRAMUSCULAR | Status: AC
  Administered 2019-06-15: 4 mg via INTRAVENOUS
  Filled 2019-06-15: qty 2

## 2019-06-15 MED ORDER — KETAMINE HCL 10 MG/ML IJ SOLN
INTRAMUSCULAR | Status: AC
  Filled 2019-06-15: qty 1

## 2019-06-15 MED ORDER — FENTANYL CITRATE (PF) 100 MCG/2ML IJ SOLN
INTRAMUSCULAR | Status: AC
  Filled 2019-06-15: qty 2

## 2019-06-15 MED ORDER — CELECOXIB 200 MG PO CAPS
200.0000 mg | ORAL_CAPSULE | ORAL | Status: AC
  Administered 2019-06-15: 200 mg via ORAL
  Filled 2019-06-15 (×2): qty 1

## 2019-06-15 MED ORDER — SUGAMMADEX SODIUM 200 MG/2ML IV SOLN
INTRAVENOUS | Status: DC | PRN
  Administered 2019-06-15: 100 mg via INTRAVENOUS

## 2019-06-15 MED ORDER — BUPIVACAINE-EPINEPHRINE 0.5% -1:200000 IJ SOLN
INTRAMUSCULAR | Status: AC
  Filled 2019-06-15: qty 1

## 2019-06-15 MED ORDER — POTASSIUM CHLORIDE 2 MEQ/ML IV SOLN: INTRAVENOUS | Status: DC

## 2019-06-15 MED ORDER — PIPERACILLIN-TAZOBACTAM 3.375 G IVPB
3.3750 g | Freq: Three times a day (TID) | INTRAVENOUS | Status: DC
  Administered 2019-06-15 – 2019-06-16 (×3): 3.375 g via INTRAVENOUS
  Filled 2019-06-15 (×3): qty 50

## 2019-06-15 MED ORDER — HYDROMORPHONE HCL 1 MG/ML IJ SOLN
0.5000 mg | Freq: Once | INTRAMUSCULAR | Status: AC
  Administered 2019-06-15: 0.5 mg via INTRAVENOUS
  Filled 2019-06-15: qty 1

## 2019-06-15 MED ORDER — DOXYCYCLINE HYCLATE 50 MG PO CAPS: 100.0000 mg | ORAL_CAPSULE | Freq: Two times a day (BID) | ORAL | 0 refills | Status: AC

## 2019-06-15 MED ORDER — ONDANSETRON 4 MG PO TBDP: 4.0000 mg | ORAL_TABLET | Freq: Four times a day (QID) | ORAL | Status: DC | PRN

## 2019-06-15 MED ORDER — METRONIDAZOLE IN NACL 5-0.79 MG/ML-% IV SOLN
500.0000 mg | Freq: Once | INTRAVENOUS | Status: AC
  Administered 2019-06-15: 500 mg via INTRAVENOUS
  Filled 2019-06-15: qty 100

## 2019-06-15 MED ORDER — IOHEXOL 300 MG/ML  SOLN
100.0000 mL | Freq: Once | INTRAMUSCULAR | Status: AC | PRN
  Administered 2019-06-15: 100 mL via INTRAVENOUS

## 2019-06-15 MED ORDER — PROPOFOL 10 MG/ML IV BOLUS
INTRAVENOUS | Status: AC
  Filled 2019-06-15: qty 20

## 2019-06-15 MED ORDER — OXYCODONE HCL 5 MG PO TABS
5.0000 mg | ORAL_TABLET | ORAL | Status: DC | PRN
  Administered 2019-06-15 – 2019-06-16 (×4): 5 mg via ORAL
  Filled 2019-06-15 (×4): qty 1

## 2019-06-15 MED ORDER — FENTANYL CITRATE (PF) 100 MCG/2ML IJ SOLN
INTRAMUSCULAR | Status: AC
  Administered 2019-06-15: 50 ug via INTRAVENOUS
  Filled 2019-06-15: qty 2

## 2019-06-15 MED ORDER — FENTANYL CITRATE (PF) 100 MCG/2ML IJ SOLN
INTRAMUSCULAR | Status: DC | PRN
  Administered 2019-06-15: 25 ug via INTRAVENOUS
  Administered 2019-06-15: 50 ug via INTRAVENOUS
  Administered 2019-06-15: 25 ug via INTRAVENOUS
  Administered 2019-06-15: 100 ug via INTRAVENOUS

## 2019-06-15 MED ORDER — ONDANSETRON HCL 4 MG/2ML IJ SOLN: 4.0000 mg | Freq: Four times a day (QID) | INTRAMUSCULAR | Status: DC | PRN

## 2019-06-15 MED ORDER — DIPHENHYDRAMINE HCL 50 MG/ML IJ SOLN: 25.0000 mg | Freq: Four times a day (QID) | INTRAMUSCULAR | Status: DC | PRN

## 2019-06-15 MED ORDER — SODIUM CHLORIDE 0.9 % IR SOLN
Status: DC | PRN
  Administered 2019-06-15: 1

## 2019-06-15 MED ORDER — ONDANSETRON HCL 4 MG/2ML IJ SOLN
INTRAMUSCULAR | Status: DC | PRN
  Administered 2019-06-15: 4 mg via INTRAVENOUS

## 2019-06-15 MED ORDER — METOPROLOL TARTRATE 5 MG/5ML IV SOLN: 5.0000 mg | Freq: Four times a day (QID) | INTRAVENOUS | Status: DC | PRN

## 2019-06-15 MED ORDER — FENTANYL CITRATE (PF) 100 MCG/2ML IJ SOLN
25.0000 ug | INTRAMUSCULAR | Status: DC | PRN
  Administered 2019-06-15 (×2): 50 ug via INTRAVENOUS

## 2019-06-15 MED ORDER — KCL IN DEXTROSE-NACL 10-5-0.45 MEQ/L-%-% IV SOLN
INTRAVENOUS | Status: DC
  Filled 2019-06-15 (×3): qty 1000

## 2019-06-15 MED ORDER — DIPHENHYDRAMINE HCL 25 MG PO CAPS
25.0000 mg | ORAL_CAPSULE | Freq: Four times a day (QID) | ORAL | Status: DC | PRN
  Administered 2019-06-15: 25 mg via ORAL
  Filled 2019-06-15: qty 1

## 2019-06-15 MED ORDER — BOOST / RESOURCE BREEZE PO LIQD CUSTOM
1.0000 | Freq: Three times a day (TID) | ORAL | Status: DC
  Administered 2019-06-15 – 2019-06-16 (×2): 1 via ORAL

## 2019-06-15 MED ORDER — ROCURONIUM BROMIDE 10 MG/ML (PF) SYRINGE
PREFILLED_SYRINGE | INTRAVENOUS | Status: DC | PRN
  Administered 2019-06-15: 40 mg via INTRAVENOUS

## 2019-06-15 MED ORDER — ACETAMINOPHEN 500 MG PO TABS
1000.0000 mg | ORAL_TABLET | Freq: Four times a day (QID) | ORAL | Status: DC
  Administered 2019-06-15 – 2019-06-16 (×3): 1000 mg via ORAL
  Filled 2019-06-15 (×3): qty 2

## 2019-06-15 MED ORDER — HYDROMORPHONE HCL 1 MG/ML IJ SOLN
1.0000 mg | Freq: Once | INTRAMUSCULAR | Status: AC
  Administered 2019-06-15: 1 mg via INTRAVENOUS
  Filled 2019-06-15: qty 1

## 2019-06-15 MED ORDER — PROPOFOL 10 MG/ML IV BOLUS
INTRAVENOUS | Status: DC | PRN
  Administered 2019-06-15: 150 mg via INTRAVENOUS

## 2019-06-15 MED ORDER — BUPIVACAINE HCL 0.25 % IJ SOLN
INTRAMUSCULAR | Status: DC | PRN
  Administered 2019-06-15: 47 mL

## 2019-06-15 MED ORDER — METRONIDAZOLE 500 MG PO TABS
500.0000 mg | ORAL_TABLET | Freq: Three times a day (TID) | ORAL | 0 refills | Status: AC
Start: 2019-06-15 — End: 2019-06-22

## 2019-06-15 MED ORDER — SODIUM CHLORIDE 0.9 % IV BOLUS
1000.0000 mL | Freq: Once | INTRAVENOUS | Status: AC
  Administered 2019-06-15: 1000 mL via INTRAVENOUS

## 2019-06-15 MED ORDER — ACETAMINOPHEN 500 MG PO TABS
1000.0000 mg | ORAL_TABLET | ORAL | Status: AC
  Administered 2019-06-15: 1000 mg via ORAL
  Filled 2019-06-15: qty 2

## 2019-06-15 MED ORDER — PROMETHAZINE HCL 25 MG/ML IJ SOLN: 6.2500 mg | INTRAMUSCULAR | Status: DC | PRN

## 2019-06-15 MED ORDER — DOCUSATE SODIUM 100 MG PO CAPS
100.0000 mg | ORAL_CAPSULE | Freq: Two times a day (BID) | ORAL | Status: DC
  Administered 2019-06-15 – 2019-06-16 (×2): 100 mg via ORAL
  Filled 2019-06-15 (×2): qty 1

## 2019-06-15 MED ORDER — ENOXAPARIN SODIUM 40 MG/0.4ML ~~LOC~~ SOLN
40.0000 mg | SUBCUTANEOUS | Status: DC
  Filled 2019-06-15: qty 0.4

## 2019-06-15 MED ORDER — ROCURONIUM BROMIDE 10 MG/ML (PF) SYRINGE
PREFILLED_SYRINGE | INTRAVENOUS | Status: AC
  Filled 2019-06-15: qty 10

## 2019-06-15 MED ORDER — MIDAZOLAM HCL 5 MG/5ML IJ SOLN
INTRAMUSCULAR | Status: DC | PRN
  Administered 2019-06-15: 2 mg via INTRAVENOUS

## 2019-06-15 MED ORDER — SIMETHICONE 80 MG PO CHEW: 40.0000 mg | CHEWABLE_TABLET | Freq: Four times a day (QID) | ORAL | Status: DC | PRN

## 2019-06-15 MED ORDER — LACTATED RINGERS IV SOLN: INTRAVENOUS | Status: DC

## 2019-06-15 MED ORDER — SODIUM CHLORIDE 0.9 % IV SOLN
2.0000 g | Freq: Once | INTRAVENOUS | Status: AC
  Administered 2019-06-15: 2 g via INTRAVENOUS
  Filled 2019-06-15: qty 20

## 2019-06-15 MED ORDER — SUCCINYLCHOLINE CHLORIDE 200 MG/10ML IV SOSY
PREFILLED_SYRINGE | INTRAVENOUS | Status: DC | PRN
  Administered 2019-06-15: 80 mg via INTRAVENOUS

## 2019-06-15 MED ORDER — SODIUM CHLORIDE (PF) 0.9 % IJ SOLN
INTRAMUSCULAR | Status: AC
  Filled 2019-06-15: qty 50

## 2019-06-15 MED ORDER — MIDAZOLAM HCL 2 MG/2ML IJ SOLN
INTRAMUSCULAR | Status: AC
  Filled 2019-06-15: qty 2

## 2019-06-15 MED ORDER — MORPHINE SULFATE (PF) 2 MG/ML IV SOLN
1.0000 mg | INTRAVENOUS | Status: DC | PRN
  Administered 2019-06-15: 2 mg via INTRAVENOUS
  Filled 2019-06-15: qty 1

## 2019-06-15 MED ORDER — BUPIVACAINE HCL 0.25 % IJ SOLN
INTRAMUSCULAR | Status: AC
  Filled 2019-06-15: qty 1

## 2019-06-15 MED ORDER — KETAMINE HCL 10 MG/ML IJ SOLN
INTRAMUSCULAR | Status: DC | PRN
  Administered 2019-06-15: 15 mg via INTRAVENOUS
  Administered 2019-06-15: 10 mg via INTRAVENOUS

## 2019-06-15 MED ORDER — CELECOXIB 200 MG PO CAPS
200.0000 mg | ORAL_CAPSULE | Freq: Two times a day (BID) | ORAL | Status: DC
  Administered 2019-06-15 – 2019-06-16 (×2): 200 mg via ORAL
  Filled 2019-06-15 (×2): qty 1

## 2019-06-15 MED ORDER — DEXAMETHASONE SODIUM PHOSPHATE 10 MG/ML IJ SOLN
INTRAMUSCULAR | Status: DC | PRN
  Administered 2019-06-15: 10 mg via INTRAVENOUS

## 2019-06-15 SURGICAL SUPPLY — 41 items
APPLIER CLIP 5 13 M/L LIGAMAX5 (MISCELLANEOUS) ×3
APPLIER CLIP ROT 10 11.4 M/L (STAPLE)
CABLE HIGH FREQUENCY MONO STRZ (ELECTRODE) IMPLANT
CHLORAPREP W/TINT 26 (MISCELLANEOUS) ×3 IMPLANT
CLIP APPLIE 5 13 M/L LIGAMAX5 (MISCELLANEOUS) ×1 IMPLANT
CLIP APPLIE ROT 10 11.4 M/L (STAPLE) IMPLANT
COVER SURGICAL LIGHT HANDLE (MISCELLANEOUS) ×3 IMPLANT
COVER WAND RF STERILE (DRAPES) ×3 IMPLANT
CUTTER FLEX LINEAR 45M (STAPLE) ×3 IMPLANT
DECANTER SPIKE VIAL GLASS SM (MISCELLANEOUS) ×3 IMPLANT
DERMABOND ADVANCED (GAUZE/BANDAGES/DRESSINGS) ×2
DERMABOND ADVANCED .7 DNX12 (GAUZE/BANDAGES/DRESSINGS) ×1 IMPLANT
DRAIN CHANNEL 19F RND (DRAIN) IMPLANT
DRAPE LAPAROSCOPIC ABDOMINAL (DRAPES) ×3 IMPLANT
ELECT PENCIL ROCKER SW 15FT (MISCELLANEOUS) ×3 IMPLANT
ELECT REM PT RETURN 15FT ADLT (MISCELLANEOUS) ×3 IMPLANT
ENDOLOOP SUT PDS II  0 18 (SUTURE)
ENDOLOOP SUT PDS II 0 18 (SUTURE) IMPLANT
EVACUATOR SILICONE 100CC (DRAIN) IMPLANT
GLOVE BIO SURGEON STRL SZ7.5 (GLOVE) ×3 IMPLANT
GLOVE INDICATOR 8.0 STRL GRN (GLOVE) ×3 IMPLANT
GOWN STRL REUS W/TWL XL LVL3 (GOWN DISPOSABLE) ×6 IMPLANT
KIT BASIN (CUSTOM PROCEDURE TRAY) ×3 IMPLANT
KIT TURNOVER KIT A (KITS) ×3 IMPLANT
POUCH SPECIMEN RETRIEVAL 10MM (ENDOMECHANICALS) ×3 IMPLANT
RELOAD 45 VASCULAR/THIN (ENDOMECHANICALS) IMPLANT
RELOAD STAPLE TA45 3.5 REG BLU (ENDOMECHANICALS) ×3 IMPLANT
SCISSORS LAP 5X35 DISP (ENDOMECHANICALS) IMPLANT
SET IRRIG TUBING LAPAROSCOPIC (IRRIGATION / IRRIGATOR) ×3 IMPLANT
SET TUBE SMOKE EVAC HIGH FLOW (TUBING) ×3 IMPLANT
SHEARS HARMONIC ACE PLUS 36CM (ENDOMECHANICALS) ×3 IMPLANT
SLEEVE ADV FIXATION 5X100MM (TROCAR) ×3 IMPLANT
SUT ETHILON 3 0 PS 1 (SUTURE) IMPLANT
SUT MNCRL AB 4-0 PS2 18 (SUTURE) ×3 IMPLANT
TOWEL OR 17X26 10 PK STRL BLUE (TOWEL DISPOSABLE) ×3 IMPLANT
TOWEL OR NON WOVEN STRL DISP B (DISPOSABLE) ×3 IMPLANT
TRAY FOLEY MTR SLVR 14FR STAT (SET/KITS/TRAYS/PACK) ×3 IMPLANT
TRAY FOLEY MTR SLVR 16FR STAT (SET/KITS/TRAYS/PACK) IMPLANT
TRAY LAPAROSCOPIC (CUSTOM PROCEDURE TRAY) ×3 IMPLANT
TROCAR ADV FIXATION 5X100MM (TROCAR) ×3 IMPLANT
TROCAR XCEL BLUNT TIP 100MML (ENDOMECHANICALS) ×3 IMPLANT

## 2019-06-15 NOTE — H&P (Signed)
FLORNCE RECORD 09-Feb-1985  [id number].    Chief Complaint/Reason for Consult: acute appendicitis  HPI:  This is a 34yo white female with a history of ETOH abuse, last drink over a week ago with no withdrawal type symptoms.  She began having an acute onset of RLQ abdominal pain 2 days ago.  She has had some N, but no emesis or diarrhea.  She denies any fevers.  She admits to some discomfort with voiding, but denies any painful intercourse recently.  She states that she hasn't seen a GYN in several years and was unaware that she had a right adnexal cyst.  She has been unable to sleep for the last 2 nights.  Because of persistent pain that was not helped by anything, she presented to the [facility].  She has been found to have a dilated appendix up to 4mm and some surrounding inflammatory changes.  She also has a loculated right adnexal cyst as well, but felt this was separate from her appendiceal findings.  We have been asked to see her for surgical evaluation.  She has had both covid vaccines.  ROS: ROS: Please see HPI, otherwise all other systems have been reviewed and are negative.  Family History  Problem Relation Age of Onset  . Diabetes Mother   . Cancer Mother     Past Medical History:  Diagnosis Date  . Bacterial infection   . Brain cyst   . Depression   . Dysplasia of cervix, low grade (CIN 1)   . ETOH abuse   . History of chicken pox   . HPV (human papilloma virus) anogenital infection   . LGSIL (low grade squamous intraepithelial dysplasia)   . UTI (urinary tract infection)   . Vaginal trichomoniasis     Past Surgical History:  Procedure Laterality Date  . SKIN GRAFT      Social History:  reports that she has been smoking cigarettes. She has been smoking about 0.50 packs per day. She has never used smokeless tobacco. She reports current alcohol use. She reports previous drug use. Drug: Marijuana.  Allergies: No Known Allergies  (Not in a hospital  admission)    Physical Exam: Blood pressure 125/83, pulse 94, temperature 98.5 F (36.9 C), temperature source Oral, resp. rate 16, height 5\' 1"  (1.549 m), weight 50.3 kg, last menstrual period 06/08/2019, SpO2 98 %. General: pleasant, WD, WN white female who is laying in bed in mild distress secondary to pain HEENT: head is normocephalic, atraumatic.  Sclera are noninjected.  PERRL.  Ears and nose without any masses or lesions.  Mouth is pink and moist Heart: regular, rate, and rhythm.  Normal s1,s2. No obvious murmurs, gallops, or rubs noted.  Palpable radial and pedal pulses bilaterally Lungs: CTAB, no wheezes, rhonchi, or rales noted.  Respiratory effort nonlabored Abd: soft, tender in RLQ at McBurney's point, ND, hypoactive BS, no masses, hernias, or organomegaly MS: all 4 extremities are symmetrical with no cyanosis, clubbing, or edema. Skin: warm and dry with no masses, lesions, or rashes Neuro: Cranial nerves 2-12 grossly intact, sensation is normal throughout Psych: A&Ox3 with an appropriate affect.   Results for orders placed or performed during the hospital encounter of 06/15/19 (from the past 48 hour(s))  Urinalysis, Routine w reflex microscopic- may I&O cath if menses     Status: None   Collection Time: 06/15/19  6:52 AM  Result Value Ref Range   Color, Urine YELLOW YELLOW   APPearance CLEAR CLEAR  Specific Gravity, Urine 1.013 1.005 - 1.030   pH 7.0 5.0 - 8.0   Glucose, UA NEGATIVE NEGATIVE mg/dL   Hgb urine dipstick NEGATIVE NEGATIVE   Bilirubin Urine NEGATIVE NEGATIVE   Ketones, ur NEGATIVE NEGATIVE mg/dL   Protein, ur NEGATIVE NEGATIVE mg/dL   Nitrite NEGATIVE NEGATIVE   Leukocytes,Ua NEGATIVE NEGATIVE    Comment: Performed at [facility], [address] [place], [place] [address]  Pregnancy, urine     Status: None   Collection Time: 06/15/19  6:52 AM  Result Value Ref Range   Preg Test, Ur NEGATIVE NEGATIVE    Comment: Performed at [facility], [address], [place], [place] [address]  Comprehensive metabolic panel     Status: Abnormal   Collection Time: 06/15/19  7:29 AM  Result Value Ref Range   Sodium 137 135 - 145 mmol/L   Potassium 4.2 3.5 - 5.1 mmol/L    Comment: SLIGHT HEMOLYSIS   Chloride 103 98 - 111 mmol/L   CO2 22 22 - 32 mmol/L   Glucose, Bld 141 (H) 70 - 99 mg/dL    Comment: Glucose reference range applies only to samples taken after fasting for at least 8 hours.   BUN 8 6 - 20 mg/dL   Creatinine, Ser 6.60 0.44 - 1.00 mg/dL   Calcium 8.8 (L) 8.9 - 10.3 mg/dL   Total Protein 6.8 6.5 - 8.1 g/dL   Albumin 3.9 3.5 - 5.0 g/dL   AST 32 15 - 41 U/L   ALT 47 (H) 0 - 44 U/L   Alkaline Phosphatase 104 38 - 126 U/L   Total Bilirubin 0.8 0.3 - 1.2 mg/dL   GFR calc non Af Amer >60 >60 mL/min   GFR calc Af Amer >60 >60 mL/min   Anion gap 12 5 - 15    Comment: Performed at [facility], [address], [place], [place] [address]  CBC with Differential     Status: Abnormal   Collection Time: 06/15/19  7:29 AM  Result Value Ref Range   WBC 10.7 (H) 4.0 - 10.5 K/uL   RBC 3.49 (L) 3.87 - 5.11 MIL/uL   Hemoglobin 12.5 12.0 - 15.0 g/dL   HCT 01.0 93.2 - 35.5 %   MCV 105.4 (H) 80.0 - 100.0 fL   MCH 35.8 (H) 26.0 - 34.0 pg   MCHC 34.0 30.0 - 36.0 g/dL   RDW 73.2 20.2 - 54.2 %   Platelets 183 150 - 400 K/uL   nRBC 0.0 0.0 - 0.2 %   Neutrophils Relative % 85 %   Neutro Abs 9.0 (H) 1.7 - 7.7 K/uL   Lymphocytes Relative 11 %   Lymphs Abs 1.2 0.7 - 4.0 K/uL   Monocytes Relative 4 %   Monocytes Absolute 0.4 0.1 - 1.0 K/uL   Eosinophils Relative 0 %   Eosinophils Absolute 0.0 0.0 - 0.5 K/uL   Basophils Relative 0 %   Basophils Absolute 0.0 0.0 - 0.1 K/uL   Immature Granulocytes 0 %   Abs Immature Granulocytes 0.03 0.00 - 0.07 K/uL    Comment: Performed at [facility], [address], [place], [place] [address]   CT Abdomen Pelvis W Contrast  Result Date:  06/15/2019 CLINICAL DATA:  Abdominal pain, primarily right-sided EXAM: CT ABDOMEN AND PELVIS WITH CONTRAST TECHNIQUE: Multidetector CT imaging of the abdomen and pelvis was performed using the standard protocol following bolus administration of intravenous contrast. CONTRAST:  173mL OMNIPAQUE IOHEXOL 300 MG/ML  SOLN COMPARISON:  None. FINDINGS: Lower chest: Lung bases are clear. Hepatobiliary: No focal liver lesions are evident. Gallbladder wall is not appreciably thickened. There is no biliary duct dilatation. Pancreas: There is no pancreatic mass or inflammatory focus. Spleen: No splenic lesions are evident. Adrenals/Urinary Tract: Adrenals bilaterally appear normal. Kidneys bilaterally show no evident mass or hydronephrosis on either side. There is no appreciable renal or ureteral calculus on either side. Note that foci of contrast within the renal collecting systems potentially could mask small calculi. Urinary bladder is midline with wall thickness within normal limits. Stomach/Bowel: There is no appreciable bowel wall or mesenteric thickening. There is moderate stool in the colon. No bowel obstruction evident. The terminal ileum appears within normal limits. There is no evident free air or portal venous air. Vascular/Lymphatic: There is no abdominal aortic aneurysm. No arterial vascular lesions are evident. Major venous structures appear patent. There is no adenopathy in the abdomen or pelvis. Reproductive: Uterus is anteverted. There is a somewhat complex cystic lesion in the right adnexal region measuring 3.7 x 3.4 cm. No other pelvic mass demonstrable. There is a small amount of fluid in the cul-de-sac region. Other: The appendix is dilated with a maximum diameter of 1.3 cm. The wall of the appendix appears rather ill-defined. There is no associated fluid or abscess. No perforation in this area. There is no ascites beyond mild fluid in the cul-de-sac region. No abscess evident in the abdomen or pelvis.  Musculoskeletal: There is degenerative change in the lower thoracic and lumbar regions. There is no abdominal wall or intramuscular lesions. IMPRESSION: 1. Findings felt to be indicative of acute appendiceal inflammation. Appendix: Location: Appendix is located in the right mid to lower pelvis arising inferiorly from the cecum. Diameter: Up to 13 mm Appendicolith: None Mucosal hyper-enhancement: None. Appendiceal wall appears rather ill-defined. Extraluminal gas: None Periappendiceal collection: None 2.  No bowel obstruction.  No abscess in the abdomen or pelvis. 3. Somewhat complex septated right cystic adnexal mass measuring 3.7 x 3.4 cm. Statistically most likely hemorrhagic cyst. Pelvic ultrasound correlation advise nonemergently, after treatment for acute appendicitis. 4. Mild fluid in the cul-de-sac which appears slightly loculated. Question recent ovarian cyst rupture/leakage versus reactive fluid from appendicitis. 5. No renal or ureteral calculus. No hydronephrosis. Urinary bladder wall thickness within normal limits. Critical Value/emergent results were called by telephone at the time of interpretation on 06/15/2019 at 9:51 am to provider Martinique ROBINSON , who verbally acknowledged these results. Electronically Signed   By: Lowella Grip III M.D.   On: 06/15/2019 09:51      Assessment/Plan ETOH abuse, no ETOH in just over a week R adnexal septated cyst - she will need to follow up with GYN as an outpatient for further evaluation.  This was discussed with the patient.  Acute appendicitis The patient's store is c/w appendicitis.  Her labs as well as her CT have been reviewed by myself as well as Dr. Dema Severin.  We will plan to proceed with a lap appy once her Covid test has returned.  We also discussed [place] home from PACU if all goes well.  She is very interested in this and had actually asked to be discharged today before I could bring it up.  The procedure including risks and complications  including but not limited to bleeding, infection, post op abscess, bowel/bladder injury, failure of staple line, need for post op drain placement or repeat surgery.  She understands and is  agreeable to proceed.  We did also discuss nonoperatively therapy for which she does not want to proceed with that and proceed with surgery.  Plan for DC home from PACU if all goes well.   Letha Cape, [facility] 06/15/2019, 11:29 AM Please see Amion for pager number during day hours 7:00am-4:30pm or 7:00am -11:30am on weekends

## 2019-06-15 NOTE — Transfer of Care (Signed)
Immediate Anesthesia Transfer of Care Note  Patient: Suzanne [name]  Procedure(s) Performed: APPENDECTOMY LAPAROSCOPIC (N/A Abdomen)  Patient Location: PACU  Anesthesia Type:General  Level of Consciousness: awake, alert , oriented and patient cooperative  Airway & Oxygen Therapy: Patient Spontanous Breathing and Patient connected to face mask oxygen  Post-op Assessment: Report given to RN, Post -op Vital signs reviewed and stable and Patient moving all extremities  Post vital signs: Reviewed and stable  Last Vitals:  Vitals Value Taken Time  BP 124/79 06/15/19 1430  Temp    Pulse 122 06/15/19 1432  Resp 13 06/15/19 1432  SpO2 100 % 06/15/19 1432  Vitals shown include unvalidated device data.  Last Pain:  Vitals:   06/15/19 1232  TempSrc: Oral  PainSc:          Complications: No apparent anesthesia complications

## 2019-06-15 NOTE — Discharge Instructions (Signed)
Pelvic Inflammatory Disease  Pelvic inflammatory disease (PID) is an infection in some or all of the female reproductive organs. PID can be in the womb (uterus), ovaries, fallopian tubes, or nearby tissues that are inside the lower belly area (pelvis). PID can lead to problems if it is not treated. What are the causes?  Germs (bacteria) that are spread during sex. This is the most common cause.  Germs in the vagina that are not spread during sex.  Germs that travel up from the vagina or cervix to the reproductive organs after: ? The birth of a baby. ? A miscarriage. ? An abortion. ? Pelvic surgery. ? Insertion of an intrauterine device (IUD). ? A sexual assault. What increases the risk?  Being younger than 33 years old.  Having sex at a young age.  Having a history of STI (sexually transmitted infection) or PID.  Not using barrier birth control, such as condoms.  Having a lot of sex partners.  Having sex with someone who has symptoms of an STI.  Using a douche.  Having an IUD put in place. What are the signs or symptoms?  Pain in the belly area.  Fever.  Chills.  Discharge from the vagina that is not normal.  Bleeding from the womb that is not normal.  Pain soon after the end of a menstrual period.  Pain when you pee (urinate).  Pain with sex.  Feeling sick to your stomach (nauseous) or throwing up (vomiting). How is this treated?  Antibiotic medicines. In very bad cases, these may be given through an IV tube.  Surgery. This is rare.  Efforts to stop the spread of the infection. Sex partners may need to be treated. It may take weeks until you feel all better. Your doctor may test you for infection again after you finish treatment. You should also be checked for HIV (human immunodeficiency virus). Follow these instructions at home:  Take over-the-counter and prescription medicines only as told by your doctor.  If you were prescribed an antibiotic  medicine, take it as told by your doctor. Do not stop taking it even if you start to feel better.  Do not have sex until treatment is done or as told by your doctor.  Tell your sex partner if you have PID. Your partner may need to be treated.  Keep all follow-up visits as told by your doctor. This is important. Contact a doctor if:  You have more fluid or fluid that is not normal coming from your vagina.  Your pain does not improve.  You throw up.  You have a fever.  You cannot take your medicines.  Your partner has an STI.  You have pain when you pee. Get help right away if:  You have more pain in the belly area.  You have chills.  You are not better in 72 hours with treatment. Summary  Pelvic inflammatory disease (PID) is caused by an infection in some or all of the female reproductive organs.  PID is a serious infection.  This infection is most often treated with antibiotics.  Do not have sex until treatment is done or as told by your doctor. This information is not intended to replace advice given to you by your health care provider. Make sure you discuss any questions you have with your health care provider. Document Revised: 10/03/2017 Document Reviewed: 10/09/2017 Elsevier Patient Education  2020 [employer].  [facility]  Please arrive at least 30 min before  your appointment to complete your check in paperwork.  If you are unable to arrive 30 min prior to your appointment time we may have to cancel or reschedule you. LAPAROSCOPIC SURGERY: POST OP INSTRUCTIONS Always review your discharge instruction sheet given to you by the facility where your surgery was performed. IF YOU HAVE DISABILITY OR FAMILY LEAVE FORMS, YOU MUST BRING THEM TO THE OFFICE FOR PROCESSING.   DO NOT GIVE THEM TO YOUR DOCTOR.  PAIN CONTROL  1. First take acetaminophen (Tylenol) AND/or ibuprofen (Advil) to control your pain after surgery.  Follow directions on  package.  Taking acetaminophen (Tylenol) and/or ibuprofen (Advil) regularly after surgery will help to control your pain and lower the amount of prescription pain medication you may need.  You should not take more than 4,000 mg (4 grams) of acetaminophen (Tylenol) in 24 hours.  You should not take ibuprofen (Advil), aleve, motrin, naprosyn or other NSAIDS if you have a history of stomach ulcers or chronic kidney disease.  2. A prescription for pain medication may be given to you upon discharge.  Take your pain medication as prescribed, if you still have uncontrolled pain after taking acetaminophen (Tylenol) or ibuprofen (Advil). 3. Use ice packs to help control pain. 4. If you need a refill on your pain medication, please contact your pharmacy.  They will contact our office to request authorization. Prescriptions will not be filled after 5pm or on week-ends.  HOME MEDICATIONS 5. Take your usually prescribed medications unless otherwise directed.  DIET 6. You should follow a light diet the first few days after arrival home.  Be sure to include lots of fluids daily. Avoid fatty, fried foods.   CONSTIPATION 7. It is common to experience some constipation after surgery and if you are taking pain medication.  Increasing fluid intake and taking a stool softener (such as Colace) will usually help or prevent this problem from occurring.  A mild laxative (Milk of Magnesia or Miralax) should be taken according to package instructions if there are no bowel movements after 48 hours.  WOUND/INCISION CARE 8. Most patients will experience some swelling and bruising in the area of the incisions.  Ice packs will help.  Swelling and bruising can take several days to resolve.  9. Unless discharge instructions indicate otherwise, follow guidelines below  a. STERI-STRIPS - you may remove your outer bandages 48 hours after surgery, and you may shower at that time.  You have steri-strips (small skin tapes) in place  directly over the incision.  These strips should be left on the skin for 7-10 days.   b. DERMABOND/SKIN GLUE - you may shower in 24 hours.  The glue will flake off over the next 2-3 weeks. 10. Any sutures or staples will be removed at the office during your follow-up visit.  ACTIVITIES 11. You may resume regular (light) daily activities beginning the next day--such as daily self-care, walking, climbing stairs--gradually increasing activities as tolerated.  You may have sexual intercourse when it is comfortable.  Refrain from any heavy lifting or straining until approved by your doctor. a. You may drive when you are no longer taking prescription pain medication, you can comfortably wear a seatbelt, and you can safely maneuver your car and apply brakes.  FOLLOW-UP 12. You should see your doctor in the office for a follow-up appointment approximately 2-3 weeks after your surgery.  You should have been given your post-op/follow-up appointment when your surgery was scheduled.  If you did not receive a post-op/follow-up  appointment, make sure that you call for this appointment within a day or two after you arrive home to insure a convenient appointment time.   WHEN TO CALL YOUR DOCTOR: 1. Fever over 101.0 2. Inability to urinate 3. Continued bleeding from incision. 4. Increased pain, redness, or drainage from the incision. 5. Increasing abdominal pain  The clinic staff is available to answer your questions during regular business hours.  Please dont hesitate to call and ask to speak to one of the nurses for clinical concerns.  If you have a medical emergency, go to the nearest emergency room or call 911.  A surgeon from [facility] is always on call at the hospital. [address], [address], [place], [place]  [address] ? [address] [address], [place], [place]   [address] [phone] ? [phone] ? FAX [phone]     Managing Your Pain After Surgery Without  Opioids    Thank you for participating in our program to help patients manage their pain after surgery without opioids. This is part of our effort to provide you with the best care possible, without exposing you or your family to the risk that opioids pose.  What pain can I expect after surgery? You can expect to have some pain after surgery. This is normal. The pain is typically worse the day after surgery, and quickly begins to get better. Many studies have found that many patients are able to manage their pain after surgery with Over-the-Counter (OTC) medications such as Tylenol and Motrin. If you have a condition that does not allow you to take Tylenol or Motrin, notify your surgical team.  How will I manage my pain? The best strategy for controlling your pain after surgery is around the clock pain control with Tylenol (acetaminophen) and Motrin (ibuprofen or Advil). Alternating these medications with each other allows you to maximize your pain control. In addition to Tylenol and Motrin, you can use heating pads or ice packs on your incisions to help reduce your pain.  How will I alternate your regular strength over-the-counter pain medication? You will take a dose of pain medication every three hours. ; Start by taking 650 mg of Tylenol (2 pills of 325 mg) ; 3 hours later take 600 mg of Motrin (3 pills of 200 mg) ; 3 hours after taking the Motrin take 650 mg of Tylenol ; 3 hours after that take 600 mg of Motrin.   - 1 -  See example - if your first dose of Tylenol is at 12:00 PM   12:00 PM Tylenol 650 mg (2 pills of 325 mg)  3:00 PM Motrin 600 mg (3 pills of 200 mg)  6:00 PM Tylenol 650 mg (2 pills of 325 mg)  9:00 PM Motrin 600 mg (3 pills of 200 mg)  Continue alternating every 3 hours   We recommend that you follow this schedule around-the-clock for at least 3 days after surgery, or until you feel that it is no longer needed. Use the table on the last page of this handout to  keep track of the medications you are taking. Important: Do not take more than 3000mg  of Tylenol or 3200mg  of Motrin in a 24-hour period. Do not take ibuprofen/Motrin if you have a history of bleeding stomach ulcers, severe kidney disease, &/or actively taking a blood thinner  What if I still have pain? If you have pain that is not controlled with the over-the-counter pain medications (Tylenol and Motrin or Advil) you  might have what we call breakthrough pain. You will receive a prescription for a small amount of an opioid pain medication such as Oxycodone, Tramadol, or Tylenol with Codeine. Use these opioid pills in the first 24 hours after surgery if you have breakthrough pain. Do not take more than 1 pill every 4-6 hours.  If you still have uncontrolled pain after using all opioid pills, don't hesitate to call our staff using the number provided. We will help make sure you are managing your pain in the best way possible, and if necessary, we can provide a prescription for additional pain medication.   Day 1    Time  Name of Medication Number of pills taken  Amount of Acetaminophen  Pain Level   Comments  AM PM       AM PM       AM PM       AM PM       AM PM       AM PM       AM PM       AM PM       Total Daily amount of Acetaminophen Do not take more than  3,000 mg per day      Day 2    Time  Name of Medication Number of pills taken  Amount of Acetaminophen  Pain Level   Comments  AM PM       AM PM       AM PM       AM PM       AM PM       AM PM       AM PM       AM PM       Total Daily amount of Acetaminophen Do not take more than  3,000 mg per day      Day 3    Time  Name of Medication Number of pills taken  Amount of Acetaminophen  Pain Level   Comments  AM PM       AM PM       AM PM       AM PM          AM PM       AM PM       AM PM       AM PM       Total Daily amount of Acetaminophen Do not take more than  3,000 mg per day      Day  4    Time  Name of Medication Number of pills taken  Amount of Acetaminophen  Pain Level   Comments  AM PM       AM PM       AM PM       AM PM       AM PM       AM PM       AM PM       AM PM       Total Daily amount of Acetaminophen Do not take more than  3,000 mg per day      Day 5    Time  Name of Medication Number of pills taken  Amount of Acetaminophen  Pain Level   Comments  AM PM       AM PM       AM PM       AM PM  AM PM       AM PM       AM PM       AM PM       Total Daily amount of Acetaminophen Do not take more than  3,000 mg per day       Day 6    Time  Name of Medication Number of pills taken  Amount of Acetaminophen  Pain Level  Comments  AM PM       AM PM       AM PM       AM PM       AM PM       AM PM       AM PM       AM PM       Total Daily amount of Acetaminophen Do not take more than  3,000 mg per day      Day 7    Time  Name of Medication Number of pills taken  Amount of Acetaminophen  Pain Level   Comments  AM PM       AM PM       AM PM       AM PM       AM PM       AM PM       AM PM       AM PM       Total Daily amount of Acetaminophen Do not take more than  3,000 mg per day        For additional information about how and where to safely dispose of unused opioid medications - [employer]https://www.morepowerful[employer].org  Disclaimer: This document contains information and/or instructional materials adapted from [facility] for the typical patient with your condition. It does not replace medical advice from your health care provider because your experience may differ from that of the typical patient. Talk to your health care provider if you have any questions about this document, your condition or your treatment plan. Adapted from [facility]

## 2019-06-15 NOTE — ED Provider Notes (Signed)
[facility] DEPT Provider Note   CSN: [id number] Arrival date & time: 06/15/19  2458     History Chief Complaint  Patient presents with  . Flank Pain  . Abdominal Pain    Suzanne [name] is a 34 y.o. female with past medical history of alcohol abuse, presenting to the ED with complaint of gradual onset of right lower quadrant abdominal pain that began about 2 days ago.  Pain has been gradually worsening, is worse with any movement or palpation.  Pain radiates to her right back as well.  She has not treated her symptoms with any medications.  She has some increased urinary frequency with nausea and decreased appetite.  Denies diarrhea.  She states she may have some abnormal vaginal discharge so is unsure.  She is sexually active with female partners with protection.  No history of abdominal surgeries or known ovarian cysts.  The history is provided by the patient.       Past Medical History:  Diagnosis Date  . Bacterial infection   . Brain cyst   . Depression   . Dysplasia of cervix, low grade (CIN 1)   . ETOH abuse   . History of chicken pox   . HPV (human papilloma virus) anogenital infection   . LGSIL (low grade squamous intraepithelial dysplasia)   . UTI (urinary tract infection)   . Vaginal trichomoniasis     Patient Active Problem List   Diagnosis Date Noted  . Depression, major, recurrent, moderate ([place]) 07/08/2014  . Alcohol use disorder, severe, dependence ([place]) 07/07/2014    Past Surgical History:  Procedure Laterality Date  . SKIN GRAFT       OB History    Gravida  2   Para  1   Term      Preterm      AB      Living        SAB      TAB      Ectopic      Multiple      Live Births              Family History  Problem Relation Age of Onset  . Diabetes Mother   . Cancer Mother     Social History   Tobacco Use  . Smoking status: Current Every Day Smoker    Packs/day: 0.50    Types: Cigarettes    . Smokeless tobacco: Never Used  Substance Use Topics  . Alcohol use: Yes    Comment: 10-12 airplane bottles/day or every other day.  last drink 1 week ago  . Drug use: Not Currently    Types: Marijuana    Home Medications Prior to Admission medications   Medication Sig Start Date End Date Taking? Authorizing Provider  acamprosate (CAMPRAL) 333 MG tablet Take 666 mg by mouth 3 (three) times daily with meals.   Yes [provider]  Aspirin-Acetaminophen-Caffeine (GOODY HEADACHE PO) Take 1 packet by mouth as needed (for pain or headaches).    Yes [provider]  disulfiram (ANTABUSE) 250 MG tablet Take 250 mg by mouth daily.   Yes [provider]  escitalopram (LEXAPRO) 10 MG tablet Take 10 mg by mouth daily.   Yes [provider]    Allergies    Patient has no known allergies.  Review of Systems   Review of Systems  Constitutional: Negative for fever.  Gastrointestinal: Positive for abdominal pain and nausea. Negative for diarrhea  and vomiting.  Genitourinary: Positive for frequency. Negative for dysuria.  Musculoskeletal: Positive for back pain.  All other systems reviewed and are negative.   Physical Exam Updated Vital Signs BP 117/79 (BP Location: Left Arm)   Pulse (!) 119   Temp 99.5 F (37.5 C) (Oral)   Resp 16   Ht 5\' 1"  (1.549 m)   Wt 50.3 kg   LMP 06/08/2019 Comment: negative urine pregnancy test 06-15-2019  SpO2 97%   BMI 20.97 kg/m   Physical Exam Vitals and nursing note reviewed.  Constitutional:      Appearance: She is well-developed.     Comments: Pt appears very uncomfortable  HENT:     Head: Normocephalic and atraumatic.  Eyes:     Conjunctiva/sclera: Conjunctivae normal.  Cardiovascular:     Rate and Rhythm: Normal rate and regular rhythm.  Pulmonary:     Effort: Pulmonary effort is normal. No respiratory distress.     Breath sounds: Normal breath sounds.  Abdominal:     General: Bowel sounds are normal.      Palpations: Abdomen is soft.     Tenderness: There is abdominal tenderness in the right lower quadrant. There is guarding and rebound. Positive signs include Rovsing's sign and McBurney's sign.     Comments: Palpation of all quadrants of the belly causes pain in the RLQ  Skin:    General: Skin is warm.  Neurological:     Mental Status: She is alert.  Psychiatric:        Behavior: Behavior normal.     ED Results / Procedures / Treatments   Labs (all labs ordered are listed, but only abnormal results are displayed) Labs Reviewed  COMPREHENSIVE METABOLIC PANEL - Abnormal; Notable for the following components:      Result Value   Glucose, Bld 141 (*)    Calcium 8.8 (*)    ALT 47 (*)    All other components within normal limits  CBC WITH DIFFERENTIAL/PLATELET - Abnormal; Notable for the following components:   WBC 10.7 (*)    RBC 3.49 (*)    MCV 105.4 (*)    MCH 35.8 (*)    Neutro Abs 9.0 (*)    All other components within normal limits  SARS CORONAVIRUS 2 BY RT PCR (HOSPITAL ORDER, PERFORMED IN  HOSPITAL LAB)  URINALYSIS, ROUTINE W REFLEX MICROSCOPIC  PREGNANCY, URINE    EKG None  Radiology CT Abdomen Pelvis W Contrast  Result Date: 06/15/2019 CLINICAL DATA:  Abdominal pain, primarily right-sided EXAM: CT ABDOMEN AND PELVIS WITH CONTRAST TECHNIQUE: Multidetector CT imaging of the abdomen and pelvis was performed using the standard protocol following bolus administration of intravenous contrast. CONTRAST:  06/17/2019 OMNIPAQUE IOHEXOL 300 MG/ML  SOLN COMPARISON:  None. FINDINGS: Lower chest: Lung bases are clear. Hepatobiliary: No focal liver lesions are evident. Gallbladder wall is not appreciably thickened. There is no biliary duct dilatation. Pancreas: There is no pancreatic mass or inflammatory focus. Spleen: No splenic lesions are evident. Adrenals/Urinary Tract: Adrenals bilaterally appear normal. Kidneys bilaterally show no evident mass or hydronephrosis on either  side. There is no appreciable renal or ureteral calculus on either side. Note that foci of contrast within the renal collecting systems potentially could mask small calculi. Urinary bladder is midline with wall thickness within normal limits. Stomach/Bowel: There is no appreciable bowel wall or mesenteric thickening. There is moderate stool in the colon. No bowel obstruction evident. The terminal ileum appears within normal limits. There is no evident  free air or portal venous air. Vascular/Lymphatic: There is no abdominal aortic aneurysm. No arterial vascular lesions are evident. Major venous structures appear patent. There is no adenopathy in the abdomen or pelvis. Reproductive: Uterus is anteverted. There is a somewhat complex cystic lesion in the right adnexal region measuring 3.7 x 3.4 cm. No other pelvic mass demonstrable. There is a small amount of fluid in the cul-de-sac region. Other: The appendix is dilated with a maximum diameter of 1.3 cm. The wall of the appendix appears rather ill-defined. There is no associated fluid or abscess. No perforation in this area. There is no ascites beyond mild fluid in the cul-de-sac region. No abscess evident in the abdomen or pelvis. Musculoskeletal: There is degenerative change in the lower thoracic and lumbar regions. There is no abdominal wall or intramuscular lesions. IMPRESSION: 1. Findings felt to be indicative of acute appendiceal inflammation. Appendix: Location: Appendix is located in the right mid to lower pelvis arising inferiorly from the cecum. Diameter: Up to 13 mm Appendicolith: None Mucosal hyper-enhancement: None. Appendiceal wall appears rather ill-defined. Extraluminal gas: None Periappendiceal collection: None 2.  No bowel obstruction.  No abscess in the abdomen or pelvis. 3. Somewhat complex septated right cystic adnexal mass measuring 3.7 x 3.4 cm. Statistically most likely hemorrhagic cyst. Pelvic ultrasound correlation advise nonemergently, after  treatment for acute appendicitis. 4. Mild fluid in the cul-de-sac which appears slightly loculated. Question recent ovarian cyst rupture/leakage versus reactive fluid from appendicitis. 5. No renal or ureteral calculus. No hydronephrosis. Urinary bladder wall thickness within normal limits. Critical Value/emergent results were called by telephone at the time of interpretation on 06/15/2019 at 9:51 am to provider [place] Jaquon Gingerich , who verbally acknowledged these results. Electronically Signed   By: Bretta Bang III M.D.   On: 06/15/2019 09:51    Procedures Procedures (including critical care time)  Medications Ordered in ED Medications  sodium chloride (PF) 0.9 % injection (has no administration in time range)  dextrose 5 % and 0.45 % NaCl with KCl 10 mEq/L infusion (has no administration in time range)  lactated ringers infusion ( Intravenous New Bag/Given 06/15/19 1232)  ondansetron (ZOFRAN) injection 4 mg (4 mg Intravenous Given 06/15/19 0755)  sodium chloride 0.9 % bolus 1,000 mL (0 mLs Intravenous Stopped 06/15/19 1020)  HYDROmorphone (DILAUDID) injection 0.5 mg (0.5 mg Intravenous Given 06/15/19 0754)  iohexol (OMNIPAQUE) 300 MG/ML solution 100 mL (100 mLs Intravenous Contrast Given 06/15/19 0925)  cefTRIAXone (ROCEPHIN) 2 g in sodium chloride 0.9 % 100 mL IVPB (0 g Intravenous Stopping Infusion hung by another clincian 06/15/19 1228)    And  metroNIDAZOLE (FLAGYL) IVPB 500 mg (500 mg Intravenous New Bag/Given 06/15/19 1129)  HYDROmorphone (DILAUDID) injection 1 mg (1 mg Intravenous Given 06/15/19 1045)  HYDROmorphone (DILAUDID) injection 1 mg (1 mg Intravenous Given 06/15/19 1128)  acetaminophen (TYLENOL) tablet 1,000 mg (1,000 mg Oral Given 06/15/19 1229)  celecoxib (CELEBREX) capsule 200 mg (200 mg Oral Given 06/15/19 1229)    ED Course  I have reviewed the triage vital signs and the nursing notes.  Pertinent labs & imaging results that were available during my care of the patient were  reviewed by me and considered in my medical decision making (see chart for details).  Clinical Course as of Jun 14 1245  Mon Jun 15, 2019  7858 Acute appendicitis per radiology. Will order abx and consult surgery.   [JR]    Clinical Course User Index [JR] Buffey Zabinski, [place] N, PA-C   MDM Rules/Calculators/A&P  Patient presenting with gradual onset of right lower quadrant abdominal pain that began 2 days ago with associated decreased appetite and nausea.  She also reports some increased urinary frequency and questionable vaginal discharge.  No history of abdominal surgeries.  On exam, patient has pain in the right lower quadrant with palpation of all quadrants with some guarding and rebound.  Abdomen is soft.  Urine pregnancy and UA obtained in triage are negative.  Concern for possible acute appendicitis, less likely urinary tract etiology due to negative urine.  Also concern for pelvic etiology.  Will obtain labs and CT scan to rule out appendicitis.  If negative, will proceed with pelvic exam.  Lower suspicion for ovarian torsion given gradual onset of symptoms 2 days ago.   CBC reveals mild leukocytosis of 10.7 with left shift.  Labs are otherwise unremarkable.  Patient symptoms improved with medications.  CT scan consistent with acute appendicitis, without perforation or abscess.  There is also findings of possible recently ruptured hemorrhagic ovarian cyst.  This incidental finding was discussed with patient.  IV antibiotics administered.  Surgery consulted, patient to the OR for emergent appendectomy.  Final Clinical Impression(s) / ED Diagnoses Final diagnoses:  Acute appendicitis with generalized peritonitis, without gangrene or abscess, unspecified whether perforation present  Right ovarian cyst    Rx / DC Orders ED Discharge Orders    None       Lavert Matousek, [place] N, PA-C 06/15/19 1247    Arby Barrette, MD 06/16/19 [phone]

## 2019-06-15 NOTE — Op Note (Signed)
Suzanne [name] [id number]   PRE-OPERATIVE DIAGNOSIS:  Acute appendicitis  POST-OPERATIVE DIAGNOSIS: Thickened appendix possible secondary inflammation; inflamed right fallopian tube/ovary  Procedure(s): APPENDECTOMY LAPAROSCOPIC  PROCEDURE: Laparoscopic appendectomy  SURGEON:  Suzanne [name], M.D.  ANESTHESIA: General endotracheal  EBL:   25 mL  DRAINS: None  SPECIMEN:  Appendix  COUNTS:  Sponge, needle and instrument counts were reported correct x2 at conclusion of the operation  DISPOSITION:  PACU in satisfactory condition  COMPLICATIONS: None  FINDINGS: Purulent fluid in pelvis; enlarged/inflamed appearing right fallopian tube and ovary with fibrinous exudate on this structure. Appendix thickened but not significantly inflammed appendicitis - this was partially retrocecal but in contact with the pelvic process as well. No large intraperitoneal abscess encountered but clearly pus and purulent fluid in pelvis. Sigmoid and intraperitoneal rectum normal in appearance. Photos of fallopian tube/uterus/ovary taken:     INDICATIONS: Suzanne [name] is a pleasant 52yoF who presented to the emergency department earlier today with a 1 to 2-day history of acute onset right lower quadrant pain.  This was sharp and nonradiating.  It was persistent and associated with nausea and chills.  She exhibited right lower quadrant tenderness.  She underwent evaluation emergency department was found to have a mild leukocytosis with a Janard Culp count of 10.7.  She underwent CT scan of the abdomen pelvis that demonstrated a dilated appendix with a maximum diameter of 1.3 cm with a rather ill-defined appearing wall.  No abscess or evident perforation.  Mild amount of fluid in the cul-de-sac.  Findings were felt to be most consistent with acute appendiceal inflammation.  She was also found to have a 3.7 x 3.4 cm septated cystic adnexal mass complex.  We reviewed everything with her and discussed options  moving forward. She elected to proceed with surgery. Please refer to notes elsewhere in the EMR for details regarding this discussion.  DESCRIPTION:   The patient was identified & brought into the operating room. SCDs were in place and functioning. General endotracheal anesthesia was administered. Preoperative antibiotics were administered. The patient was positioned supine with left arm tucked. Hair on the abdomen was then clipped by the OR team. A foley catheter was inserted under sterile conditions. The abdomen was prepped and draped in the standard sterile fashion. A surgical timeout confirmed our plan.  A small incision was made in the infraumbilical skin. The subcutaneous tissue was dissected and the umbilical stalk identified. The stalk was grasped with a Kocher and retracted outwardly. The infraumbilical fascia was exposed and incised. Peritoneal entry was carefully made bluntly. A 0 Vicryl purse-string suture was placed and then the [facility] port was introduced into the abdomen.  CO2 insufflation commenced to 3mmHg. The laparoscope was inserted and confirmed no evidence of trocar site complications. The patient was then positioned in Trendelenburg. Two additional ports were placed - one in left lower quadrant and another in the suprapubic midline taking care to stay well above the bladder - 3 fingerbreadths above the pubic symphysis. The bed was then slightly tilted to place the left side down.  The terminal ileum was identified. The ileum was swept out of the pelvis.  Omentum was reflected cephalad.  There was purulent and pus fluid in the pelvis that was evacuated with the suction irrigator.  The right tube and ovary appeared inflamed with fibrinous exudate coating them.  The appendix was able to be identified in the right lower quadrant in a partially retrocecal position.  This appeared to be also mildly inflamed  and could be secondary to the process going on in the pelvis.  This was certainly  smaller than 1.3 cm though.  Pictures were then taken of the right fallopian tube and ovary.  The appendix was identified and attachments to the appendix to the surrounding tissues were freed without difficulty.  The appendix was elevated.  The base of the appendix was circumferentially dissected taking care to preserve the cecum free of injury. The base was noted to be viable and healthy appearing. The terminal ileum, cecum and ascending colon also appeared normal. The base of the appendix was then stapled with a blue load, taking this flush with the cecum, taking care to stay clear of the ileocecal valve. The mesoappendix was then ligated by "hugging" the appendix using the harmonic scalpel. The mesoappendix was inspected and noted to be hemostatic. A small staple line oozing vessel was then controlled with a 10 mm clip. This was then irrigated and hemostatic. The appendix was placed in an EndoBag.  The right lower quadrant was conservatively irrigated. Hemostasis was noted to be achieved - taking time to inspect the ligated mesoappendix, colon mesentery, and retroperitoneum. Staple line was noted to be intact on the cecum with no bleeding. There was no perforation or injury. The right lower quadrant appeared clean and as such, no drain was placed.  The left lower quadrant and suprapubic ports were removed under direct visualization. The EndoBag was then removed through the umbilical port site and passed off as specimen. The CO2 was exhausted from the abdomen. The umbilical fascia was then closed by closing the 0 Vicryl suture. The fascia was palpated and noted to be completely closed. The skin of all port sites was then approximated using 4-0 Monocryl suture. The incisions were covered with Dermabond.  She was then awakened from general anesthesia, extubated, and transferred to a stretcher for transport to recover in satisfactory condition.   I spoke over the phone with Dr. Vergie Living from gynecology  regarding this constellation of findings and potential for pelvic inflammatory disease. We will tentatively plan for her to be admitted postoperatively overnight for IV Zosyn and if remains afebrile, possible discharge tomorrow on antibiotics as he had recommended - Doxycycline 100 mg BID x 7 days and flagyl 500 mg BID x 7 days and will need close gynecology follow-up. We will touch base with gynecology prior to discharge to arrange follow-up as well.

## 2019-06-15 NOTE — ED Triage Notes (Signed)
Pt reports right sided flank pain for the last 2 days with sharp pain.

## 2019-06-15 NOTE — ED Notes (Signed)
Pt transported to OR at this time  

## 2019-06-15 NOTE — Anesthesia Preprocedure Evaluation (Signed)
Anesthesia Evaluation  Patient identified by MRN, date of birth, ID band Patient awake    Reviewed: Allergy & Precautions, NPO status , Patient's Chart, lab work & pertinent test results  Airway Mallampati: III  TM Distance: >3 FB Neck ROM: Full  Mouth opening: Limited Mouth Opening  Dental  (+) Dental Advisory Given   Pulmonary Current Smoker,    breath sounds clear to auscultation       Cardiovascular negative cardio ROS   Rhythm:Regular Rate:Normal     Neuro/Psych negative neurological ROS     GI/Hepatic Neg liver ROS, Acute appendicitis   Endo/Other  negative endocrine ROS  Renal/GU negative Renal ROS     Musculoskeletal   Abdominal   Peds  Hematology negative hematology ROS (+)   Anesthesia Other Findings   Reproductive/Obstetrics                             Anesthesia Physical Anesthesia Plan  ASA: II and emergent  Anesthesia Plan: General   Post-op Pain Management:    Induction: Intravenous  PONV Risk Score and Plan: 2 and Dexamethasone, Ondansetron and Treatment may vary due to age or medical condition  Airway Management Planned: Oral ETT  Additional Equipment: None  Intra-op Plan:   Post-operative Plan: Extubation in OR  Informed Consent: I have reviewed the patients History and Physical, chart, labs and discussed the procedure including the risks, benefits and alternatives for the proposed anesthesia with the patient or authorized representative who has indicated his/her understanding and acceptance.     Dental advisory given  Plan Discussed with: CRNA  Anesthesia Plan Comments:         Anesthesia Quick Evaluation

## 2019-06-15 NOTE — Anesthesia Procedure Notes (Addendum)
Procedure Name: Intubation Date/Time: 06/15/2019 12:55 PM Performed by: Suzette Battiest, MD Pre-anesthesia Checklist: Patient identified, Emergency Drugs available, Suction available, Patient being monitored and Timeout performed Patient Re-evaluated:Patient Re-evaluated prior to induction Oxygen Delivery Method: Circle system utilized Preoxygenation: Pre-oxygenation with 100% oxygen Induction Type: IV induction, Rapid sequence and Cricoid Pressure applied Laryngoscope Size: Mac and 3 Grade View: Grade III Tube type: Oral Tube size: 7.0 mm Number of attempts: 2 Airway Equipment and Method: Bougie stylet Placement Confirmation: ETT inserted through vocal cords under direct vision,  positive ETCO2 and breath sounds checked- equal and bilateral Secured at: 21 cm Tube secured with: Tape Dental Injury: Teeth and Oropharynx as per pre-operative assessment  Comments: PreO2. Smooth RSI with cricoid pressure by Dr Ola Spurr. DL x 1 with Mac 3 by CRNA. Grade 2-3 view. ETT passed. - ETCO. ETT removed. DL X 1 with Mil 2. Grade 2-3 view by Dr Ola Spurr. + ETCO2 BBS+ ATOI. ETT secured at 21 cm at the lip.

## 2019-06-16 ENCOUNTER — Encounter: Payer: Self-pay | Admitting: *Deleted

## 2019-06-16 MED ORDER — OXYCODONE HCL 5 MG PO TABS: 5.0000 mg | ORAL_TABLET | ORAL | 0 refills | Status: DC | PRN

## 2019-06-16 MED ORDER — ACETAMINOPHEN 500 MG PO TABS: 1000.0000 mg | ORAL_TABLET | Freq: Four times a day (QID) | ORAL | Status: DC | PRN

## 2019-06-16 NOTE — Anesthesia Postprocedure Evaluation (Signed)
Anesthesia Post Note  Patient: Suzanne [name]  Procedure(s) Performed: APPENDECTOMY LAPAROSCOPIC (N/A Abdomen)     Patient location during evaluation: PACU Anesthesia Type: General Level of consciousness: awake and alert Pain management: pain level controlled Vital Signs Assessment: post-procedure vital signs reviewed and stable Respiratory status: spontaneous breathing, nonlabored ventilation, respiratory function stable and patient connected to nasal cannula oxygen Cardiovascular status: blood pressure returned to baseline and stable Postop Assessment: no apparent nausea or vomiting Anesthetic complications: no    Last Vitals:  Vitals:   06/16/19 0526 06/16/19 0914  BP: 123/81 113/76  Pulse: 69 76  Resp: 16 18  Temp: 36.4 C (!) 36.4 C  SpO2: 100% 100%    Last Pain:  Vitals:   06/16/19 0918  TempSrc:   PainSc: 5                  Kennieth Rad

## 2019-06-16 NOTE — Plan of Care (Signed)

## 2019-06-16 NOTE — Discharge Summary (Signed)
[facility] Discharge Summary   Patient ID: Suzanne [name] MRN: [id number] DOB/AGE: [birth year] 34 y.o.  Admit date: 06/15/2019 Discharge date: 06/16/2019  Admitting Diagnosis: Acute appendicitis  Discharge Diagnosis Patient Active Problem List   Diagnosis Date Noted  . PID (acute pelvic inflammatory disease) 06/15/2019  . Depression, major, recurrent, moderate (HCC) 07/08/2014  . Alcohol use disorder, severe, dependence (HCC) 07/07/2014    Consultants None   Imaging: CT Abdomen Pelvis W Contrast  Result Date: 06/15/2019 CLINICAL DATA:  Abdominal pain, primarily right-sided EXAM: CT ABDOMEN AND PELVIS WITH CONTRAST TECHNIQUE: Multidetector CT imaging of the abdomen and pelvis was performed using the standard protocol following bolus administration of intravenous contrast. CONTRAST:  OMNIPAQUE IOHEXOL 300 MG/ML  SOLN COMPARISON:  None. FINDINGS: Lower chest: Lung bases are clear. Hepatobiliary: No focal liver lesions are evident. Gallbladder wall is not appreciably thickened. There is no biliary duct dilatation. Pancreas: There is no pancreatic mass or inflammatory focus. Spleen: No splenic lesions are evident. Adrenals/Urinary Tract: Adrenals bilaterally appear normal. Kidneys bilaterally show no evident mass or hydronephrosis on either side. There is no appreciable renal or ureteral calculus on either side. Note that foci of contrast within the renal collecting systems potentially could mask small calculi. Urinary bladder is midline with wall thickness within normal limits. Stomach/Bowel: There is no appreciable bowel wall or mesenteric thickening. There is moderate stool in the colon. No bowel obstruction evident. The terminal ileum appears within normal limits. There is no evident free air or portal venous air. Vascular/Lymphatic: There is no abdominal aortic aneurysm. No arterial vascular lesions are evident. Major venous structures appear patent. There is no  adenopathy in the abdomen or pelvis. Reproductive: Uterus is anteverted. There is a somewhat complex cystic lesion in the right adnexal region measuring 3.7 x 3.4 cm. No other pelvic mass demonstrable. There is a small amount of fluid in the cul-de-sac region. Other: The appendix is dilated with a maximum diameter of 1.3 cm. The wall of the appendix appears rather ill-defined. There is no associated fluid or abscess. No perforation in this area. There is no ascites beyond mild fluid in the cul-de-sac region. No abscess evident in the abdomen or pelvis. Musculoskeletal: There is degenerative change in the lower thoracic and lumbar regions. There is no abdominal wall or intramuscular lesions. IMPRESSION: 1. Findings felt to be indicative of acute appendiceal inflammation. Appendix: Location: Appendix is located in the right mid to lower pelvis arising inferiorly from the cecum. Diameter: Up to 13 mm Appendicolith: None Mucosal hyper-enhancement: None. Appendiceal wall appears rather ill-defined. Extraluminal gas: None Periappendiceal collection: None 2.  No bowel obstruction.  No abscess in the abdomen or pelvis. 3. Somewhat complex septated right cystic adnexal mass measuring 3.7 x 3.4 cm. Statistically most likely hemorrhagic cyst. Pelvic ultrasound correlation advise nonemergently, after treatment for acute appendicitis. 4. Mild fluid in the cul-de-sac which appears slightly loculated. Question recent ovarian cyst rupture/leakage versus reactive fluid from appendicitis. 5. No renal or ureteral calculus. No hydronephrosis. Urinary bladder wall thickness within normal limits. Critical Value/emergent results were called by telephone at the time of interpretation on 06/15/2019 at 9:51 am to provider [place] ROBINSON , who verbally acknowledged these results. Electronically Signed   By: Bretta Bang III M.D.   On: 06/15/2019 09:51    Procedures Dr. Cliffton Asters (06/15/19) - Laparoscopic Appendectomy  Hospital Course:   Patient is a 34 year old female who presented to [facility] with RLQ abdominal pain.  Workup showed possible  appendicitis and right ovarian cyst.  Patient was admitted and underwent procedure listed above. Intra-operatively structure that appeared to be dilated appendix on CT turned out to be inflamed fallopian tube. Appendix removed Tolerated procedure well and was transferred to the floor.  Diet was advanced as tolerated.  On POD#1, the patient was voiding well, tolerating diet, ambulating well, pain well controlled, vital signs stable, incisions c/d/i and felt stable for discharge home.  Patient will follow up in our office in 3-4 weeks and knows to call with questions or concerns.  She will call to confirm appointment date/time.    Physical Exam: General:  Alert, NAD, pleasant, comfortable Abd:  Soft, ND, mild tenderness, incisions C/D/I   Allergies as of 06/16/2019   No Known Allergies     Medication List    TAKE these medications   acamprosate 333 MG tablet Commonly known as: CAMPRAL Take 666 mg by mouth 3 (three) times daily with meals.   acetaminophen 500 MG tablet Commonly known as: TYLENOL Take 2 tablets (1,000 mg total) by mouth every 6 (six) hours as needed for mild pain or fever.   disulfiram 250 MG tablet Commonly known as: ANTABUSE Take 250 mg by mouth daily.   doxycycline 50 MG capsule Commonly known as: VIBRAMYCIN Take 2 capsules (100 mg total) by mouth 2 (two) times daily for 7 days.   escitalopram 10 MG tablet Commonly known as: LEXAPRO Take 10 mg by mouth daily.   GOODY HEADACHE PO Take 1 packet by mouth as needed (for pain or headaches).   metroNIDAZOLE 500 MG tablet Commonly known as: Flagyl Take 1 tablet (500 mg total) by mouth 3 (three) times daily for 7 days.   oxyCODONE 5 MG immediate release tablet Commonly known as: Oxy IR/ROXICODONE Take 1 tablet (5 mg total) by mouth every 4 (four) hours as needed for moderate pain.        Follow-up Information     [facility] Follow up on 07/07/2019.   Specialty: General Surgery Why: 9:15am, arrive by 8:45am for paperwork and check in process Contact information: [address] [address] [phone]        [facility] Call.   Why: Call and schedule a follow up in 1-2 weeks for pelvic inflammatory disease Contact information: [place] [address] [phone]           Signed: Brigid Re, [facility] 06/16/2019, 10:31 AM Please see Amion for pager number during day hours 7:00am-4:30pm

## 2019-06-16 NOTE — Progress Notes (Signed)
Nutrition Brief Note  Patient identified on the Malnutrition Screening Tool (MST) Report  Wt Readings from Last 15 Encounters:  06/15/19 50.3 kg  01/13/19 50.7 kg  08/09/18 49.9 kg  05/14/18 49.9 kg  11/28/17 52.2 kg  03/15/17 54.4 kg  09/15/16 52.2 kg  10/10/15 55.3 kg  06/16/15 54.4 kg  06/14/15 54.4 kg  02/03/15 52.2 kg  06/10/14 54.4 kg  08/24/13 56.7 kg  10/30/11 63 kg  09/10/11 58.1 kg    Body mass index is 20.95 kg/m. Patient meets criteria for normal weight based on current BMI. Current weight is 111 lb, weight on 01/13/19 was 111 lb, and weight on 05/14/18 was 110 lb.  Patient was admitted to [facility] yesterday due to RLQ abdominal pain which was sharp, persistent, and non-radiating. She was diagnosed with acute pelvic inflammatory disease/appendicitis and underwent lap appy on 5/17.  Discharge order and discharge summary for discharge home entered earlier this AM.   Current diet order is FLD. Labs and medications reviewed.   No nutrition interventions warranted at this time. If nutrition issues arise, please consult RD.     Trenton Gammon, MS, RD, LDN, CNSC Inpatient Clinical Dietitian RD pager # available in AMION  After hours/weekend pager # available in [facility]

## 2019-06-17 LAB — SURGICAL PATHOLOGY

## 2020-08-02 ENCOUNTER — Emergency Department (HOSPITAL_BASED_OUTPATIENT_CLINIC_OR_DEPARTMENT_OTHER): Payer: Medicaid Other

## 2020-08-02 ENCOUNTER — Emergency Department (HOSPITAL_BASED_OUTPATIENT_CLINIC_OR_DEPARTMENT_OTHER)
Admission: EM | Admit: 2020-08-02 | Discharge: 2020-08-02 | Disposition: A | Discharge status: Home / Self Care | Payer: Medicaid Other | Attending: Emergency Medicine | Admitting: Emergency Medicine

## 2020-08-02 ENCOUNTER — Encounter (HOSPITAL_BASED_OUTPATIENT_CLINIC_OR_DEPARTMENT_OTHER): Payer: Self-pay | Admitting: *Deleted

## 2020-08-02 ENCOUNTER — Ambulatory Visit: Payer: Self-pay

## 2020-08-02 DIAGNOSIS — M5441 Lumbago with sciatica, right side: Secondary | ICD-10-CM | POA: Diagnosis not present

## 2020-08-02 DIAGNOSIS — M25551 Pain in right hip: Secondary | ICD-10-CM | POA: Diagnosis present

## 2020-08-02 DIAGNOSIS — F17210 Nicotine dependence, cigarettes, uncomplicated: Secondary | ICD-10-CM | POA: Insufficient documentation

## 2020-08-02 LAB — PREGNANCY, URINE: Preg Test, Ur: NEGATIVE

## 2020-08-02 MED ORDER — NAPROXEN 500 MG PO TABS
500.0000 mg | ORAL_TABLET | Freq: Two times a day (BID) | ORAL | 0 refills | Status: DC
Start: 2020-08-02 — End: 2021-03-16

## 2020-08-02 MED ORDER — OXYCODONE-ACETAMINOPHEN 5-325 MG PO TABS
1.0000 | ORAL_TABLET | Freq: Once | ORAL | Status: AC
  Administered 2020-08-02: 1 via ORAL
  Filled 2020-08-02: qty 1

## 2020-08-02 MED ORDER — PREDNISONE 10 MG (21) PO TBPK: ORAL_TABLET | Freq: Every day | ORAL | 0 refills | Status: DC

## 2020-08-02 NOTE — Telephone Encounter (Signed)
Pt stated she has had low back pain to right leg for 2 weeks. Pt crying and hyperventilating c/o sudden onset numbness from right hip. Pt stated pain is 10/10 described as burning and stabbing pain.  Denies numbness to face, arms or left leg. Speech is clear. During call pt began to feel lightheaded "faint". Advised pt to sit down and put her head between her legs. Advised pt to slow her breathing down and take slower deep breaths.  Advised pt she needs an ambulance. She was sitting outside waiting on her sister to get off of work at 1630. Pt did not have a key to the house.  Pt agreed for me to call 911. Called 911 gave address and warm transferred call to pt. Stayed connected and EMS ok with my request to disconnect call.   Reason for Disposition  [1] Numbness (i.e., loss of sensation) of the face, arm / hand, or leg / foot on one side of the body AND [2] sudden onset AND [3] present now  Answer Assessment - Initial Assessment Questions 1. SYMPTOM: "What is the main symptom you are concerned about?" (e.g., weakness, numbness)     Numbness RIGHT leg from hip to foot 2. ONSET: "When did this start?" (minutes, hours, days; while sleeping)     today 3. LAST NORMAL: "When was the last time you (the patient) were normal (no symptoms)?"     3 weeks ago 4. PATTERN "Does this come and go, or has it been constant since it started?"  "Is it present now?"     constant 5. CARDIAC SYMPTOMS: "Have you had any of the following symptoms: chest pain, difficulty breathing, palpitations?"     Difficulty breathing (pt very scared and upset during call ) 6. NEUROLOGIC SYMPTOMS: "Have you had any of the following symptoms: headache, dizziness, vision loss, double vision, changes in speech, unsteady on your feet?"     Yes due to numbness 7. OTHER SYMPTOMS: "Do you have any other symptoms?"     2 weeks of RIGHT leg pain  Protocols used: Neurologic Deficit-A-AH

## 2020-08-02 NOTE — ED Provider Notes (Addendum)
[facility] EMERGENCY DEPARTMENT Provider Note   CSN: [id number] Arrival date & time: 08/02/20  1650     History No chief complaint on file.   Suzanne [name] is a 35 y.o. female with past medical history as outlined below.  Patient presents emergency department with a chief complaint of right hip pain.  Patient states that pain has been present over the last 4 months.  Pain has become worse over the last 2 weeks.  Patient states pain is constant.  Rates pain 10/10 on the pain scale.  Pain is described as sharp.  Pain is worse with weightbearing, touch, and movement.  Patient has had no improvement with lidocaine patches, Tylenol, or ibuprofen.  Patient denies any falls or injuries.  Patient states that she started a new job today was on her feet more, at the end of her shift pain was unbearable and she sought treatment in the emergency department.  Patient endorses decreased sensation and weakness to right lower extremity.  Patient denies any bowel or bladder dysfunction, saddle anesthesia, fever, chills, wound, rash, visual disturbance, facial asymmetry, slurred speech.  Patient denies any history of cancer or IV drug use.  HPI     Past Medical History:  Diagnosis Date   Bacterial infection    Brain cyst    Depression    Dysplasia of cervix, low grade (CIN 1)    ETOH abuse    History of chicken pox    HPV (human papilloma virus) anogenital infection    LGSIL (low grade squamous intraepithelial dysplasia)    UTI (urinary tract infection)    Vaginal trichomoniasis     Patient Active Problem List   Diagnosis Date Noted   PID (acute pelvic inflammatory disease) 06/15/2019   Depression, major, recurrent, moderate (HCC) 07/08/2014   Alcohol use disorder, severe, dependence (HCC) 07/07/2014    Past Surgical History:  Procedure Laterality Date   LAPAROSCOPIC APPENDECTOMY N/A 06/15/2019   Procedure: APPENDECTOMY LAPAROSCOPIC;  Surgeon: Andria Meuse, MD;   Location: [facility] ORS;  Service: General;  Laterality: N/A;   SKIN GRAFT       OB History     Gravida  2   Para  1   Term      Preterm      AB      Living         SAB      IAB      Ectopic      Multiple      Live Births              Family History  Problem Relation Age of Onset   Diabetes Mother    Cancer Mother     Social History   Tobacco Use   Smoking status: Every Day    Packs/day: 0.50    Pack years: 0.00    Types: Cigarettes   Smokeless tobacco: Never  Vaping Use   Vaping Use: Never used  Substance Use Topics   Alcohol use: Yes    Comment: 10-12 airplane bottles/day or every other day.  last drink 1 week ago   Drug use: Not Currently    Types: Marijuana    Home Medications Prior to Admission medications   Medication Sig Start Date End Date Taking? Authorizing Provider  Aspirin-Acetaminophen-Caffeine (GOODY HEADACHE PO) Take 1 packet by mouth as needed (for pain or headaches).    Yes [provider]  acamprosate (CAMPRAL) 333 MG tablet Take 666 mg  by mouth 3 (three) times daily with meals.    [provider]  acetaminophen (TYLENOL) 500 MG tablet Take 2 tablets (1,000 mg total) by mouth every 6 (six) hours as needed for mild pain or fever. 06/16/19   Juliet Rude, PA-C  disulfiram (ANTABUSE) 250 MG tablet Take 250 mg by mouth daily.    [provider]  escitalopram (LEXAPRO) 10 MG tablet Take 10 mg by mouth daily.    [provider]  oxyCODONE (OXY IR/ROXICODONE) 5 MG immediate release tablet Take 1 tablet (5 mg total) by mouth every 4 (four) hours as needed for moderate pain. 06/16/19   Juliet Rude, PA-C    Allergies    Patient has no known allergies.  Review of Systems   Review of Systems  Constitutional:  Negative for chills and fever.  Eyes:  Negative for visual disturbance.  Respiratory:  Negative for shortness of breath.   Cardiovascular:  Negative for chest pain.  Gastrointestinal:   Negative for abdominal pain, nausea and vomiting.  Genitourinary:  Negative for difficulty urinating.  Musculoskeletal:  Positive for arthralgias and myalgias. Negative for back pain, joint swelling, neck pain and neck stiffness.  Skin:  Negative for color change, pallor, rash and wound.  Allergic/Immunologic: Negative for immunocompromised state.  Neurological:  Positive for weakness and numbness. Negative for dizziness, tremors, seizures, syncope, facial asymmetry, speech difficulty, light-headedness and headaches.  Psychiatric/Behavioral:  Negative for confusion.    Physical Exam Updated Vital Signs BP 124/83   Pulse 77   Temp 98 F (36.7 C) (Oral)   Resp 18   Ht 5\' 1"  (1.549 m)   Wt 54.4 kg   SpO2 100%   BMI 22.67 kg/m   Physical Exam Vitals and nursing note reviewed.  Constitutional:      General: She is not in acute distress.    Appearance: She is not ill-appearing, toxic-appearing or diaphoretic.  Eyes:     General: No scleral icterus.       Right eye: No discharge.        Left eye: No discharge.     Extraocular Movements: Extraocular movements intact.     Pupils: Pupils are equal, round, and reactive to light.  Cardiovascular:     Rate and Rhythm: Normal rate.     Pulses:          Dorsalis pedis pulses are 2+ on the right side and 2+ on the left side.  Pulmonary:     Effort: Pulmonary effort is normal.  Musculoskeletal:     Cervical back: Normal range of motion and neck supple. No swelling, edema, deformity, erythema, signs of trauma, lacerations, rigidity, spasms, torticollis, tenderness, bony tenderness or crepitus. No pain with movement. Normal range of motion.     Thoracic back: No swelling, edema, deformity, signs of trauma, lacerations, spasms, tenderness or bony tenderness.     Lumbar back: Tenderness present. No swelling, edema, deformity, signs of trauma, lacerations, spasms or bony tenderness.     Right hip: Tenderness and bony tenderness present. No  deformity, lacerations or crepitus.     Left hip: No deformity, lacerations, tenderness, bony tenderness or crepitus.     Right upper leg: Tenderness and bony tenderness present. No swelling, edema, deformity or lacerations.     Right knee: No swelling, deformity, effusion, erythema, ecchymosis, lacerations, bony tenderness or crepitus. No tenderness. Normal alignment.     Right lower leg: Normal.     Right ankle: No swelling, deformity, ecchymosis  or lacerations. No tenderness. Normal range of motion.     Right foot: Normal range of motion and normal capillary refill. No swelling, deformity, laceration, tenderness, bony tenderness or crepitus. Normal pulse.     Comments: Pelvis stable.  No shortening or rotation of the lower extremities noted no deformity, swelling, erythema or rash noted to bilateral lower extremities.  No midline tenderness or deformity to cervical, thoracic, or lumbar spine.  Tenderness to right lumbar back  4/5 strength to right plantar flexion and dorsiflexion.  Skin:    General: Skin is warm and dry.  Neurological:     General: No focal deficit present.     Mental Status: She is alert.     GCS: GCS eye subscore is 4. GCS verbal subscore is 5. GCS motor subscore is 6.     Cranial Nerves: No cranial nerve deficit or facial asymmetry.     Motor: No weakness, tremor or seizure activity.     Comments: No facial asymmetry or dysarthria.  Patient able to lift bilateral lower extremities and hold against gravity without difficulty.  Patient reports decrease sensation to light touch throughout entire right leg  Psychiatric:        Behavior: Behavior is cooperative.    ED Results / Procedures / Treatments   Labs (all labs ordered are listed, but only abnormal results are displayed) Labs Reviewed - No data to display  EKG None  Radiology DG Hip Unilat W or Wo Pelvis 2-3 Views Right  Result Date: 08/02/2020 CLINICAL DATA:  Pain, RIGHT-sided and LEFT leg pain. EXAM: DG  HIP (WITH OR WITHOUT PELVIS) 2-3V RIGHT COMPARISON:  None. FINDINGS: Soft tissues are unremarkable. No acute bone or joint abnormality about the RIGHT hip. No acute fracture of the bony pelvis. IMPRESSION: Negative evaluation of the pelvis and RIGHT hip. Electronically Signed   By: Donzetta Kohut M.D.   On: 08/02/2020 18:15    Procedures Procedures   Medications Ordered in ED Medications  oxyCODONE-acetaminophen (PERCOCET/ROXICET) 5-325 MG per tablet 1 tablet (1 tablet Oral Given 08/02/20 1723)    ED Course  I have reviewed the triage vital signs and the nursing notes.  Pertinent labs & imaging results that were available during my care of the patient were reviewed by me and considered in my medical decision making (see chart for details).    MDM Rules/Calculators/A&P                          Alert 35 year old female no acute distress, nontoxic-appearing.  Patient presents emergency department with a chief complaint of right hip pain.  Patient has had right hip pain over the last 4 months.  Pain has gotten worse over the last weeks.  Patient endorses decreased sensation and weakness right lower extremity.  Female RN was present as chaperone during physical exam.  On physical exam patient endorses decree sensation to light touch throughout entire right lower extremity.  4/5 strength to right plantar flexion and dorsiflexion.  Patient able to lift and hold bilateral lower extremities against gravity without difficulty.  Patient denies any bowel or bladder dysfunction, saddle anesthesia, fevers or chills.  Patient denies any history of IV drug use or cancer.  Low suspicion for cauda equina syndrome or epidural abscess at this time.  Patient has no systemic symptoms, redness or warmth to right hip.  Low suspicion for septic arthritis at this time.  Patient has no swelling or erythema to right lower  extremity.  Low suspicion for DVT at this time.  Will obtain x-ray imaging of right hip due to  tenderness.  X-ray imaging shows no acute fracture or dislocation.  Soft tissues unremarkable.  Patient reports improvement in pain after receiving Percocet in the emergency department.  Suspect sciatica as cause of patient's right hip/low back pain.  Will prescribe patient with prednisone taper and naproxen.  Patient advised to follow-up with orthopedic provider.  Patient given strict return precautions.  Patient expressed understanding of all instructions and is agreeable at this time.  Final Clinical Impression(s) / ED Diagnoses Final diagnoses:  Acute right-sided low back pain with right-sided sciatica    Rx / DC Orders ED Discharge Orders          Ordered    predniSONE (STERAPRED UNI-PAK 21 TAB) 10 MG (21) TBPK tablet  Daily        08/02/20 1909    naproxen (NAPROSYN) 500 MG tablet  2 times daily with meals        08/02/20 1909             Haskel SchroederBadalamente, Gigi Onstad R, PA-C 08/02/20 2340    Haskel SchroederBadalamente, Shaunice Levitan R, PA-C 08/03/20 0007    Virgina Norfolkuratolo, Adam, DO 08/03/20 1658

## 2020-08-02 NOTE — Discharge Instructions (Addendum)
You came to the emergency department today to be evaluated for your right hip/right low back pain.  Your physical exam was reassuring.  The x-ray of your right hip showed no broken bones or dislocations.  On your history of present illness and physical exam your pain is likely due to sciatica.  Because of this I have given you a steroid taper.  Have also given you prescription for naproxen.  Please do not combine this with ibuprofen, Aleve, or other NSAID medications.  Please follow-up with the sports medicine provider have given you information for.  Today you received medications that may make you sleepy or impair your ability to make decisions.  For the next 24 hours please do not drive, operate heavy machinery, care for a small child with out another adult present, or perform any activities that may cause harm to you or someone else if you were to fall asleep or be impaired.   I have given you a prescription for steroids today.  Some common side effects include feelings of extra energy, feeling warm, increased appetite, and stomach upset.  If you are diabetic your sugars may run higher than usual.    Get help right away if: You are not able to control when you urinate or have bowel movements (incontinence). You have: Weakness in your lower back, pelvis, buttocks, or legs that gets worse. Redness or swelling of your back. A burning sensation when you urinate.

## 2020-08-02 NOTE — ED Triage Notes (Signed)
Arrived via EMS for c/o rt sided hip and leg pain. Has had this intermittently for the past several months, was standing today for her job and pain became unbearable

## 2020-08-02 NOTE — ED Notes (Signed)
Standing at work, pain in lower rt area of back began, radiates into rt buttocks to rt foot, states she has numbness in RLE, numbness began 3 days ago. Strong left plantar and dorsal flexion, mod strength noted in rt plantar and dorsal flexion, pain increases with plantar and dorsal flexion. 2+ rt pedal and 2+ rt post tib pulse easily palpated, capillary refill WNL, temp and color WNL

## 2020-08-02 NOTE — ED Notes (Signed)
ED Provider at bedside. 

## 2020-12-19 ENCOUNTER — Encounter (HOSPITAL_BASED_OUTPATIENT_CLINIC_OR_DEPARTMENT_OTHER): Payer: Self-pay

## 2020-12-19 ENCOUNTER — Other Ambulatory Visit: Payer: Self-pay

## 2020-12-19 ENCOUNTER — Emergency Department (HOSPITAL_BASED_OUTPATIENT_CLINIC_OR_DEPARTMENT_OTHER)
Admission: EM | Admit: 2020-12-19 | Discharge: 2020-12-19 | Disposition: A | Discharge status: Home / Self Care | Payer: Medicaid Other | Attending: Student | Admitting: Student

## 2020-12-19 DIAGNOSIS — H6692 Otitis media, unspecified, left ear: Secondary | ICD-10-CM | POA: Diagnosis not present

## 2020-12-19 DIAGNOSIS — F17210 Nicotine dependence, cigarettes, uncomplicated: Secondary | ICD-10-CM | POA: Insufficient documentation

## 2020-12-19 DIAGNOSIS — H9202 Otalgia, left ear: Secondary | ICD-10-CM | POA: Diagnosis present

## 2020-12-19 MED ORDER — AMOXICILLIN-POT CLAVULANATE 875-125 MG PO TABS: 1.0000 | ORAL_TABLET | Freq: Two times a day (BID) | ORAL | 0 refills | Status: AC

## 2020-12-19 MED ORDER — ACETAMINOPHEN 325 MG PO TABS
650.0000 mg | ORAL_TABLET | Freq: Once | ORAL | Status: AC
  Administered 2020-12-19: 650 mg via ORAL
  Filled 2020-12-19: qty 2

## 2020-12-19 MED ORDER — AMOXICILLIN-POT CLAVULANATE 875-125 MG PO TABS
1.0000 | ORAL_TABLET | Freq: Once | ORAL | Status: AC
  Administered 2020-12-19: 1 via ORAL
  Filled 2020-12-19: qty 1

## 2020-12-19 MED ORDER — IBUPROFEN 400 MG PO TABS
ORAL_TABLET | ORAL | Status: AC
  Administered 2020-12-19: 400 mg via ORAL
  Filled 2020-12-19: qty 1

## 2020-12-19 MED ORDER — IBUPROFEN 400 MG PO TABS: 400.0000 mg | ORAL_TABLET | Freq: Once | ORAL | Status: AC | PRN

## 2020-12-19 NOTE — ED Triage Notes (Signed)
Pt c/o left ear pain starting today. Niece and nephew have RSV. States has "had a cold" the past few days.

## 2020-12-19 NOTE — Discharge Instructions (Addendum)
Please see attached instructions with otitis media with effusion that is included in your paperwork for suggestions of supportive treatment at home.  Please take all of your antibiotics until finished!   You may develop abdominal discomfort or diarrhea from the antibiotic.  You may help offset this with probiotics which you can buy or get in yogurt. Do not eat  or take the probiotics until 2 hours after your antibiotic.

## 2020-12-19 NOTE — ED Notes (Signed)
Discharge instructions discussed with pt. Pt verbalized understanding with no questions at this time.  

## 2020-12-19 NOTE — ED Provider Notes (Signed)
[facility] EMERGENCY DEPARTMENT Provider Note   CSN: [id number] Arrival date & time: 12/19/20  1846     History Chief Complaint  Patient presents with   Otalgia    Suzanne [name] is a 35 y.o. female.  Patient has a past medical history of alcohol use disorder, and depression.  She presents today with complaints of upper respiratory symptoms for the past week.  She said she was starting to feel better still has some significant congestion.  Today she said she started having significant left ear pain that has not improved at all.  She started noticing some bloody drainage from her left ear as well.  She denies any shortness of breath, chest pain, sore throat, abdominal pain, nausea, vomiting, fevers, chills.   Otalgia Associated symptoms: congestion and ear discharge   Associated symptoms: no abdominal pain, no cough, no diarrhea, no fever, no headaches, no rash, no rhinorrhea, no sore throat and no vomiting       Past Medical History:  Diagnosis Date   Bacterial infection    Brain cyst    Depression    Dysplasia of cervix, low grade (CIN 1)    ETOH abuse    History of chicken pox    HPV (human papilloma virus) anogenital infection    LGSIL (low grade squamous intraepithelial dysplasia)    UTI (urinary tract infection)    Vaginal trichomoniasis     Patient Active Problem List   Diagnosis Date Noted   PID (acute pelvic inflammatory disease) 06/15/2019   Depression, major, recurrent, moderate (HCC) 07/08/2014   Alcohol use disorder, severe, dependence (HCC) 07/07/2014    Past Surgical History:  Procedure Laterality Date   LAPAROSCOPIC APPENDECTOMY N/A 06/15/2019   Procedure: APPENDECTOMY LAPAROSCOPIC;  Surgeon: Andria Meuse, MD;  Location: [facility];  Service: General;  Laterality: N/A;   SKIN GRAFT       OB History     Gravida  2   Para  1   Term      Preterm      AB      Living         SAB      IAB      Ectopic      Multiple       Live Births              Family History  Problem Relation Age of Onset   Diabetes Mother    Cancer Mother     Social History   Tobacco Use   Smoking status: Every Day    Packs/day: 0.50    Types: Cigarettes   Smokeless tobacco: Never  Vaping Use   Vaping Use: Never used  Substance Use Topics   Alcohol use: Yes    Comment: 10-12 airplane bottles/day or every other day.  last drink 1 week ago   Drug use: Not Currently    Types: Marijuana    Home Medications Prior to Admission medications   Medication Sig Start Date End Date Taking? Authorizing Provider  amoxicillin-clavulanate (AUGMENTIN) 875-125 MG tablet Take 1 tablet by mouth every 12 (twelve) hours for 10 days. 12/19/20 12/29/20 Yes Lizette Pazos, Finis Bud, PA-C  acamprosate (CAMPRAL) 333 MG tablet Take 666 mg by mouth 3 (three) times daily with meals.    [provider]  acetaminophen (TYLENOL) 500 MG tablet Take 2 tablets (1,000 mg total) by mouth every 6 (six) hours as needed for mild pain or fever. 06/16/19  Barkley Boards R, PA-C  Aspirin-Acetaminophen-Caffeine (GOODY HEADACHE PO) Take 1 packet by mouth as needed (for pain or headaches).     [provider]  disulfiram (ANTABUSE) 250 MG tablet Take 250 mg by mouth daily.    [provider]  escitalopram (LEXAPRO) 10 MG tablet Take 10 mg by mouth daily.    [provider]  naproxen (NAPROSYN) 500 MG tablet Take 1 tablet (500 mg total) by mouth 2 (two) times daily with a meal. 08/02/20   Badalamente, Rudell Cobb, PA-C  oxyCODONE (OXY IR/ROXICODONE) 5 MG immediate release tablet Take 1 tablet (5 mg total) by mouth every 4 (four) hours as needed for moderate pain. 06/16/19   Norm Parcel, PA-C  predniSONE (STERAPRED UNI-PAK 21 TAB) 10 MG (21) TBPK tablet Take by mouth daily. Take 6 tabs by mouth daily  for 2 days, then 5 tabs for 2 days, then 4 tabs for 2 days, then 3 tabs for 2 days, 2 tabs for 2 days, then 1 tab by mouth daily for 2 days  08/02/20   Loni Beckwith, PA-C    Allergies    Patient has no known allergies.  Review of Systems   Review of Systems  Constitutional:  Negative for chills and fever.  HENT:  Positive for congestion, ear discharge and ear pain. Negative for rhinorrhea and sore throat.   Eyes:  Negative for visual disturbance.  Respiratory:  Negative for cough, chest tightness and shortness of breath.   Cardiovascular:  Negative for chest pain, palpitations and leg swelling.  Gastrointestinal:  Negative for abdominal pain, blood in stool, constipation, diarrhea, nausea and vomiting.  Genitourinary:  Negative for dysuria, flank pain and hematuria.  Musculoskeletal:  Negative for back pain.  Skin:  Negative for rash and wound.  Neurological:  Negative for dizziness, syncope, weakness, light-headedness and headaches.  Psychiatric/Behavioral:  Negative for confusion.   All other systems reviewed and are negative.  Physical Exam Updated Vital Signs BP (!) 133/94 (BP Location: Right Arm)   Pulse 89   Temp 97.9 F (36.6 C) (Oral)   Resp 20   Ht 5\' 1"  (1.549 m)   Wt 53.1 kg   LMP 12/12/2020 (Approximate)   SpO2 97%   BMI 22.11 kg/m   Physical Exam Vitals and nursing note reviewed.  Constitutional:      General: She is not in acute distress.    Appearance: Normal appearance. She is not ill-appearing, toxic-appearing or diaphoretic.  HENT:     Head: Normocephalic and atraumatic.     Right Ear: Tympanic membrane, ear canal and external ear normal. There is no impacted cerumen.     Left Ear: Tenderness present. A middle ear effusion is present. Tympanic membrane is injected, erythematous and bulging. Tympanic membrane is not perforated.     Ears:     Comments: Patient with erythema to TM and effusion noted.  TM is bulging.  TM is not perforated.  Tender to touch of left ear.  Right ear is normal.    Nose: Congestion and rhinorrhea present. No nasal deformity.     Mouth/Throat:     Lips: Pink.  No lesions.     Mouth: Mucous membranes are moist. No injury, lacerations, oral lesions or angioedema.     Pharynx: Oropharynx is clear. Uvula midline. No pharyngeal swelling, oropharyngeal exudate, posterior oropharyngeal erythema or uvula swelling.  Eyes:     General: Gaze aligned appropriately. No scleral icterus.       Right  eye: No discharge.        Left eye: No discharge.     Conjunctiva/sclera: Conjunctivae normal.     Right eye: Right conjunctiva is not injected. No exudate or hemorrhage.    Left eye: Left conjunctiva is not injected. No exudate or hemorrhage. Cardiovascular:     Rate and Rhythm: Normal rate and regular rhythm.     Pulses: Normal pulses.          Radial pulses are 2+ on the right side and 2+ on the left side.       Dorsalis pedis pulses are 2+ on the right side and 2+ on the left side.     Heart sounds: Normal heart sounds, S1 normal and S2 normal. Heart sounds not distant. No murmur heard.   No friction rub. No gallop. No S3 or S4 sounds.  Pulmonary:     Effort: Pulmonary effort is normal. No accessory muscle usage or respiratory distress.     Breath sounds: Normal breath sounds. No stridor. No wheezing, rhonchi or rales.  Chest:     Chest wall: No tenderness.  Abdominal:     General: Abdomen is flat. Bowel sounds are normal. There is no distension.     Palpations: Abdomen is soft. There is no mass or pulsatile mass.     Tenderness: There is no abdominal tenderness. There is no guarding or rebound.  Musculoskeletal:     Right lower leg: No edema.     Left lower leg: No edema.  Skin:    General: Skin is warm and dry.     Coloration: Skin is not jaundiced or pale.     Findings: No bruising, erythema, lesion or rash.  Neurological:     General: No focal deficit present.     Mental Status: She is alert and oriented to person, place, and time.     GCS: GCS eye subscore is 4. GCS verbal subscore is 5. GCS motor subscore is 6.  Psychiatric:        Mood and  Affect: Mood normal.        Behavior: Behavior normal. Behavior is cooperative.    ED Results / Procedures / Treatments   Labs (all labs ordered are listed, but only abnormal results are displayed) Labs Reviewed - No data to display  EKG None  Radiology No results found.  Procedures Procedures   Medications Ordered in ED Medications  amoxicillin-clavulanate (AUGMENTIN) 875-125 MG per tablet 1 tablet (has no administration in time range)  acetaminophen (TYLENOL) tablet 650 mg (has no administration in time range)  ibuprofen (ADVIL) tablet 400 mg (400 mg Oral Given 12/19/20 1902)    ED Course  I have reviewed the triage vital signs and the nursing notes.  Pertinent labs & imaging results that were available during my care of the patient were reviewed by me and considered in my medical decision making (see chart for details).    MDM Rules/Calculators/A&P                         Patient is a 35 year old female presents today with a week of upper respiratory symptoms including congestion.  Congestion is the only thing that is persistent.  She had significant left ear pain and drainage that started today and has been constant and worsening. On arrival, she is afebrile and vitals are hemodynamically stable. Left ear TM is very erythematous with effusion and bulging noted.  TM is intact and  not perforated.  Right ear TM nonconcerning.    Doubt herpes zoster.  Patient likely has otitis media with effusion secondary to sinus congestion possibly sinus infection.  Will give first dose of Augmentin in the emergency department.   Sending home with 10 days of Augmentin. Return precautions provided   Final Clinical Impression(s) / ED Diagnoses Final diagnoses:  Left otitis media, unspecified otitis media type    Rx / DC Orders ED Discharge Orders          Ordered    amoxicillin-clavulanate (AUGMENTIN) 875-125 MG tablet  Every 12 hours        12/19/20 2130              Sheila Oats 12/19/20 2136    Teressa Lower, MD 12/20/20 0010

## 2021-02-10 ENCOUNTER — Ambulatory Visit: Payer: Self-pay | Admitting: Orthopedic Surgery

## 2021-02-10 DIAGNOSIS — M5126 Other intervertebral disc displacement, lumbar region: Secondary | ICD-10-CM

## 2021-02-10 NOTE — H&P (Signed)
Suzanne [name] is an 36 y.o. female.   Chief Complaint: back and right leg pain HPI: Location: low back Quality: worsening Duration: 1 years Timing: chronic Context: cannot identify Associated Symptoms: numbness; tingling; radiation down leg Prior Imaging: MRI Previous Injections: did not help; 11/02/2020 [facility] 12/2020 [facility] Patient is in pain since the beginning in the summer. She had an MRI back in July. She has had 3 epidural steroid injections. She has pain radiating down to the outer aspect of the right leg not the left leg. No changes in bowel or bladder function fevers or chills. Worse with activity better with rest she has had therapy as well.  Outside MRI report was reviewed. 08/24/2020 from [facility]. Prominent degenerative change L5-S1 central disc herniation resulting in severe lateral recess stenosis with impingement of the traversing S1 nerve roots.  Acute on chronic radiculopathy.  Past Medical History:  Diagnosis Date   Bacterial infection    Brain cyst    Depression    Dysplasia of cervix, low grade (CIN 1)    ETOH abuse    History of chicken pox    HPV (human papilloma virus) anogenital infection    LGSIL (low grade squamous intraepithelial dysplasia)    UTI (urinary tract infection)    Vaginal trichomoniasis     Past Surgical History:  Procedure Laterality Date   LAPAROSCOPIC APPENDECTOMY N/A 06/15/2019   Procedure: APPENDECTOMY LAPAROSCOPIC;  Surgeon: Ileana Roup, MD;  Location: [facility];  Service: General;  Laterality: N/A;   SKIN GRAFT      Family History  Problem Relation Age of Onset   Diabetes Mother    Cancer Mother    Social History:  reports that she has been smoking cigarettes. She has been smoking an average of .5 packs per day. She has never used smokeless tobacco. She reports current alcohol use. She reports that she does not currently use drugs after having used the following drugs: Marijuana.  Allergies: No Known  Allergies  Current medications: HYDROcodone 5 mg-acetaminophen 325 mg tablet predniSONE 5 mg tablets in a dose pack TylenoL Zofran 4 mg tablet  Review of Systems  Constitutional: Negative.   HENT: Negative.    Eyes: Negative.   Respiratory: Negative.    Cardiovascular: Negative.   Gastrointestinal: Negative.   Endocrine: Negative.   Genitourinary: Negative.   Musculoskeletal:  Positive for back pain, gait problem and myalgias.  Skin: Negative.    There were no vitals taken for this visit. Physical Exam Constitutional:      Appearance: Normal appearance.  HENT:     Head: Normocephalic.     Right Ear: External ear normal.     Left Ear: External ear normal.     Nose: Nose normal.     Mouth/Throat:     Pharynx: Oropharynx is clear.  Eyes:     Conjunctiva/sclera: Conjunctivae normal.  Cardiovascular:     Rate and Rhythm: Normal rate and regular rhythm.     Pulses: Normal pulses.  Pulmonary:     Effort: Pulmonary effort is normal.     Breath sounds: Normal breath sounds.  Abdominal:     General: Bowel sounds are normal.  Musculoskeletal:     Cervical back: Normal range of motion.     Comments: Gait and Station: Appearance: ambulating with no assistive devices and antalgic gait.  Constitutional: General Appearance: healthy-appearing and distress (mild).  Psychiatric: Mood and Affect: active and alert.  Cardiovascular System: Edema Right: none; Dorsalis and posterior tibial  pulses 2+. Edema Left: none.  Abdomen: Inspection and Palpation: non-distended and no tenderness.  Skin: Inspection and palpation: no rash.  Lumbar Spine: Inspection: normal alignment. Bony Palpation of the Lumbar Spine: tender at lumbosacral junction.. Bony Palpation of the Right Hip: no tenderness of the greater trochanter and tenderness of the SI joint; Pelvis stable. Bony Palpation of the Left Hip: no tenderness of the greater trochanter and tenderness of the SI joint. Soft Tissue Palpation on  the Right: No flank pain with percussion. Active Range of Motion: limited flexion and extention.  Motor Strength: L1 Motor Strength on the Right: hip flexion iliopsoas 5/5. L1 Motor Strength on the Left: hip flexion iliopsoas 5/5. L2-L4 Motor Strength on the Right: knee extension quadriceps 5/5. L2-L4 Motor Strength on the Left: knee extension quadriceps 5/5. L5 Motor Strength on the Right: ankle dorsiflexion tibialis anterior 5/5 and great toe extension extensor hallucis longus 5/5. L5 Motor Strength on the Left: ankle dorsiflexion tibialis anterior 5/5 and great toe extension extensor hallucis longus 5/5. S1 Motor Strength on the Right: plantar flexion gastrocnemius 4/5. S1 Motor Strength on the Left: plantar flexion gastrocnemius 5/5.  Neurological System: Knee Reflex Right: normal (2). Knee Reflex Left: normal (2). Ankle Reflex Right: normal (2). Ankle Reflex Left: normal (2). Babinski Reflex Right: plantar reflex absent. Babinski Reflex Left: plantar reflex absent. Sensation on the Right: normal distal extremities and dereased sensation on the sole of the foot and the posterior leg (S1). Sensation on the Left: normal distal extremities. Special Tests on the Right: no clonus of the ankle/knee and seated straight leg raising test positive. Special Tests on the Left: no clonus of the ankle/knee.  Skin:    General: Skin is warm and dry.  Neurological:     Mental Status: She is alert.    Three-view x-rays demonstrate near end-stage disc degeneration L5-S1. No instability flexion-extension.  Outside MRI independently reviewed by myself [place] demonstrates a disc herniation L5-S1 paracentral to the right effacing both nerve roots S1. Severe lateral recess narrowing is noted.  Assessment/Plan Impression:  1. 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 With weakness in plantarflexion diminished sensation S1 dermatome. 2.  Absent left lower extremity radicular pain 3. 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 L5-S1 right. Patient has no evidence of radiculopathy on the left. I had an extensive discussion with the patient concerning the pathology relevant anatomy and treatment options. At this point exhausting conservative treatment and in the presence of a neurologic deficit we discussed microlumbar decompression. I discussed the risks and benefits including bleeding, infection, DVT, PE, anesthetic complications, worsening in their symptoms, improvement in their symptoms, C SF leakage, epidural fibrosis, need for future surgeries such as revision discectomy and lumbar fusion. I also indicated that this is an operation to basically decompress the nerve roots to allow recovery as opposed to fixing a herniated disc if it is encountered and that the incidence of recurrent chest disc herniation can approach 15%. Also that nerve root recovery is variable and may not recover completely. Any ligament or bone that is contributing to compressing the nerves will be removed as well.  I discussed the operative course including overnight in the hospital. Immediate ambulation. Follow-up in 2 weeks for suture removal. 6 weeks until healing of the herniation and surgical incision  followed by 6 weeks of reconditioning and strengthening of the core musculature. Also discussed the need to employ the concepts of disc pressure management and core motion following the surgery to minimize the risk of recurrent disc herniation. We will obtain preoperative clearance i if necessary and proceed accordingly.  No history of MRSA or DVT.  A prescription for a steroid dose pack was given to reduce inflammation. 5 mg tablets to be taken over a 6 a day period of time in decreasing  doses. The packaging provides guidance. In addition, a refill was given to be taken if persistent or recurrent pain is present. Maximum number of Sterapred dose packs in a year's period of time is approximately three. Any anti-inflammatory medication should be discontinued while using this dose pack but can be resumed following its completion. Side effects may include fluid retention, insomnia, and hyperactivity. If you are a diabetic monitor your blood glucose levels as steroids typically elevate those. You may need to supplement your current diabetes medication regiment accordingly. It is advised to consult with your medical physician for this.  A prescription for an opioid was given to be taken as directed for pain control. I discussed the risks including cognitive changes which may affect the ability to operate machinery and to drive. In addition the side effect of constipation as well as to avoid taking with conflicting medications that were discussed. The patient's prescription drug monitoring report was reviewed with no red flags noted.  Prescription for Zofran was given to be taken as directed.  We discussed that she may require a fusion in the future she understands.  Plan microlumbar decompression L5-S1 right  Cecilie Kicks, PA-C for Dr Tonita Cong 02/10/2021, 11:50 AM

## 2021-02-17 NOTE — Progress Notes (Signed)
Surgical Instructions    Your procedure is scheduled on Thursday, January 26th, 2023.   Report to [facility] Main Entrance "A" at 05:30 A.M., then check in with the Admitting office.  Call this number if you have problems the morning of surgery:  [phone]   If you have any questions prior to your surgery date call [phone]: Open Monday-Friday 8am-4pm    Remember:  Do not eat after midnight the night before your surgery  You may drink clear liquids until 04:30 the morning of your surgery.   Clear liquids allowed are: Water, Non-Citrus Juices (without pulp), Carbonated Beverages, Clear Tea, Black Coffee ONLY (NO MILK, CREAM OR POWDERED CREAMER of any kind), and Gatorade  Patient Instructions  The night before surgery:  No food after midnight. ONLY clear liquids after midnight  The day of surgery (if you do NOT have diabetes):  Drink ONE (1) Pre-Surgery Clear Ensure by 04:30 the morning of surgery. Drink in one sitting. Do not sip.  This drink was given to you during your hospital  pre-op appointment visit.  Nothing else to drink after completing the  Pre-Surgery Clear Ensure.          If you have questions, please contact your surgeons office.     Take these medicines the morning of surgery with A SIP OF WATER:  If needed:  HYDROcodone-acetaminophen (NORCO/VICODIN)  ondansetron (ZOFRAN)  As of today, STOP taking any Aspirin (unless otherwise instructed by your surgeon) Aleve, Naproxen, Ibuprofen, Motrin, Advil, Goody's, BC's, all herbal medications, fish oil, and all vitamins.   After your COVID test   You are not required to quarantine however you are required to wear a well-fitting mask when you are out and around people not in your household.  If your mask becomes wet or soiled, replace with a new one.  Wash your hands often with soap and water for 20 seconds or clean your hands with an alcohol-based hand sanitizer that contains at least 60% alcohol.  Do  not share personal items.  Notify your provider: if you are in close contact with someone who has COVID  or if you develop a fever of 100.4 or greater, sneezing, cough, sore throat, shortness of breath or body aches.   The day of surgery:          Do not wear jewelry or makeup Do not wear lotions, powders, perfumes, or deodorant. Do not shave 48 hours prior to surgery.   Do not bring valuables to the hospital. DO Not wear nail polish, gel polish, artificial nails, or any other type of covering on natural nails (fingers and toes) If you have artificial nails or gel coating that need to be removed by a nail salon, please have this removed prior to surgery. Artificial nails or gel coating may interfere with anesthesia's ability to adequately monitor your vital signs.              [facility] is not responsible for any belongings or valuables.  Do NOT Smoke (Tobacco/Vaping)  24 hours prior to your procedure  If you use a CPAP at night, you may bring your mask for your overnight stay.   Contacts, glasses, hearing aids, dentures or partials may not be worn into surgery, please bring cases for these belongings   For patients admitted to the hospital, discharge time will be determined by your treatment team.   Patients discharged the day of surgery will not be allowed to drive home, and someone needs  to stay with them for 24 hours.  NO VISITORS WILL BE ALLOWED IN PRE-OP WHERE PATIENTS ARE PREPPED FOR SURGERY.  ONLY 1 SUPPORT PERSON MAY BE PRESENT IN THE WAITING ROOM WHILE YOU ARE IN SURGERY.  IF YOU ARE TO BE ADMITTED, ONCE YOU ARE IN YOUR ROOM YOU WILL BE ALLOWED TWO (2) VISITORS. 1 (ONE) VISITOR MAY STAY OVERNIGHT BUT MUST ARRIVE TO THE ROOM BY 8pm.  Minor children may have two parents present. Special consideration for safety and communication needs will be reviewed on a case by case basis.  Special instructions:    Oral Hygiene is also important to reduce your risk of infection.  Remember  - BRUSH YOUR TEETH THE MORNING OF SURGERY WITH YOUR REGULAR TOOTHPASTE   [facility]- Preparing For Surgery  Before surgery, you can play an important role. Because skin is not sterile, your skin needs to be as free of germs as possible. You can reduce the number of germs on your skin by washing with CHG (chlorahexidine gluconate) Soap before surgery.  CHG is an antiseptic cleaner which kills germs and bonds with the skin to continue killing germs even after washing.     Please do not use if you have an allergy to CHG or antibacterial soaps. If your skin becomes reddened/irritated stop using the CHG.  Do not shave (including legs and underarms) for at least 48 hours prior to first CHG shower. It is OK to shave your face.  Please follow these instructions carefully.     Shower the NIGHT BEFORE SURGERY and the MORNING OF SURGERY with CHG Soap.   If you chose to wash your hair, wash your hair first as usual with your normal shampoo. After you shampoo, rinse your hair and body thoroughly to remove the shampoo.  Then [employer] and genitals (private parts) with your normal soap and rinse thoroughly to remove soap.  After that Use CHG Soap as you would any other liquid soap. You can apply CHG directly to the skin and wash gently with a scrungie or a clean washcloth.   Apply the CHG Soap to your body ONLY FROM THE NECK DOWN.  Do not use on open wounds or open sores. Avoid contact with your eyes, ears, mouth and genitals (private parts). Wash Face and genitals (private parts)  with your normal soap.   Wash thoroughly, paying special attention to the area where your surgery will be performed.  Thoroughly rinse your body with warm water from the neck down.  DO NOT shower/wash with your normal soap after using and rinsing off the CHG Soap.  Pat yourself dry with a CLEAN TOWEL.  Wear CLEAN PAJAMAS to bed the night before surgery  Place CLEAN SHEETS on your bed the night before your surgery  DO  NOT SLEEP WITH PETS.   Day of Surgery:  Take a shower with CHG soap. Wear Clean/Comfortable clothing the morning of surgery Do not apply any deodorants/lotions.   Remember to brush your teeth WITH YOUR REGULAR TOOTHPASTE.   Please read over the following fact sheets that you were given.

## 2021-02-20 ENCOUNTER — Encounter (HOSPITAL_COMMUNITY): Payer: Self-pay

## 2021-02-20 ENCOUNTER — Other Ambulatory Visit: Payer: Self-pay

## 2021-02-20 ENCOUNTER — Ambulatory Visit (HOSPITAL_COMMUNITY)
Admission: RE | Admit: 2021-02-20 | Discharge: 2021-02-20 | Disposition: A | Discharge status: Home / Self Care | Payer: Medicaid Other | Source: Ambulatory Visit | Attending: Orthopedic Surgery | Admitting: Orthopedic Surgery

## 2021-02-20 ENCOUNTER — Encounter (HOSPITAL_COMMUNITY)
Admission: RE | Admit: 2021-02-20 | Discharge: 2021-02-20 | Disposition: A | Discharge status: Home / Self Care | Payer: Medicaid Other | Source: Ambulatory Visit | Attending: Specialist | Admitting: Specialist

## 2021-02-20 VITALS — BP 101/67 | HR 83 | Temp 98.0°F | Resp 17 | Ht 61.0 in | Wt 114.2 lb

## 2021-02-20 DIAGNOSIS — Z01818 Encounter for other preprocedural examination: Secondary | ICD-10-CM

## 2021-02-20 DIAGNOSIS — U071 COVID-19: Secondary | ICD-10-CM | POA: Insufficient documentation

## 2021-02-20 DIAGNOSIS — M5126 Other intervertebral disc displacement, lumbar region: Secondary | ICD-10-CM

## 2021-02-20 DIAGNOSIS — M47817 Spondylosis without myelopathy or radiculopathy, lumbosacral region: Secondary | ICD-10-CM | POA: Diagnosis not present

## 2021-02-20 HISTORY — DX: Cardiac murmur, unspecified: R01.1

## 2021-02-20 HISTORY — DX: Gastro-esophageal reflux disease without esophagitis: K21.9

## 2021-02-20 LAB — COMPREHENSIVE METABOLIC PANEL
ALT: 29 U/L (ref 0–44)
AST: 25 U/L (ref 15–41)
Albumin: 3.6 g/dL (ref 3.5–5.0)
Alkaline Phosphatase: 67 U/L (ref 38–126)
Anion gap: 8 (ref 5–15)
BUN: 16 mg/dL (ref 6–20)
CO2: 23 mmol/L (ref 22–32)
Calcium: 8.6 mg/dL — ABNORMAL LOW (ref 8.9–10.3)
Chloride: 105 mmol/L (ref 98–111)
Creatinine, Ser: 0.62 mg/dL (ref 0.44–1.00)
GFR, Estimated: >60 mL/min (ref >60)
Glucose, Bld: 88 mg/dL (ref 70–99)
Potassium: 4 mmol/L (ref 3.5–5.1)
Sodium: 136 mmol/L (ref 135–145)
Total Bilirubin: 0.1 mg/dL — ABNORMAL LOW (ref 0.3–1.2)
Total Protein: 6.2 g/dL — ABNORMAL LOW (ref 6.5–8.1)

## 2021-02-20 LAB — CBC
HCT: 40.8 % (ref 36.0–46.0)
Hemoglobin: 13.4 g/dL (ref 12.0–15.0)
MCH: 34.5 pg — ABNORMAL HIGH (ref 26.0–34.0)
MCHC: 32.8 g/dL (ref 30.0–36.0)
MCV: 105.2 fL — ABNORMAL HIGH (ref 80.0–100.0)
Platelets: 270 10*3/uL (ref 150–400)
RBC: 3.88 MIL/uL (ref 3.87–5.11)
RDW: 12.7 % (ref 11.5–15.5)
WBC: 5.8 10*3/uL (ref 4.0–10.5)
nRBC: 0 % (ref 0.0–0.2)

## 2021-02-20 LAB — SURGICAL PCR SCREEN
MRSA, PCR: NEGATIVE
Staphylococcus aureus: NEGATIVE

## 2021-02-20 LAB — SARS CORONAVIRUS 2 (TAT 6-24 HRS): SARS Coronavirus 2: POSITIVE — AB

## 2021-02-20 NOTE — Progress Notes (Signed)
1945 Pt's COVID test result came back positive. Called to  Dr Ermelinda Das office and relayed the result thru Melissa. Also message left thru Dr Ermelinda Das cell.

## 2021-02-20 NOTE — Progress Notes (Signed)
PCP - Armanda Heritage, MD Cardiologist - denies  PPM/ICD - denies Device Orders - n/a Rep Notified - n/a  Chest x-ray - n/a EKG - n/a Stress Test - denies ECHO - denies Cardiac Cath - denies Lumbar Xray - 02/20/2021  Sleep Study - denies CPAP - n/a  Fasting Blood Sugar - n/a  Blood Thinner Instructions: n/a  Aspirin Instructions: Patient was instructed: As of today, STOP taking any Aspirin (unless otherwise instructed by your surgeon) Aleve, Naproxen, Ibuprofen, Motrin, Advil, Goody's, BC's, all herbal medications, fish oil, and all vitamins.  ERAS Protcol - yes PRE-SURGERY Ensure - yes  COVID TEST- done in PAT on 02/20/2021   Anesthesia review: Patient verbalized that she had heart murmur as a child, no complaints at this time. Patient doesn't have any cardiac tests and patient denies any distress. Karoline Caldwell, PA-C saw the patient in PAT. No further assessment needed.   Patient denies shortness of breath, fever, cough and chest pain at PAT appointment   All instructions explained to the patient, with a verbal understanding of the material. Patient agrees to go over the instructions while at home for a better understanding. Patient also instructed to self quarantine after being tested for COVID-19. The opportunity to ask questions was provided.

## 2021-02-22 ENCOUNTER — Ambulatory Visit: Payer: Self-pay | Admitting: Orthopedic Surgery

## 2021-03-07 ENCOUNTER — Encounter (HOSPITAL_COMMUNITY): Payer: Self-pay | Admitting: Specialist

## 2021-03-07 ENCOUNTER — Other Ambulatory Visit: Payer: Self-pay

## 2021-03-07 NOTE — Progress Notes (Signed)
Surgical Instructions    Your procedure is scheduled on Thursday 03/09/21   Report to [facility] Main Entrance "A" at 0840, then check in with the Admitting office.  Call this number if you have problems the morning of surgery:  [phone]   If you have any questions prior to your surgery date call [phone]: Open Monday-Friday 8am-4pm    Remember:  Do not eat after midnight the night before your surgery  You may drink clear liquids until 0730 the morning of your surgery.   Clear liquids allowed are: Water, Non-Citrus Juices (without pulp), Carbonated Beverages, Clear Tea, Black Coffee ONLY (NO MILK, CREAM OR POWDERED CREAMER of any kind), and Gatorade  Patient Instructions  The night before surgery:  No food after midnight. ONLY clear liquids after midnight  The day of surgery (if you do NOT have diabetes):  Drink ONE (1) Pre-Surgery Clear Ensure by 0730 the morning of surgery. Drink in one sitting. Do not sip.  This drink was given to you during your hospital  pre-op appointment visit.  Nothing else to drink after completing the  Pre-Surgery Clear Ensure.          If you have questions, please contact your surgeons office.     Take these medicines the morning of surgery with A SIP OF WATER:  If needed:  HYDROcodone-acetaminophen (NORCO/VICODIN)  ondansetron (ZOFRAN)  As of today, STOP taking any Aspirin (unless otherwise instructed by your surgeon) Aleve, Naproxen, Ibuprofen, Motrin, Advil, Goody's, BC's, all herbal medications, fish oil, and all vitamins.   After your COVID test   You are not required to quarantine however you are required to wear a well-fitting mask when you are out and around people not in your household.  If your mask becomes wet or soiled, replace with a new one.  Wash your hands often with soap and water for 20 seconds or clean your hands with an alcohol-based hand sanitizer that contains at least 60% alcohol.  Do not share personal  items.  Notify your provider: if you are in close contact with someone who has COVID  or if you develop a fever of 100.4 or greater, sneezing, cough, sore throat, shortness of breath or body aches.   The day of surgery:          Do not wear jewelry or makeup Do not wear lotions, powders, perfumes, or deodorant. Do not shave 48 hours prior to surgery.   Do not bring valuables to the hospital. DO Not wear nail polish, gel polish, artificial nails, or any other type of covering on natural nails (fingers and toes) If you have artificial nails or gel coating that need to be removed by a nail salon, please have this removed prior to surgery. Artificial nails or gel coating may interfere with anesthesia's ability to adequately monitor your vital signs.              [facility] is not responsible for any belongings or valuables.  Do NOT Smoke (Tobacco/Vaping)  24 hours prior to your procedure  If you use a CPAP at night, you may bring your mask for your overnight stay.   Contacts, glasses, hearing aids, dentures or partials may not be worn into surgery, please bring cases for these belongings   For patients admitted to the hospital, discharge time will be determined by your treatment team.   Patients discharged the day of surgery will not be allowed to drive home, and someone needs to stay with  them for 24 hours.  NO VISITORS WILL BE ALLOWED IN PRE-OP WHERE PATIENTS ARE PREPPED FOR SURGERY.  ONLY 1 SUPPORT PERSON MAY BE PRESENT IN THE WAITING ROOM WHILE YOU ARE IN SURGERY.  IF YOU ARE TO BE ADMITTED, ONCE YOU ARE IN YOUR ROOM YOU WILL BE ALLOWED TWO (2) VISITORS. 1 (ONE) VISITOR MAY STAY OVERNIGHT BUT MUST ARRIVE TO THE ROOM BY 8pm.  Minor children may have two parents present. Special consideration for safety and communication needs will be reviewed on a case by case basis.  Special instructions:    Oral Hygiene is also important to reduce your risk of infection.  Remember - BRUSH YOUR TEETH  THE MORNING OF SURGERY WITH YOUR REGULAR TOOTHPASTE   [facility]- Preparing For Surgery  Before surgery, you can play an important role. Because skin is not sterile, your skin needs to be as free of germs as possible. You can reduce the number of germs on your skin by washing with CHG (chlorahexidine gluconate) Soap before surgery.  CHG is an antiseptic cleaner which kills germs and bonds with the skin to continue killing germs even after washing.     Please do not use if you have an allergy to CHG or antibacterial soaps. If your skin becomes reddened/irritated stop using the CHG.  Do not shave (including legs and underarms) for at least 48 hours prior to first CHG shower. It is OK to shave your face.  Please follow these instructions carefully.     Shower the NIGHT BEFORE SURGERY and the MORNING OF SURGERY with CHG Soap.   If you chose to wash your hair, wash your hair first as usual with your normal shampoo. After you shampoo, rinse your hair and body thoroughly to remove the shampoo.  Then [employer] and genitals (private parts) with your normal soap and rinse thoroughly to remove soap.  After that Use CHG Soap as you would any other liquid soap. You can apply CHG directly to the skin and wash gently with a scrungie or a clean washcloth.   Apply the CHG Soap to your body ONLY FROM THE NECK DOWN.  Do not use on open wounds or open sores. Avoid contact with your eyes, ears, mouth and genitals (private parts). Wash Face and genitals (private parts)  with your normal soap.   Wash thoroughly, paying special attention to the area where your surgery will be performed.  Thoroughly rinse your body with warm water from the neck down.  DO NOT shower/wash with your normal soap after using and rinsing off the CHG Soap.  Pat yourself dry with a CLEAN TOWEL.  Wear CLEAN PAJAMAS to bed the night before surgery  Place CLEAN SHEETS on your bed the night before your surgery  DO NOT SLEEP WITH  PETS.   Day of Surgery:  Take a shower with CHG soap. Wear Clean/Comfortable clothing the morning of surgery Do not apply any deodorants/lotions.   Remember to brush your teeth WITH YOUR REGULAR TOOTHPASTE.   Please read over the following fact sheets that you were given.

## 2021-03-07 NOTE — Progress Notes (Addendum)
Pt was seen in PAT on 02/20/21. Pt tested positive for covid and surgery was rescheduled. Pt called [facility] to give pre-op instructions and instructed to follow what was on the packet given in PAT. Pt arrival time, ERAS time were updated and provided to pt. Verbally re-confirmed with pt medications and med hx. Pt confirmed no covid or cold symptoms. All questions answered.

## 2021-03-16 ENCOUNTER — Ambulatory Visit (HOSPITAL_COMMUNITY): Payer: Medicaid Other

## 2021-03-16 ENCOUNTER — Ambulatory Visit (HOSPITAL_COMMUNITY)
Admission: RE | Admit: 2021-03-16 | Discharge: 2021-03-16 | Disposition: A | Discharge status: Home / Self Care | Payer: Medicaid Other | Attending: Specialist | Admitting: Specialist

## 2021-03-16 ENCOUNTER — Ambulatory Visit (HOSPITAL_BASED_OUTPATIENT_CLINIC_OR_DEPARTMENT_OTHER): Payer: Medicaid Other | Admitting: Anesthesiology

## 2021-03-16 ENCOUNTER — Other Ambulatory Visit: Payer: Self-pay

## 2021-03-16 ENCOUNTER — Encounter (HOSPITAL_COMMUNITY): Payer: Self-pay | Admitting: Specialist

## 2021-03-16 ENCOUNTER — Encounter (HOSPITAL_COMMUNITY)
Admission: RE | Disposition: A | Discharge status: Home / Self Care | Payer: Self-pay | Source: Home / Self Care | Attending: Specialist

## 2021-03-16 ENCOUNTER — Ambulatory Visit (HOSPITAL_COMMUNITY): Payer: Medicaid Other | Admitting: Physician Assistant

## 2021-03-16 DIAGNOSIS — M4807 Spinal stenosis, lumbosacral region: Secondary | ICD-10-CM | POA: Diagnosis present

## 2021-03-16 DIAGNOSIS — F17210 Nicotine dependence, cigarettes, uncomplicated: Secondary | ICD-10-CM | POA: Insufficient documentation

## 2021-03-16 DIAGNOSIS — Z419 Encounter for procedure for purposes other than remedying health state, unspecified: Secondary | ICD-10-CM

## 2021-03-16 DIAGNOSIS — M5117 Intervertebral disc disorders with radiculopathy, lumbosacral region: Secondary | ICD-10-CM | POA: Diagnosis not present

## 2021-03-16 DIAGNOSIS — M5126 Other intervertebral disc displacement, lumbar region: Secondary | ICD-10-CM | POA: Diagnosis present

## 2021-03-16 HISTORY — PX: LUMBAR LAMINECTOMY/DECOMPRESSION MICRODISCECTOMY: SHX5026

## 2021-03-16 LAB — POCT PREGNANCY, URINE: Preg Test, Ur: NEGATIVE

## 2021-03-16 SURGERY — LUMBAR LAMINECTOMY/DECOMPRESSION MICRODISCECTOMY 1 LEVEL
Anesthesia: General | Site: Spine Lumbar | Laterality: Right

## 2021-03-16 MED ORDER — BUPIVACAINE-EPINEPHRINE 0.5% -1:200000 IJ SOLN
INTRAMUSCULAR | Status: AC
  Filled 2021-03-16: qty 1

## 2021-03-16 MED ORDER — BISACODYL 5 MG PO TBEC: 5.0000 mg | DELAYED_RELEASE_TABLET | Freq: Every day | ORAL | Status: DC | PRN

## 2021-03-16 MED ORDER — THROMBIN 20000 UNITS EX SOLR
CUTANEOUS | Status: DC | PRN
  Administered 2021-03-16: 20 mL

## 2021-03-16 MED ORDER — DEXAMETHASONE SODIUM PHOSPHATE 10 MG/ML IJ SOLN
INTRAMUSCULAR | Status: AC
  Filled 2021-03-16: qty 1

## 2021-03-16 MED ORDER — BUPIVACAINE-EPINEPHRINE 0.5% -1:200000 IJ SOLN
INTRAMUSCULAR | Status: DC | PRN
  Administered 2021-03-16: 3 mL

## 2021-03-16 MED ORDER — FENTANYL CITRATE (PF) 250 MCG/5ML IJ SOLN
INTRAMUSCULAR | Status: AC
  Filled 2021-03-16: qty 5

## 2021-03-16 MED ORDER — ONDANSETRON HCL 4 MG PO TABS: 4.0000 mg | ORAL_TABLET | Freq: Three times a day (TID) | ORAL | Status: DC | PRN

## 2021-03-16 MED ORDER — OXYCODONE HCL 5 MG PO TABS: 5.0000 mg | ORAL_TABLET | ORAL | Status: DC | PRN

## 2021-03-16 MED ORDER — KCL IN DEXTROSE-NACL 20-5-0.45 MEQ/L-%-% IV SOLN: INTRAVENOUS | Status: DC

## 2021-03-16 MED ORDER — SUGAMMADEX SODIUM 200 MG/2ML IV SOLN
INTRAVENOUS | Status: DC | PRN
  Administered 2021-03-16: 100 mg via INTRAVENOUS

## 2021-03-16 MED ORDER — ONDANSETRON HCL 4 MG/2ML IJ SOLN
INTRAMUSCULAR | Status: AC
  Filled 2021-03-16: qty 2

## 2021-03-16 MED ORDER — POLYETHYLENE GLYCOL 3350 17 G PO PACK: 17.0000 g | PACK | Freq: Every day | ORAL | Status: DC | PRN

## 2021-03-16 MED ORDER — ONDANSETRON HCL 4 MG/2ML IJ SOLN: 4.0000 mg | Freq: Four times a day (QID) | INTRAMUSCULAR | Status: DC | PRN

## 2021-03-16 MED ORDER — MIDAZOLAM HCL 2 MG/2ML IJ SOLN
INTRAMUSCULAR | Status: AC
  Filled 2021-03-16: qty 2

## 2021-03-16 MED ORDER — ORAL CARE MOUTH RINSE: 15.0000 mL | Freq: Once | OROMUCOSAL | Status: AC

## 2021-03-16 MED ORDER — ACETAMINOPHEN 650 MG RE SUPP: 650.0000 mg | RECTAL | Status: DC | PRN

## 2021-03-16 MED ORDER — FENTANYL CITRATE (PF) 100 MCG/2ML IJ SOLN
INTRAMUSCULAR | Status: DC | PRN
  Administered 2021-03-16: 100 ug via INTRAVENOUS
  Administered 2021-03-16 (×3): 50 ug via INTRAVENOUS

## 2021-03-16 MED ORDER — THROMBIN 20000 UNITS EX SOLR
CUTANEOUS | Status: AC
  Filled 2021-03-16: qty 20000

## 2021-03-16 MED ORDER — LACTATED RINGERS IV SOLN: INTRAVENOUS | Status: DC

## 2021-03-16 MED ORDER — HYDROMORPHONE HCL 1 MG/ML IJ SOLN
0.2500 mg | INTRAMUSCULAR | Status: DC | PRN
  Administered 2021-03-16: 0.5 mg via INTRAVENOUS
  Administered 2021-03-16 (×2): 0.25 mg via INTRAVENOUS

## 2021-03-16 MED ORDER — MIDAZOLAM HCL 2 MG/2ML IJ SOLN
INTRAMUSCULAR | Status: DC | PRN
  Administered 2021-03-16: 2 mg via INTRAVENOUS

## 2021-03-16 MED ORDER — METHOCARBAMOL 500 MG PO TABS
500.0000 mg | ORAL_TABLET | Freq: Four times a day (QID) | ORAL | Status: DC | PRN
  Administered 2021-03-16: 500 mg via ORAL
  Filled 2021-03-16: qty 1

## 2021-03-16 MED ORDER — OXYCODONE HCL 5 MG PO TABS
5.0000 mg | ORAL_TABLET | ORAL | 0 refills | Status: DC | PRN
Start: 2021-03-16 — End: 2022-08-13

## 2021-03-16 MED ORDER — DOCUSATE SODIUM 100 MG PO CAPS
100.0000 mg | ORAL_CAPSULE | Freq: Two times a day (BID) | ORAL | Status: DC
  Administered 2021-03-16: 100 mg via ORAL
  Filled 2021-03-16: qty 1

## 2021-03-16 MED ORDER — CHLORHEXIDINE GLUCONATE 0.12 % MT SOLN
15.0000 mL | Freq: Once | OROMUCOSAL | Status: AC
  Administered 2021-03-16: 15 mL via OROMUCOSAL
  Filled 2021-03-16: qty 15

## 2021-03-16 MED ORDER — METHOCARBAMOL 1000 MG/10ML IJ SOLN
500.0000 mg | Freq: Four times a day (QID) | INTRAVENOUS | Status: DC | PRN
  Filled 2021-03-16: qty 5

## 2021-03-16 MED ORDER — OXYCODONE HCL 5 MG PO TABS
10.0000 mg | ORAL_TABLET | ORAL | Status: DC | PRN
  Administered 2021-03-16 (×2): 10 mg via ORAL
  Filled 2021-03-16 (×2): qty 2

## 2021-03-16 MED ORDER — PROPOFOL 10 MG/ML IV BOLUS
INTRAVENOUS | Status: DC | PRN
  Administered 2021-03-16: 140 mg via INTRAVENOUS

## 2021-03-16 MED ORDER — DEXAMETHASONE SODIUM PHOSPHATE 10 MG/ML IJ SOLN
INTRAMUSCULAR | Status: DC | PRN
  Administered 2021-03-16: 8 mg via INTRAVENOUS

## 2021-03-16 MED ORDER — POLYETHYLENE GLYCOL 3350 17 G PO PACK: 17.0000 g | PACK | Freq: Every day | ORAL | 0 refills | Status: AC

## 2021-03-16 MED ORDER — METHOCARBAMOL 500 MG PO TABS: 500.0000 mg | ORAL_TABLET | Freq: Three times a day (TID) | ORAL | 1 refills | Status: DC | PRN

## 2021-03-16 MED ORDER — 0.9 % SODIUM CHLORIDE (POUR BTL) OPTIME
TOPICAL | Status: DC | PRN
  Administered 2021-03-16: 1000 mL

## 2021-03-16 MED ORDER — LIDOCAINE 2% (20 MG/ML) 5 ML SYRINGE
INTRAMUSCULAR | Status: DC | PRN
  Administered 2021-03-16: 60 mg via INTRAVENOUS

## 2021-03-16 MED ORDER — ROCURONIUM BROMIDE 10 MG/ML (PF) SYRINGE
PREFILLED_SYRINGE | INTRAVENOUS | Status: DC | PRN
  Administered 2021-03-16: 50 mg via INTRAVENOUS
  Administered 2021-03-16: 20 mg via INTRAVENOUS

## 2021-03-16 MED ORDER — MENTHOL 3 MG MT LOZG: 1.0000 | LOZENGE | OROMUCOSAL | Status: DC | PRN

## 2021-03-16 MED ORDER — CEFAZOLIN SODIUM-DEXTROSE 2-4 GM/100ML-% IV SOLN
2.0000 g | INTRAVENOUS | Status: AC
  Administered 2021-03-16: 2 g via INTRAVENOUS
  Filled 2021-03-16: qty 100

## 2021-03-16 MED ORDER — CEFAZOLIN SODIUM-DEXTROSE 2-4 GM/100ML-% IV SOLN
2.0000 g | Freq: Three times a day (TID) | INTRAVENOUS | Status: DC
  Administered 2021-03-16: 2 g via INTRAVENOUS
  Filled 2021-03-16: qty 100

## 2021-03-16 MED ORDER — PHENOL 1.4 % MT LIQD: 1.0000 | OROMUCOSAL | Status: DC | PRN

## 2021-03-16 MED ORDER — ONDANSETRON HCL 4 MG PO TABS: 4.0000 mg | ORAL_TABLET | Freq: Four times a day (QID) | ORAL | Status: DC | PRN

## 2021-03-16 MED ORDER — ALUM & MAG HYDROXIDE-SIMETH 200-200-20 MG/5ML PO SUSP: 30.0000 mL | Freq: Four times a day (QID) | ORAL | Status: DC | PRN

## 2021-03-16 MED ORDER — HYDROMORPHONE HCL 1 MG/ML IJ SOLN
INTRAMUSCULAR | Status: AC
  Filled 2021-03-16: qty 1

## 2021-03-16 MED ORDER — PROPOFOL 10 MG/ML IV BOLUS
INTRAVENOUS | Status: AC
  Filled 2021-03-16: qty 20

## 2021-03-16 MED ORDER — ROCURONIUM BROMIDE 10 MG/ML (PF) SYRINGE
PREFILLED_SYRINGE | INTRAVENOUS | Status: AC
  Filled 2021-03-16: qty 10

## 2021-03-16 MED ORDER — RISAQUAD PO CAPS
1.0000 | ORAL_CAPSULE | Freq: Every day | ORAL | Status: DC
  Filled 2021-03-16: qty 1

## 2021-03-16 MED ORDER — ONDANSETRON HCL 4 MG/2ML IJ SOLN
INTRAMUSCULAR | Status: DC | PRN
  Administered 2021-03-16: 4 mg via INTRAVENOUS

## 2021-03-16 MED ORDER — HYDROMORPHONE HCL 1 MG/ML IJ SOLN: 0.5000 mg | INTRAMUSCULAR | Status: DC | PRN

## 2021-03-16 MED ORDER — GABAPENTIN 300 MG PO CAPS: 300.0000 mg | ORAL_CAPSULE | Freq: Every evening | ORAL | Status: DC | PRN

## 2021-03-16 MED ORDER — ACETAMINOPHEN 325 MG PO TABS: 650.0000 mg | ORAL_TABLET | ORAL | Status: DC | PRN

## 2021-03-16 MED ORDER — ACETAMINOPHEN 10 MG/ML IV SOLN
1000.0000 mg | INTRAVENOUS | Status: AC
  Administered 2021-03-16: 500 mg via INTRAVENOUS
  Filled 2021-03-16: qty 100

## 2021-03-16 MED ORDER — DOCUSATE SODIUM 100 MG PO CAPS: 100.0000 mg | ORAL_CAPSULE | Freq: Two times a day (BID) | ORAL | 1 refills | Status: AC | PRN

## 2021-03-16 SURGICAL SUPPLY — 63 items
BAG COUNTER SPONGE SURGICOUNT (BAG) ×2 IMPLANT
BAG DECANTER FOR FLEXI CONT (MISCELLANEOUS) IMPLANT
BAND RUBBER #18 3X1/16 STRL (MISCELLANEOUS) ×4 IMPLANT
BUR EGG ELITE 5.0 (BURR) IMPLANT
BUR RND DIAMOND ELITE 4.0 (BURR) IMPLANT
BUR STRYKR EGG 5.0 (BURR) IMPLANT
CARTRIDGE OIL MAESTRO DRILL (MISCELLANEOUS) IMPLANT
CLEANER TIP ELECTROSURG 2X2 (MISCELLANEOUS) ×2 IMPLANT
CNTNR URN SCR LID CUP LEK RST (MISCELLANEOUS) ×1 IMPLANT
CONT SPEC 4OZ STRL OR WHT (MISCELLANEOUS) ×2
COVER BACK TABLE 60X90IN (DRAPES) ×1 IMPLANT
DIFFUSER DRILL AIR PNEUMATIC (MISCELLANEOUS) IMPLANT
DRAPE LAPAROTOMY 100X72X124 (DRAPES) ×2 IMPLANT
DRAPE MICROSCOPE LEICA (MISCELLANEOUS) ×2 IMPLANT
DRAPE SHEET LG 3/4 BI-LAMINATE (DRAPES) ×2 IMPLANT
DRAPE SURG 17X11 SM STRL (DRAPES) ×2 IMPLANT
DRAPE UTILITY XL STRL (DRAPES) ×2 IMPLANT
DRSG AQUACEL AG ADV 3.5X 4 (GAUZE/BANDAGES/DRESSINGS) ×1 IMPLANT
DRSG AQUACEL AG ADV 3.5X 6 (GAUZE/BANDAGES/DRESSINGS) IMPLANT
DRSG TELFA 3X8 NADH (GAUZE/BANDAGES/DRESSINGS) IMPLANT
DURAPREP 26ML APPLICATOR (WOUND CARE) ×2 IMPLANT
DURASEAL SPINE SEALANT 3ML (MISCELLANEOUS) IMPLANT
ELECT BLADE 4.0 EZ CLEAN MEGAD (MISCELLANEOUS)
ELECT REM PT RETURN 9FT ADLT (ELECTROSURGICAL) ×2
ELECTRODE BLDE 4.0 EZ CLN MEGD (MISCELLANEOUS) IMPLANT
ELECTRODE REM PT RTRN 9FT ADLT (ELECTROSURGICAL) ×1 IMPLANT
GLOVE SURG LTX SZ7.5 (GLOVE) ×1 IMPLANT
GLOVE SURG POLYISO LF SZ7.5 (GLOVE) ×2 IMPLANT
GLOVE SURG POLYISO LF SZ8 (GLOVE) ×4 IMPLANT
GLOVE SURG UNDER POLY LF SZ7 (GLOVE) ×2 IMPLANT
GLOVE SURG UNDER POLY LF SZ7.5 (GLOVE) ×3 IMPLANT
GOWN STRL REUS W/ TWL LRG LVL3 (GOWN DISPOSABLE) ×1 IMPLANT
GOWN STRL REUS W/ TWL XL LVL3 (GOWN DISPOSABLE) ×1 IMPLANT
GOWN STRL REUS W/TWL LRG LVL3 (GOWN DISPOSABLE) ×2
GOWN STRL REUS W/TWL XL LVL3 (GOWN DISPOSABLE) ×2
IV CATH 14GX2 1/4 (CATHETERS) ×2 IMPLANT
KIT BASIN OR (CUSTOM PROCEDURE TRAY) ×2 IMPLANT
NDL SPNL 18GX3.5 QUINCKE PK (NEEDLE) ×2 IMPLANT
NEEDLE 22X1 1/2 (OR ONLY) (NEEDLE) ×2 IMPLANT
NEEDLE SPNL 18GX3.5 QUINCKE PK (NEEDLE) ×4 IMPLANT
OIL CARTRIDGE MAESTRO DRILL (MISCELLANEOUS)
PACK LAMINECTOMY NEURO (CUSTOM PROCEDURE TRAY) ×2 IMPLANT
PAD DRESSING TELFA 3X8 NADH (GAUZE/BANDAGES/DRESSINGS) IMPLANT
PATTIES SURGICAL .25X.25 (GAUZE/BANDAGES/DRESSINGS) ×1 IMPLANT
PATTIES SURGICAL .75X.75 (GAUZE/BANDAGES/DRESSINGS) ×2 IMPLANT
SPONGE SURGIFOAM ABS GEL 100 (HEMOSTASIS) ×2 IMPLANT
SPONGE T-LAP 4X18 ~~LOC~~+RFID (SPONGE) IMPLANT
STAPLER VISISTAT (STAPLE) IMPLANT
STRIP CLOSURE SKIN 1/2X4 (GAUZE/BANDAGES/DRESSINGS) ×2 IMPLANT
SUT NURALON 4 0 TR CR/8 (SUTURE) IMPLANT
SUT PROLENE 3 0 PS 2 (SUTURE) IMPLANT
SUT VIC AB 1 CT1 27 (SUTURE)
SUT VIC AB 1 CT1 27XBRD ANTBC (SUTURE) IMPLANT
SUT VIC AB 1-0 CT2 27 (SUTURE) IMPLANT
SUT VIC AB 2-0 CT1 27 (SUTURE)
SUT VIC AB 2-0 CT1 TAPERPNT 27 (SUTURE) IMPLANT
SUT VIC AB 2-0 CT2 27 (SUTURE) IMPLANT
SYR 3ML LL SCALE MARK (SYRINGE) ×2 IMPLANT
TOWEL GREEN STERILE (TOWEL DISPOSABLE) ×2 IMPLANT
TOWEL GREEN STERILE FF (TOWEL DISPOSABLE) ×2 IMPLANT
TRAY FOLEY MTR SLVR 16FR STAT (SET/KITS/TRAYS/PACK) ×2 IMPLANT
WIPE CHG CHLORHEXIDINE 2% (PERSONAL CARE ITEMS) ×2 IMPLANT
YANKAUER SUCT BULB TIP NO VENT (SUCTIONS) ×2 IMPLANT

## 2021-03-16 NOTE — Anesthesia Procedure Notes (Signed)
Procedure Name: Intubation Date/Time: 03/16/2021 7:54 AM Performed by: Barrington Ellison, CRNA Pre-anesthesia Checklist: Patient identified, Emergency Drugs available, Suction available and Patient being monitored Patient Re-evaluated:Patient Re-evaluated prior to induction Oxygen Delivery Method: Circle System Utilized Preoxygenation: Pre-oxygenation with 100% oxygen Induction Type: IV induction Ventilation: Mask ventilation without difficulty Laryngoscope Size: Miller and 2 Tube type: Oral Tube size: 6.5 mm Number of attempts: 2 Airway Equipment and Method: Stylet and Oral airway Placement Confirmation: ETT inserted through vocal cords under direct vision, positive ETCO2 and breath sounds checked- equal and bilateral Secured at: 21 cm Tube secured with: Tape Dental Injury: Teeth and Oropharynx as per pre-operative assessment and Injury to lip  Difficulty Due To: Difficulty was unanticipated and Difficult Airway- due to anterior larynx Comments: DL x 1 Mac 3- Grade 3 view, unable to pass 7.0 ETT, DL x 1 Mil 2-Grade 1 view with 6.5 ETT- very narrow and small airway, smaller ETT recommended.

## 2021-03-16 NOTE — Plan of Care (Signed)
Pt doing well. Pt given D/C instructions with verbal understanding. Rx's were sent to the pharmacy by MD. Pt's incision is clean and dry with no sign of infection. Pt's IV was removed prior to D/C. Pt D/C'd home via wheelchair per MD order. Pt is stable @ D/C and has no other needs at this time. Alysandra Lobue, RN  

## 2021-03-16 NOTE — Anesthesia Postprocedure Evaluation (Signed)
Anesthesia Post Note  Patient: EVI MCCOMB  Procedure(s) Performed: Microlumbar decompression Lumbar Five-Sacral One Right (Right: Spine Lumbar)     Patient location during evaluation: PACU Anesthesia Type: General Level of consciousness: awake and alert Pain management: pain level controlled Vital Signs Assessment: post-procedure vital signs reviewed and stable Respiratory status: spontaneous breathing, nonlabored ventilation and respiratory function stable Cardiovascular status: blood pressure returned to baseline and stable Postop Assessment: no apparent nausea or vomiting Anesthetic complications: no   No notable events documented.  Last Vitals:  Vitals:   03/16/21 1015 03/16/21 1030  BP: 118/83 113/75  Pulse: (!) 107 94  Resp: (!) 23 16  Temp: 36.6 C   SpO2: 100% 95%    Last Pain:  Vitals:   03/16/21 1015  TempSrc:   PainSc: 10-Worst pain ever                 Estee Yohe,W. EDMOND

## 2021-03-16 NOTE — Discharge Instructions (Signed)

## 2021-03-16 NOTE — Transfer of Care (Signed)
Immediate Anesthesia Transfer of Care Note  Patient: Suzanne [name]  Procedure(s) Performed: Microlumbar decompression Lumbar Five-Sacral One Right (Right: Spine Lumbar)  Patient Location: PACU  Anesthesia Type:General  Level of Consciousness: awake, alert  and oriented  Airway & Oxygen Therapy: Patient Spontanous Breathing  Post-op Assessment: Report given to RN and Patient moving all extremities X 4  Post vital signs: Reviewed and stable  Last Vitals:  Vitals Value Taken Time  BP 118/83 03/16/21 1013  Temp    Pulse 107 03/16/21 1014  Resp 27 03/16/21 1014  SpO2 100 % 03/16/21 1014  Vitals shown include unvalidated device data.  Last Pain:  Vitals:   03/16/21 0609  TempSrc:   PainSc: 10-Worst pain ever      Patients Stated Pain Goal: 3 ([id number])  Complications: No notable events documented.

## 2021-03-16 NOTE — Op Note (Signed)
NAME: Suzanne [name], Suzanne [name] MEDICAL RECORD NO: [id number] ACCOUNT NO: [id number] DATE OF BIRTH: March 03, 1985 FACILITY: [facility] LOCATION: [facility]-PER[facility] PHYSICIAN: Johnn Hai, MD  Operative Report   DATE OF PROCEDURE: 03/16/2021  PREOPERATIVE DIAGNOSIS:  Spinal stenosis, herniated nucleus pulposus, L5-S1, right.  POSTOPERATIVE DIAGNOSIS:  Spinal stenosis, herniated nucleus pulposus, L5-S1, right.  PROCEDURES PERFORMED:   1.  Microlumbar decompression, L5-S1, right. 2.  Foraminotomies, L5-S1, right. 3.  Microdiskectomy, L5-S1, right.  ANESTHESIA:  General.  ASSISTANT:  Lacie Draft, PA.  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  No tension signs, EHL weakness indicated for microlumbar decompression, L5-S1.  Risks and benefits  discussed including bleeding, infection, damage to neurovascular structures, no change in symptoms, worsening symptoms, DVT, PE, anesthetic complications, etc.  DESCRIPTION OF PROCEDURE:  The patient in the supine position, after induction of adequate general anesthesia, 2 grams of Kefzol, she was placed prone on the Wilson frame.  All bony prominences were well padded.  Foley to gravity.  Lumbar region was  prepped and draped in the usual sterile fashion.  Two 18-gauge spinal needle was utilized to localize the L5-S1 interspace, confirmed with x-ray.  Incision was made from the spinous process of L5-S1.  Subcutaneous tissue was dissected.  Electrocautery  was utilized to achieve hemostasis.  Dorsal lumbar fascia divided in line with skin incision.  Paraspinous muscle elevated from lamina 5 and 1.  McCulloch retractor was placed.  Operating microscope was draped, brought in the surgical field after  confirmatory radiograph obtained.  Noted was a small interlaminar window at L5-S1.  I used a 3 mm Kerrison to perform a hemilaminotomy of the caudad edge of 3.  I then  identified the interspace of L5-S1.  I used a small curette to detach ligamentum  flavum from the cephalad edge of S1.  I performed a foraminotomy after protected the neural elements at all times.  I identified the S1 nerve root, gently mobilized it medially, decompressed the lateral recess to the medial border of the pedicle.  It was  very tight into the lateral recess and draped over the central disk herniation.  Care was taken to slowly maneuver the nerve root and with care.  I detached the ligamentum flavum from the interspace.  Large epidural venous plexus was noted tethering the  S1 nerve root.  This was cauterized and divided.  I then used an #11 blade to perform an annulotomy and copious portion of disk material was removed from the disk space with a micropituitary.  Further mobilized with a nerve hook and Epstein and a  Woodson.  Multiple fragments were then retrieved.  I used the catheter lavage in the disk space as well and then retrieved additional fragments.  Following this, a 1 cm of excursion of the S1 nerve root ____ pedicle without tension.  Confirmatory  radiograph obtained at the disk space.  I used thrombin-soaked Gelfoam over the epidural venous plexus.  I then removed the Gelfoam.  No active bleeding was noted.  Bone wax on the cancellous surfaces.  No evidence of CSF leakage or active bleeding.   Copiously irrigated the wound once again and I removed the Veterinary surgeon.  Paraspinous muscles inspected.  No evidence of active bleeding.  They are copiously irrigated once again.  Inspection revealed no evidence of CSF leakage or active bleeding.   I therefore closed the dorsal lumbar fascia with 0 Vicryl interrupted figure-of-eight  suture, subcutaneous with 2-0 and skin with subcuticular Prolene.  Sterile dressing applied.  She was placed supine on the hospital bed, extubated without difficulty  and transported to the recovery room in satisfactory condition.  The patient tolerated  the procedure well.  No complications.  Assistant  Lacie Draft, [place] was used throughout the case for patient positioning, suction, gentle intermittent neuro retraction and closure.     CHR D: 03/16/2021 10:11:19 am T: 03/16/2021 11:22:00 am  JOB: [id number]

## 2021-03-16 NOTE — H&P (Signed)
Suzanne [name] is an 36 y.o. female.   Chief Complaint: Right lower extremity pain HPI: Right lower extremity pain that has been ongoing for years.  Worsening as of late radiating down the right leg into the outer aspect of her foot despite rest activity modification injections  Past Medical History:  Diagnosis Date   Bacterial infection    Brain cyst    Depression    Dysplasia of cervix, low grade (CIN 1)    ETOH abuse    GERD (gastroesophageal reflux disease)    Heart murmur    History of chicken pox    HPV (human papilloma virus) anogenital infection    LGSIL (low grade squamous intraepithelial dysplasia)    UTI (urinary tract infection)    Vaginal trichomoniasis     Past Surgical History:  Procedure Laterality Date   APPENDECTOMY     CESAREAN SECTION  2008   LAPAROSCOPIC APPENDECTOMY N/A 06/15/2019   Procedure: APPENDECTOMY LAPAROSCOPIC;  Surgeon: Ileana Roup, MD;  Location: [facility];  Service: General;  Laterality: N/A;   SKIN GRAFT      Family History  Problem Relation Age of Onset   Diabetes Mother    Cancer Mother    Social History:  reports that she has been smoking cigarettes. She has been smoking an average of .5 packs per day. She has never used smokeless tobacco. She reports that she does not currently use alcohol. She reports current drug use. Drug: Marijuana.  Allergies: No Known Allergies  Medications Prior to Admission  Medication Sig Dispense Refill   Aspirin-Acetaminophen-Caffeine (GOODY HEADACHE PO) Take 1 packet by mouth as needed (for pain or headaches).      gabapentin (NEURONTIN) 300 MG capsule Take 300 mg by mouth at bedtime. As needed     HYDROcodone-acetaminophen (NORCO/VICODIN) 5-325 MG tablet Take 1 tablet by mouth every 6 (six) hours as needed for moderate pain.     naproxen (NAPROSYN) 500 MG tablet Take 1 tablet (500 mg total) by mouth 2 (two) times daily with a meal. 30 tablet 0   ondansetron (ZOFRAN) 4 MG tablet Take 4 mg by  mouth every 8 (eight) hours as needed for nausea or vomiting.      Results for orders placed or performed during the hospital encounter of 03/16/21 (from the past 48 hour(s))  Pregnancy, urine POC     Status: None   Collection Time: 03/16/21  6:22 AM  Result Value Ref Range   Preg Test, Ur NEGATIVE NEGATIVE    Comment:        THE SENSITIVITY OF THIS METHODOLOGY IS >24 mIU/mL    No results found.  Review of Systems  Musculoskeletal:  Positive for back pain.  Neurological:  Positive for weakness.  All other systems reviewed and are negative.  Blood pressure 105/62, pulse 85, temperature 98.2 F (36.8 C), temperature source Oral, resp. rate 17, height 5\' 1"  (1.549 m), weight 52.2 kg, SpO2 97 %. Physical Exam Constitutional:      Appearance: Normal appearance.  HENT:     Head: Normocephalic.     Nose: Nose normal.  Eyes:     Pupils: Pupils are equal, round, and reactive to light.  Cardiovascular:     Rate and Rhythm: Normal rate.  Pulmonary:     Effort: Pulmonary effort is normal.  Abdominal:     General: Abdomen is flat.  Musculoskeletal:     Cervical back: Normal range of motion.  Skin:    General: Skin  is warm and dry.     Capillary Refill: Capillary refill takes less than 2 seconds.  Neurological:     Mental Status: She is alert.     Motor: Weakness present.     Comments: Patient is tender in the right proximal gluteus.  Straight leg raise markedly positive on the left negative on the right.  Motor exam reveals slight plantarflexion weakness.  Some decrease sensation in S1 dermatome.  Decreased Achilles reflex on the right.  No evidence of DVT.  Psychiatric:        Mood and Affect: Mood normal.    MRI demonstrates disc herniation central and paracentral to the right L5-S1.  Deflection of the S1 nerve root.  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  Plan microlumbar decompression L5-S1 right.  Risk and benefits discussed including bleeding, infection, damage to neurovascular issues.  No change in symptoms worsening symptoms DVT PE anesthetic complication CSF leakage need for fusion in the future recurrent disc herniation etc.  Johnn Hai, MD 03/16/2021, 7:34 AM

## 2021-03-16 NOTE — Brief Op Note (Signed)
03/16/2021  10:05 AM  PATIENT:  Suzanne [name]  36 y.o. female  PRE-OPERATIVE DIAGNOSIS:  Herniated disc L5-S1 right  POST-OPERATIVE DIAGNOSIS:  * No post-op diagnosis entered *  PROCEDURE:  Procedure(s) with comments: Microlumbar decompression Lumbar Five-Sacral One Right (Right) - 3 C-Bed  SURGEON:  Surgeon(s) and Role:    Jene Every, MD - Primary  PHYSICIAN ASSISTANT:   ASSISTANTS: Bissell   ANESTHESIA:   general  EBL:  10 mL   BLOOD ADMINISTERED:none  DRAINS: none   LOCAL MEDICATIONS USED:  MARCAINE     SPECIMEN:  No Specimen  DISPOSITION OF SPECIMEN:  N/A  COUNTS:  YES  TOURNIQUET:  * No tourniquets in log *  DICTATION: .Other Dictation: Dictation Number [id number]  PLAN OF CARE: Admit for overnight observation  PATIENT DISPOSITION:  PACU - hemodynamically stable.   Delay start of Pharmacological VTE agent (>24hrs) due to surgical blood loss or risk of bleeding: yes

## 2021-03-16 NOTE — Interval H&P Note (Signed)
History and Physical Interval Note:  03/16/2021 7:39 AM  Suzanne [name]  has presented today for surgery, with the diagnosis of Herniated disc L5-S1 right.  The various methods of treatment have been discussed with the patient and family. After consideration of risks, benefits and other options for treatment, the patient has consented to  Procedure(s) with comments: Microlumbar decompression L5-S1 Right (Right) - 3 C-Bed as a surgical intervention.  The patient's history has been reviewed, patient examined, no change in status, stable for surgery.  I have reviewed the patient's chart and labs.  Questions were answered to the patient's satisfaction.     Javier Docker

## 2021-03-16 NOTE — Anesthesia Preprocedure Evaluation (Addendum)
Anesthesia Evaluation  Patient identified by MRN, date of birth, ID band Patient awake    Reviewed: Allergy & Precautions, H&P , NPO status , Patient's Chart, lab work & pertinent test results  Airway Mallampati: III  TM Distance: >3 FB Neck ROM: Full    Dental no notable dental hx. (+) Teeth Intact, Dental Advisory Given   Pulmonary Current Smoker and Patient abstained from smoking.,    Pulmonary exam normal breath sounds clear to auscultation       Cardiovascular negative cardio ROS   Rhythm:Regular Rate:Normal     Neuro/Psych Depression negative neurological ROS     GI/Hepatic Neg liver ROS, GERD  ,  Endo/Other  negative endocrine ROS  Renal/GU negative Renal ROS  negative genitourinary   Musculoskeletal   Abdominal   Peds  Hematology negative hematology ROS (+)   Anesthesia Other Findings   Reproductive/Obstetrics negative OB ROS                            Anesthesia Physical Anesthesia Plan  ASA: 2  Anesthesia Plan: General   Post-op Pain Management: Ofirmev IV (intra-op)*   Induction: Intravenous  PONV Risk Score and Plan: 3 and Ondansetron, Dexamethasone and Midazolam  Airway Management Planned: Oral ETT  Additional Equipment:   Intra-op Plan:   Post-operative Plan: Extubation in OR  Informed Consent: I have reviewed the patients History and Physical, chart, labs and discussed the procedure including the risks, benefits and alternatives for the proposed anesthesia with the patient or authorized representative who has indicated his/her understanding and acceptance.     Dental advisory given  Plan Discussed with: CRNA  Anesthesia Plan Comments:         Anesthesia Quick Evaluation

## 2021-03-16 NOTE — Evaluation (Signed)
Physical Therapy Evaluation Patient Details Name: Suzanne [name] MRN: [id number] DOB: 02/09/1985 Today's Date: 03/16/2021  History of Present Illness  Pt adm 2/16 for microlumbar decompression, foraminotiomies, and microdiskectomy all at rt  L5- S1. PMH - brain cyst, depression, etoh.  Clinical Impression  Pt doing well with mobility and no further PT needed.  Ready for dc from PT standpoint. Pt verbalized understanding of recommendation that walking is her exercise post surgery. Instructed pt not to stay in any position for too long especially sitting and to ger up and move around frequently. Pt demonstrated ability to put on/off socks and verbalized understanding of other ADL's. Pt eager to go home.         Recommendations for follow up therapy are one component of a multi-disciplinary discharge planning process, led by the attending physician.  Recommendations may be updated based on patient status, additional functional criteria and insurance authorization.  Follow Up Recommendations No PT follow up    Assistance Recommended at Discharge None  Patient can return home with the following       Equipment Recommendations None recommended by PT  Recommendations for Other Services       Functional Status Assessment Patient has not had a recent decline in their functional status     Precautions / Restrictions Precautions Precautions: Back Precaution Booklet Issued: Yes (comment)      Mobility  Bed Mobility Overal bed mobility: Modified Independent             General bed mobility comments: Initial verbal cues for technique    Transfers Overall transfer level: Modified independent Equipment used: None                    Ambulation/Gait Ambulation/Gait assistance: Modified independent (Device/Increase time) Gait Distance (Feet): 300 Feet Assistive device: None Gait Pattern/deviations: Step-through pattern, Decreased stride length, Trunk flexed Gait  velocity: decr Gait velocity interpretation: 1.31 - 2.62 ft/sec, indicative of limited community ambulator   General Gait Details: Slow, steady gait. At times pt with slilght forward flexion of trunk as pt reports this was how she had to walk prior to surgery due to pain. Was able to self correct and stand more upright  Stairs            Wheelchair Mobility    Modified Rankin (Stroke Patients Only)       Balance Overall balance assessment: No apparent balance deficits (not formally assessed)                                           Pertinent Vitals/Pain Pain Assessment Pain Assessment: Faces Faces Pain Scale: Hurts little more Pain Location: back and RLE Pain Descriptors / Indicators: Sore Pain Intervention(s): Limited activity within patient's tolerance    Home Living Family/patient expects to be discharged to:: Private residence Living Arrangements: Other relatives (sister) Available Help at Discharge: Family;Available PRN/intermittently   Home Access: Level entry       Home Layout: One level Home Equipment: None      Prior Function Prior Level of Function : Independent/Modified Independent                     Hand Dominance        Extremity/Trunk Assessment   Upper Extremity Assessment Upper Extremity Assessment: Overall WFL for tasks assessed    Lower Extremity Assessment  Lower Extremity Assessment: Overall WFL for tasks assessed       Communication   Communication: No difficulties  Cognition Arousal/Alertness: Awake/alert Behavior During Therapy: WFL for tasks assessed/performed Overall Cognitive Status: Within Functional Limits for tasks assessed                                          General Comments      Exercises     Assessment/Plan    PT Assessment Patient does not need any further PT services  PT Problem List         PT Treatment Interventions      PT Goals (Current goals can  be found in the Care Plan section)  Acute Rehab PT Goals Patient Stated Goal: go home today PT Goal Formulation: All assessment and education complete, DC therapy    Frequency       Co-evaluation               AM-PAC PT "6 Clicks" Mobility  Outcome Measure Help needed turning from your back to your side while in a flat bed without using bedrails?: None Help needed moving from lying on your back to sitting on the side of a flat bed without using bedrails?: None Help needed moving to and from a bed to a chair (including a wheelchair)?: None Help needed standing up from a chair using your arms (e.g., wheelchair or bedside chair)?: None Help needed to walk in hospital room?: None Help needed climbing 3-5 steps with a railing? : None 6 Click Score: 24    End of Session   Activity Tolerance: Patient tolerated treatment well Patient left: in bed Nurse Communication: Other (comment) (ok for DC) PT Visit Diagnosis: Other abnormalities of gait and mobility (R26.89)    Time: 5945-8592 PT Time Calculation (min) (ACUTE ONLY): 14 min   Charges:   PT Evaluation $PT Eval Low Complexity: 1 Low          [facility] PT Acute Rehabilitation Services Pager [phone] Office [phone]   Angelina Ok [facility] 03/16/2021, 2:55 PM

## 2021-03-17 ENCOUNTER — Encounter (HOSPITAL_COMMUNITY): Payer: Self-pay | Admitting: Specialist

## 2022-07-10 IMAGING — DX DG LUMBAR SPINE 2-3V
2 series · 2 of 2 positions shown · non-contrast
Comparison: CT 06/15/2019

CLINICAL DATA: Preop

EXAM:
LUMBAR SPINE - 2-3 VIEW

[w lumbar spine ap]
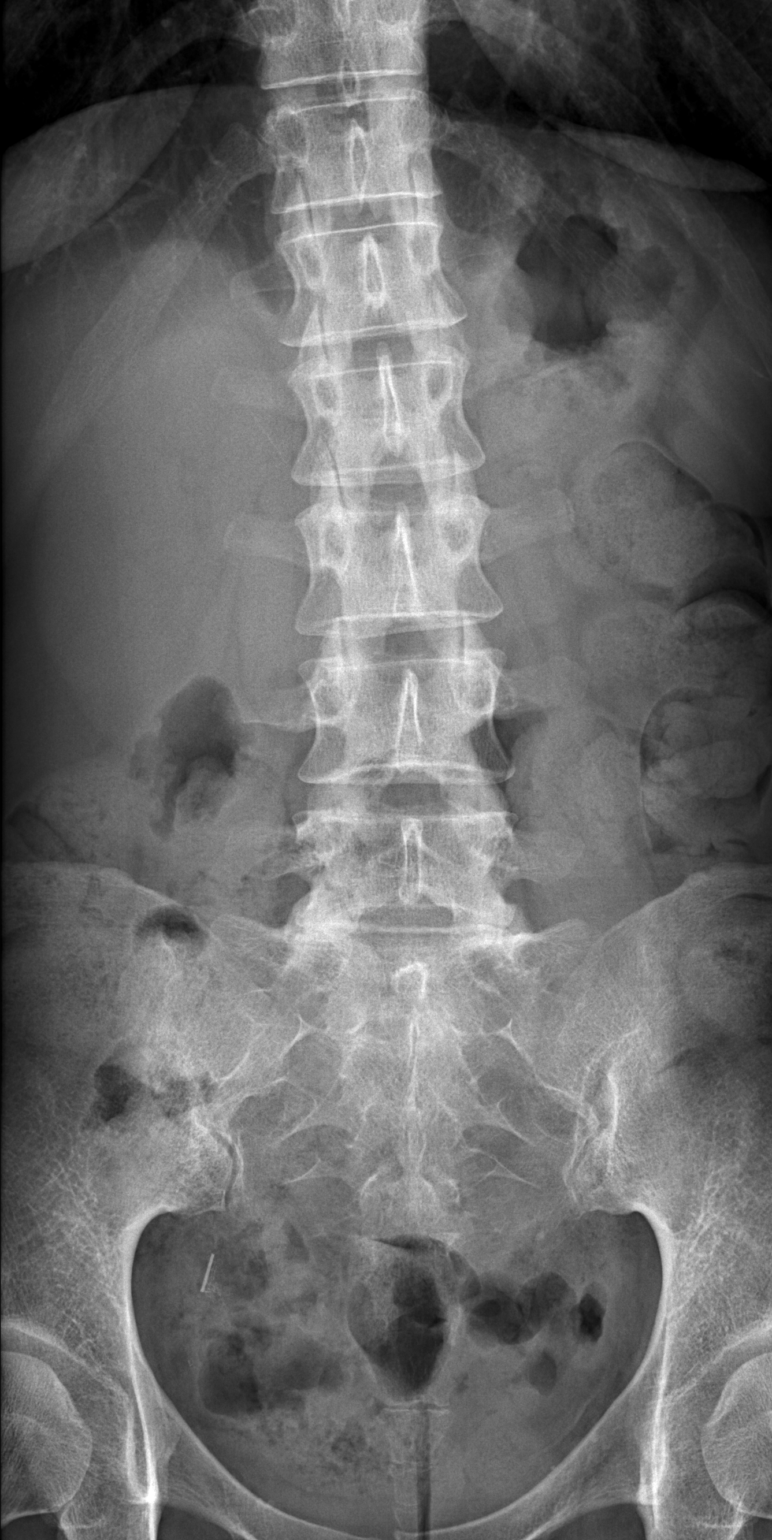

[w lumbar spine lat]
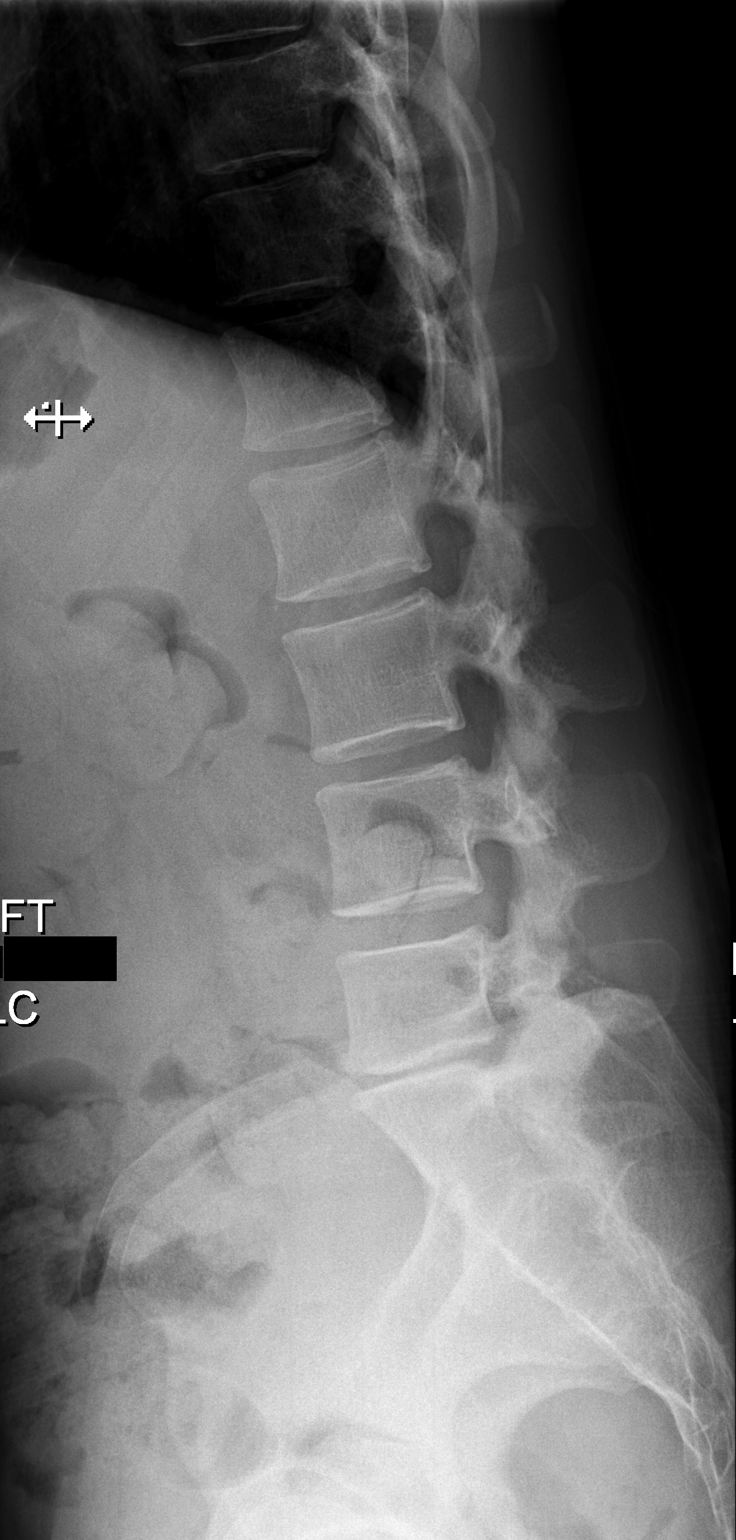

[2 of 2 positions shown; findings below may reference images not displayed]

FINDINGS: Straightening of the lumbar spine. Vertebral body heights are
maintained. Disc space narrowing at L5-S1. Remaining disc spaces
appear grossly patent.
IMPRESSION: Degenerative changes at L5-S1

## 2022-08-13 ENCOUNTER — Other Ambulatory Visit: Payer: Self-pay

## 2022-08-13 ENCOUNTER — Emergency Department
Admission: EM | Admit: 2022-08-13 | Discharge: 2022-08-13 | Disposition: A | Discharge status: Home / Self Care | Payer: MEDICAID | Attending: Emergency Medicine | Admitting: Emergency Medicine

## 2022-08-13 ENCOUNTER — Emergency Department: Payer: MEDICAID

## 2022-08-13 DIAGNOSIS — W228XXA Striking against or struck by other objects, initial encounter: Secondary | ICD-10-CM | POA: Diagnosis not present

## 2022-08-13 DIAGNOSIS — G44319 Acute post-traumatic headache, not intractable: Secondary | ICD-10-CM | POA: Diagnosis not present

## 2022-08-13 DIAGNOSIS — S0990XA Unspecified injury of head, initial encounter: Secondary | ICD-10-CM | POA: Diagnosis not present

## 2022-08-13 DIAGNOSIS — F17210 Nicotine dependence, cigarettes, uncomplicated: Secondary | ICD-10-CM | POA: Insufficient documentation

## 2022-08-13 LAB — POC URINE PREG, ED: Preg Test, Ur: NEGATIVE

## 2022-08-13 MED ORDER — IBUPROFEN 600 MG PO TABS: 600.0000 mg | ORAL_TABLET | Freq: Three times a day (TID) | ORAL | 0 refills | Status: AC | PRN
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
## 2022-08-13 NOTE — Discharge Instructions (Signed)
Follow-up with your primary care or [facility] if any continued problems.  Discontinue taking Goody powders.  Begin taking ibuprofen every 8 hours with food if needed for headache.
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
## 2022-10-23 ENCOUNTER — Emergency Department
Admission: EM | Admit: 2022-10-23 | Discharge: 2022-10-24 | Disposition: A | Discharge status: Home / Self Care | Payer: MEDICAID | Attending: Emergency Medicine | Admitting: Emergency Medicine

## 2022-10-23 ENCOUNTER — Other Ambulatory Visit: Payer: Self-pay

## 2022-10-23 DIAGNOSIS — M791 Myalgia, unspecified site: Secondary | ICD-10-CM | POA: Diagnosis not present

## 2022-10-23 DIAGNOSIS — D72829 Elevated white blood cell count, unspecified: Secondary | ICD-10-CM | POA: Diagnosis not present

## 2022-10-23 DIAGNOSIS — R1084 Generalized abdominal pain: Secondary | ICD-10-CM

## 2022-10-23 DIAGNOSIS — R1031 Right lower quadrant pain: Secondary | ICD-10-CM | POA: Insufficient documentation

## 2022-10-23 DIAGNOSIS — R52 Pain, unspecified: Secondary | ICD-10-CM

## 2022-10-23 DIAGNOSIS — N7011 Chronic salpingitis: Secondary | ICD-10-CM

## 2022-10-23 LAB — URINALYSIS, ROUTINE W REFLEX MICROSCOPIC
Bilirubin Urine: NEGATIVE
Glucose, UA: NEGATIVE mg/dL
Ketones, ur: 5 mg/dL — AB
Nitrite: NEGATIVE
Protein, ur: NEGATIVE mg/dL
Specific Gravity, Urine: 1.014 (ref 1.005–1.030)
pH: 7 (ref 5.0–8.0)

## 2022-10-23 LAB — CBC
HCT: 37 % (ref 36.0–46.0)
Hemoglobin: 12.5 g/dL (ref 12.0–15.0)
MCH: 34.5 pg — ABNORMAL HIGH (ref 26.0–34.0)
MCHC: 33.8 g/dL (ref 30.0–36.0)
MCV: 102.2 fL — ABNORMAL HIGH (ref 80.0–100.0)
Platelets: 300 10*3/uL (ref 150–400)
RBC: 3.62 MIL/uL — ABNORMAL LOW (ref 3.87–5.11)
RDW: 13.1 % (ref 11.5–15.5)
WBC: 11.4 10*3/uL — ABNORMAL HIGH (ref 4.0–10.5)
nRBC: 0 % (ref 0.0–0.2)

## 2022-10-23 LAB — COMPREHENSIVE METABOLIC PANEL
ALT: 13 U/L (ref 0–44)
AST: 14 U/L — ABNORMAL LOW (ref 15–41)
Albumin: 4 g/dL (ref 3.5–5.0)
Alkaline Phosphatase: 60 U/L (ref 38–126)
Anion gap: 11 (ref 5–15)
BUN: 10 mg/dL (ref 6–20)
CO2: 24 mmol/L (ref 22–32)
Calcium: 9 mg/dL (ref 8.9–10.3)
Chloride: 101 mmol/L (ref 98–111)
Creatinine, Ser: 0.59 mg/dL (ref 0.44–1.00)
GFR, Estimated: >60 mL/min (ref >60)
Glucose, Bld: 114 mg/dL — ABNORMAL HIGH (ref 70–99)
Potassium: 3.4 mmol/L — ABNORMAL LOW (ref 3.5–5.1)
Sodium: 136 mmol/L (ref 135–145)
Total Bilirubin: 0.6 mg/dL (ref 0.3–1.2)
Total Protein: 6.6 g/dL (ref 6.5–8.1)

## 2022-10-23 LAB — POC URINE PREG, ED: Preg Test, Ur: NEGATIVE

## 2022-10-23 LAB — LIPASE, BLOOD: Lipase: 25 U/L (ref 11–51)

## 2022-10-23 MED ORDER — ONDANSETRON HCL 4 MG PO TABS: 4.0000 mg | ORAL_TABLET | Freq: Four times a day (QID) | ORAL | 0 refills | Status: AC | PRN

## 2022-10-23 MED ORDER — PROCHLORPERAZINE EDISYLATE 10 MG/2ML IJ SOLN
10.0000 mg | Freq: Once | INTRAMUSCULAR | Status: AC
  Administered 2022-10-23: 10 mg via INTRAVENOUS
  Filled 2022-10-23: qty 2

## 2022-10-23 MED ORDER — SODIUM CHLORIDE 0.9 % IV BOLUS
1000.0000 mL | Freq: Once | INTRAVENOUS | Status: AC
  Administered 2022-10-23: 1000 mL via INTRAVENOUS

## 2022-10-23 MED ORDER — DIPHENHYDRAMINE HCL 50 MG/ML IJ SOLN
25.0000 mg | Freq: Once | INTRAMUSCULAR | Status: AC
  Administered 2022-10-23: 25 mg via INTRAVENOUS
  Filled 2022-10-23: qty 1

## 2022-10-23 MED ORDER — KETOROLAC TROMETHAMINE 15 MG/ML IJ SOLN
15.0000 mg | Freq: Once | INTRAMUSCULAR | Status: AC
  Administered 2022-10-23: 15 mg via INTRAVENOUS
  Filled 2022-10-23: qty 1

## 2022-10-23 MED ORDER — IOHEXOL 9 MG/ML PO SOLN
500.0000 mL | ORAL | Status: AC
  Administered 2022-10-23 (×2): 500 mL via ORAL

## 2022-10-23 NOTE — ED Provider Notes (Signed)
[facility] Provider Note    Event Date/Time   First MD Initiated Contact with Patient 10/23/22 2303     (approximate)   History   Abdominal Pain and Nausea   HPI  Suzanne [name] is a 37 year old female presenting to the emergency department for evaluation of abdominal pain.  Patient reports that earlier today she had onset of nausea, body aches, generalized abdominal pain worse in her right lower quadrant.  Does have a history of a prior appendectomy.  Had a negative COVID test at home.  No known sick contacts.  No chest pain or difficulty breathing.  Denies urinary symptoms.  Poor p.o. intake today.  Does additionally report a headache, not sudden in onset without associated numbness, tingling, weakness.     Physical Exam   Triage Vital Signs: ED Triage Vitals  Encounter Vitals Group     BP 10/23/22 1821 114/79     Systolic BP Percentile --      Diastolic BP Percentile --      Pulse Rate 10/23/22 1821 (!) 120     Resp 10/23/22 1821 18     Temp 10/23/22 1821 98.5 F (36.9 C)     Temp src --      SpO2 10/23/22 1821 98 %     Weight 10/23/22 1822 100 lb (45.4 kg)     Height 10/23/22 1822 5\' 1"  (1.549 m)     Head Circumference --      Peak Flow --      Pain Score 10/23/22 1822 10     Pain Loc --      Pain Education --      Exclude from Growth Chart --     Most recent vital signs: Vitals:   10/23/22 1821  BP: 114/79  Pulse: (!) 120  Resp: 18  Temp: 98.5 F (36.9 C)  SpO2: 98%     General: Awake, interactive  Head:   Atraumatic, no neck stiffness CV:  Mild tachycardia at the time of my evaluation, good peripheral perfusion. Resp:  Lungs clear, unlabored respirations.  Abd:  Soft, nondistended, tender to palpation diffusely most notably in the right lower quadrant, remainder of abdomen nontender Neuro:  Symmetric facial movement, fluid speech   ED Results / Procedures / Treatments   Labs (all labs ordered are listed, but only  abnormal results are displayed) Labs Reviewed  COMPREHENSIVE METABOLIC PANEL - Abnormal; Notable for the following components:      Result Value   Potassium 3.4 (*)    Glucose, Bld 114 (*)    AST 14 (*)    All other components within normal limits  CBC - Abnormal; Notable for the following components:   WBC 11.4 (*)    RBC 3.62 (*)    MCV 102.2 (*)    MCH 34.5 (*)    All other components within normal limits  URINALYSIS, ROUTINE W REFLEX MICROSCOPIC - Abnormal; Notable for the following components:   Color, Urine YELLOW (*)    APPearance HAZY (*)    Hgb urine dipstick MODERATE (*)    Ketones, ur 5 (*)    Leukocytes,Ua SMALL (*)    Bacteria, UA RARE (*)    All other components within normal limits  LIPASE, BLOOD  POC URINE PREG, ED     EKG EKG independently reviewed interpreted by myself (ER attending) demonstrates:    RADIOLOGY Imaging independently reviewed and interpreted by myself demonstrates:  CT abdomen pelvis pending  PROCEDURES:  Critical Care performed: No  Procedures   MEDICATIONS ORDERED IN ED: Medications  sodium chloride 0.9 % bolus 1,000 mL (1,000 mLs Intravenous New Bag/Given 10/23/22 2253)  ketorolac (TORADOL) 15 MG/ML injection 15 mg (15 mg Intravenous Given 10/23/22 2246)  diphenhydrAMINE (BENADRYL) injection 25 mg (25 mg Intravenous Given 10/23/22 2248)  prochlorperazine (COMPAZINE) injection 10 mg (10 mg Intravenous Given 10/23/22 2247)  iohexol (OMNIPAQUE) 9 MG/ML oral solution 500 mL (500 mLs Oral Contrast Given 10/23/22 2337)     IMPRESSION / MDM / ASSESSMENT AND PLAN / ED COURSE  I reviewed the triage vital signs and the nursing notes.  Differential diagnosis includes, but is not limited to, viral illness, consideration for colitis, diverticulitis, other acute intra-abdominal process  Patient's presentation is most consistent with acute presentation with potential threat to life or bodily function.  37 year old female presenting with  abdominal pain with multiple associated symptoms.  Labs sent from triage with mild leukocytosis, overall without severe derangement.  Urine overall not consistent with infection.  Mild dehydration noted with 5 ketones.  Normal lipase.  UPT negative.  Tachycardic on presentation, improved at the time of my initial evaluation.  While I overall suspect patient has a viral process given her constellation of symptoms, with her abdominal pain, leukocytosis, tachycardia will obtain CT to further evaluate.  Will also treat symptomatically with IV fluids, Toradol, Benadryl, Compazine.  Signed out to oncoming provider at 2300 pending CT and reevaluation.  If CT is reassuring, suspect patient will be able to be discharged with supportive care.      FINAL CLINICAL IMPRESSION(S) / ED DIAGNOSES   Final diagnoses:  Generalized abdominal pain  Body aches     Rx / DC Orders   ED Discharge Orders          Ordered    ondansetron (ZOFRAN) 4 MG tablet  Every 6 hours PRN        10/23/22 2323             Note:  This document was prepared using Dragon voice recognition software and may include unintentional dictation errors.   Trinna Post, MD 10/23/22 [phone]

## 2022-10-23 NOTE — ED Triage Notes (Signed)
Pt to ED for abd pain and nausea started today.  Drinking soda in triage.

## 2022-10-24 ENCOUNTER — Emergency Department: Payer: MEDICAID

## 2022-10-24 MED ORDER — IOHEXOL 300 MG/ML  SOLN
75.0000 mL | Freq: Once | INTRAMUSCULAR | Status: AC | PRN
  Administered 2022-10-24: 75 mL via INTRAVENOUS

## 2022-10-24 NOTE — Discharge Instructions (Addendum)
Your testing in the emergency department did not show any emergency conditions that would require an emergency surgery or hospitalization, fortunately.  Take Zofran as needed for nausea and vomiting.  Drink plenty of fluids to stay well-hydrated.  Find Pedialyte or similar electrolyte rehydration formulas at your local pharmacy.   Incidentally your picture showed a cystic structure in your right pelvis which may represent an ovarian cyst or hydrosalpinx for which she should follow-up with her gynecologist for further instructions on repeat imaging or treatment as needed.  The structure was found in 2021 as well on a CT scan you had at that time.  If you do not have a gynecologist call [facility] gynecology at the number attached.   Thank you for choosing [place] for your health care today!  Please see your primary doctor this week for a follow up appointment.   If you have any new, worsening, or unexpected symptoms call your doctor right away or come back to the emergency department for reevaluation.  It was my pleasure to care for you today.   Daneil Dan Modesto Charon, MD

## 2022-10-25 ENCOUNTER — Other Ambulatory Visit: Payer: Self-pay

## 2022-10-25 ENCOUNTER — Inpatient Hospital Stay (HOSPITAL_COMMUNITY)
Admission: AD | Admit: 2022-10-25 | Discharge: 2022-10-25 | Disposition: A | Discharge status: Home / Self Care | Payer: MEDICAID | Attending: Obstetrics and Gynecology | Admitting: Obstetrics and Gynecology

## 2022-10-25 ENCOUNTER — Encounter: Payer: Self-pay | Admitting: Emergency Medicine

## 2022-10-25 DIAGNOSIS — N898 Other specified noninflammatory disorders of vagina: Secondary | ICD-10-CM | POA: Insufficient documentation

## 2022-10-25 DIAGNOSIS — N7091 Salpingitis, unspecified: Secondary | ICD-10-CM | POA: Diagnosis not present

## 2022-10-25 DIAGNOSIS — R1031 Right lower quadrant pain: Secondary | ICD-10-CM | POA: Diagnosis present

## 2022-10-25 LAB — CBC
HCT: 31.9 % — ABNORMAL LOW (ref 36.0–46.0)
Hemoglobin: 10.8 g/dL — ABNORMAL LOW (ref 12.0–15.0)
MCH: 34.6 pg — ABNORMAL HIGH (ref 26.0–34.0)
MCHC: 33.9 g/dL (ref 30.0–36.0)
MCV: 102.2 fL — ABNORMAL HIGH (ref 80.0–100.0)
Platelets: 264 10*3/uL (ref 150–400)
RBC: 3.12 MIL/uL — ABNORMAL LOW (ref 3.87–5.11)
RDW: 12.8 % (ref 11.5–15.5)
WBC: 9.3 10*3/uL (ref 4.0–10.5)
nRBC: 0 % (ref 0.0–0.2)

## 2022-10-25 LAB — LIPASE, BLOOD: Lipase: 27 U/L (ref 11–51)

## 2022-10-25 LAB — URINALYSIS, ROUTINE W REFLEX MICROSCOPIC
Bacteria, UA: NONE SEEN
Bilirubin Urine: NEGATIVE
Glucose, UA: NEGATIVE mg/dL
Ketones, ur: 5 mg/dL — AB
Nitrite: NEGATIVE
Protein, ur: 30 mg/dL — AB
Specific Gravity, Urine: 1.035 — ABNORMAL HIGH (ref 1.005–1.030)
WBC, UA: >50 WBC/hpf (ref 0–5)
pH: 5 (ref 5.0–8.0)

## 2022-10-25 LAB — COMPREHENSIVE METABOLIC PANEL
ALT: 16 U/L (ref 0–44)
AST: 18 U/L (ref 15–41)
Albumin: 3.3 g/dL — ABNORMAL LOW (ref 3.5–5.0)
Alkaline Phosphatase: 66 U/L (ref 38–126)
Anion gap: 10 (ref 5–15)
BUN: 14 mg/dL (ref 6–20)
CO2: 23 mmol/L (ref 22–32)
Calcium: 8.5 mg/dL — ABNORMAL LOW (ref 8.9–10.3)
Chloride: 102 mmol/L (ref 98–111)
Creatinine, Ser: 0.64 mg/dL (ref 0.44–1.00)
GFR, Estimated: >60 mL/min (ref >60)
Glucose, Bld: 124 mg/dL — ABNORMAL HIGH (ref 70–99)
Potassium: 3.1 mmol/L — ABNORMAL LOW (ref 3.5–5.1)
Sodium: 135 mmol/L (ref 135–145)
Total Bilirubin: 0.4 mg/dL (ref 0.3–1.2)
Total Protein: 6.4 g/dL — ABNORMAL LOW (ref 6.5–8.1)

## 2022-10-25 LAB — WET PREP, GENITAL
Sperm: NONE SEEN
Trich, Wet Prep: NONE SEEN
WBC, Wet Prep HPF POC: >=10 — AB (ref <10)
Yeast Wet Prep HPF POC: NONE SEEN

## 2022-10-25 LAB — POC URINE PREG, ED: Preg Test, Ur: NEGATIVE

## 2022-10-25 NOTE — MAU Note (Signed)
Third and final call-pt not present in waiting room

## 2022-10-25 NOTE — ED Triage Notes (Signed)
Pt presents ambulatory to triage via POV with complaints of lower abdominal pain and vaginal discharge. Pt states that she was seen here two days ago for same and was told she had an ovarian cyst. Pt states that she has had increased vaginal discharge over the last day. A&Ox4 at this time. Denies CP or SOB.

## 2022-10-25 NOTE — MAU Note (Signed)
Not in lobby

## 2022-10-26 ENCOUNTER — Emergency Department
Admission: EM | Admit: 2022-10-26 | Discharge: 2022-10-26 | Disposition: A | Discharge status: Home / Self Care | Payer: MEDICAID | Attending: Emergency Medicine | Admitting: Emergency Medicine

## 2022-10-26 ENCOUNTER — Emergency Department: Payer: MEDICAID

## 2022-10-26 DIAGNOSIS — N898 Other specified noninflammatory disorders of vagina: Secondary | ICD-10-CM

## 2022-10-26 DIAGNOSIS — R1031 Right lower quadrant pain: Secondary | ICD-10-CM

## 2022-10-26 DIAGNOSIS — N7093 Salpingitis and oophoritis, unspecified: Secondary | ICD-10-CM

## 2022-10-26 LAB — CHLAMYDIA/NGC RT PCR (ARMC ONLY)
Chlamydia Tr: NOT DETECTED
N gonorrhoeae: NOT DETECTED

## 2022-10-26 MED ORDER — LEVOFLOXACIN 500 MG PO TABS: 500.0000 mg | ORAL_TABLET | Freq: Every day | ORAL | 0 refills | Status: AC

## 2022-10-26 MED ORDER — KETOROLAC TROMETHAMINE 30 MG/ML IJ SOLN
30.0000 mg | Freq: Once | INTRAMUSCULAR | Status: AC
  Administered 2022-10-26: 30 mg via INTRAMUSCULAR
  Filled 2022-10-26: qty 1

## 2022-10-26 MED ORDER — CEPHALEXIN 500 MG PO CAPS: 500.0000 mg | ORAL_CAPSULE | Freq: Four times a day (QID) | ORAL | 0 refills | Status: DC

## 2022-10-26 MED ORDER — METRONIDAZOLE 500 MG PO TABS
500.0000 mg | ORAL_TABLET | Freq: Once | ORAL | Status: AC
  Administered 2022-10-26: 500 mg via ORAL
  Filled 2022-10-26: qty 1

## 2022-10-26 MED ORDER — AZITHROMYCIN 500 MG PO TABS
1000.0000 mg | ORAL_TABLET | Freq: Once | ORAL | Status: AC
  Administered 2022-10-26: 1000 mg via ORAL
  Filled 2022-10-26: qty 2

## 2022-10-26 MED ORDER — LIDOCAINE HCL (PF) 1 % IJ SOLN
1.0000 mL | Freq: Once | INTRAMUSCULAR | Status: AC
  Administered 2022-10-26: 1 mL
  Filled 2022-10-26: qty 5

## 2022-10-26 MED ORDER — METRONIDAZOLE 500 MG PO TABS: 500.0000 mg | ORAL_TABLET | Freq: Two times a day (BID) | ORAL | 0 refills | Status: AC

## 2022-10-26 MED ORDER — METRONIDAZOLE 500 MG PO TABS: 500.0000 mg | ORAL_TABLET | Freq: Two times a day (BID) | ORAL | 0 refills | Status: DC

## 2022-10-26 MED ORDER — CEFTRIAXONE SODIUM 500 MG IJ SOLR
500.0000 mg | Freq: Once | INTRAMUSCULAR | Status: AC
  Administered 2022-10-26: 500 mg via INTRAMUSCULAR
  Filled 2022-10-26: qty 500

## 2022-10-26 MED ORDER — OXYCODONE HCL 5 MG PO TABS: 5.0000 mg | ORAL_TABLET | Freq: Three times a day (TID) | ORAL | 0 refills | Status: AC | PRN

## 2022-10-26 MED ORDER — MORPHINE SULFATE (PF) 4 MG/ML IV SOLN
4.0000 mg | Freq: Once | INTRAVENOUS | Status: AC
  Administered 2022-10-26: 4 mg via INTRAMUSCULAR
  Filled 2022-10-26: qty 1

## 2022-10-26 NOTE — Discharge Instructions (Addendum)
Take antibiotics for the full course as prescribed and follow-up with your gynecologist this week as scheduled.  Take acetaminophen 650 mg and ibuprofen 400 mg every 6 hours for pain.  Take with food. For severe pain take oxycodone as prescribed.    Thank you for choosing [place] for your health care today!  Please see your primary doctor this week for a follow up appointment.   If you have any new, worsening, or unexpected symptoms call your doctor right away or come back to the emergency department for reevaluation.  It was my pleasure to care for you today.   Daneil Dan Modesto Charon, MD

## 2022-10-26 NOTE — ED Notes (Signed)
Patient transported to Ultrasound 

## 2022-10-26 NOTE — ED Notes (Signed)
Pt back from US

## 2022-10-26 NOTE — ED Provider Notes (Addendum)
[facility] Provider Note    Event Date/Time   First MD Initiated Contact with Patient 10/26/22 0120     (approximate)   History   Abdominal Pain and Vaginal Discharge   HPI  Suzanne [name] is a 37 y.o. female   Past medical history of alcohol use, prior pelvic inflammatory disease, depression, who presents emerged apartment with lower abdominal pain.  She has had several days of nausea, body aches, generalized abdominal pain but worsening in the right lower quadrant with a history of appendectomy, who presented originally to the emergency department on 10/23/2022 with unremarkable workup and CT scan showing right ovarian/adnexal dilation that had been present and largely unchanged from 2021 and was ultimately discharged.  Pain has continued and so she represented to the emergency department today.  She notes that there has been some vaginal discharge, thin, white, and foul-smelling.  She denies fever or chills.  External Medical Documents Reviewed: Emergency department visit dated 10/24/2022 including CT scan of the abdomen pelvis showing a slightly more conspicuous compared to 2021 dilated tubular right adnexal finding suggestive of hydrosalpinx.      Physical Exam   Triage Vital Signs: ED Triage Vitals  Encounter Vitals Group     BP 10/25/22 2316 111/74     Systolic BP Percentile --      Diastolic BP Percentile --      Pulse Rate 10/25/22 2316 (!) 104     Resp 10/25/22 2316 18     Temp 10/25/22 2316 98 F (36.7 C)     Temp Source 10/25/22 2316 Oral     SpO2 10/25/22 2316 98 %     Weight 10/25/22 2322 99 lb 13.9 oz (45.3 kg)     Height 10/25/22 2322 5\' 1"  (1.549 m)     Head Circumference --      Peak Flow --      Pain Score 10/25/22 2316 9     Pain Loc --      Pain Education --      Exclude from Growth Chart --     Most recent vital signs: Vitals:   10/26/22 0400 10/26/22 0500  BP:    Pulse: 98 90  Resp:    Temp:    SpO2: 100%  100%    General: Awake, no distress.  CV:  Good peripheral perfusion.  Resp:  Normal effort. Abd:  No distention. Other:  Vital signs within normal limits and no fever.  She does have lower abdominal tenderness more on the right side.  Speculum exam shows thin white/yellow discharge and cervical motion tenderness.   ED Results / Procedures / Treatments   Labs (all labs ordered are listed, but only abnormal results are displayed) Labs Reviewed  WET PREP, GENITAL - Abnormal; Notable for the following components:      Result Value   Clue Cells Wet Prep HPF POC PRESENT (*)    WBC, Wet Prep HPF POC >=10 (*)    All other components within normal limits  COMPREHENSIVE METABOLIC PANEL - Abnormal; Notable for the following components:   Potassium 3.1 (*)    Glucose, Bld 124 (*)    Calcium 8.5 (*)    Total Protein 6.4 (*)    Albumin 3.3 (*)    All other components within normal limits  CBC - Abnormal; Notable for the following components:   RBC 3.12 (*)    Hemoglobin 10.8 (*)    HCT 31.9 (*)  MCV 102.2 (*)    MCH 34.6 (*)    All other components within normal limits  URINALYSIS, ROUTINE W REFLEX MICROSCOPIC - Abnormal; Notable for the following components:   Color, Urine AMBER (*)    APPearance CLOUDY (*)    Specific Gravity, Urine 1.035 (*)    Hgb urine dipstick MODERATE (*)    Ketones, ur 5 (*)    Protein, ur 30 (*)    Leukocytes,Ua MODERATE (*)    All other components within normal limits  CHLAMYDIA/NGC RT PCR ([facility] ONLY)            URINE CULTURE  LIPASE, BLOOD  POC URINE PREG, ED     I ordered and reviewed the above labs they are notable for urinalysis with leukocytes but no bacteria, clue cells present on wet prep, no leukocytosis.   RADIOLOGY I independently reviewed and interpreted pelvic ultrasound and see a cystic structure in the right adnexa concerning for ovarian cyst or abscess I also reviewed radiologist's formal read.   PROCEDURES:  Critical Care  performed: No  Procedures   MEDICATIONS ORDERED IN ED: Medications  azithromycin (ZITHROMAX) tablet 1,000 mg (1,000 mg Oral Given 10/26/22 0337)  cefTRIAXone (ROCEPHIN) injection 500 mg (500 mg Intramuscular Given 10/26/22 0343)  lidocaine (PF) (XYLOCAINE) 1 % injection 1-2.1 mL (1 mL Other Given 10/26/22 0342)  metroNIDAZOLE (FLAGYL) tablet 500 mg (500 mg Oral Given 10/26/22 0336)  ketorolac (TORADOL) 30 MG/ML injection 30 mg (30 mg Intramuscular Given 10/26/22 0338)  morphine (PF) 4 MG/ML injection 4 mg (4 mg Intramuscular Given 10/26/22 0338)     IMPRESSION / MDM / ASSESSMENT AND PLAN / ED COURSE  I reviewed the triage vital signs and the nursing notes.                                Patient's presentation is most consistent with acute presentation with potential threat to life or bodily function.  Differential diagnosis includes, but is not limited to, tubo-ovarian abscess, ovarian cyst, ovarian torsion, sexually transmitted infection, PID   MDM:    Patient with persistent lower abdominal pain with vaginal discharge now on exam, foul-smelling thin white discharge and cervical motion tenderness will obtain STI testing but also empirically treat for gonorrhea/chlamydia with ceftriaxone and azithromycin, wet prep shows clue cells we will also treat with metronidazole first dose in the emergency department and a prescription for the same.  Given her right lower quadrant pain may be a result of the known adnexal mass that is longstanding seen again on CT scan yesterday but will further characterize today with an ultrasound to rule out torsion or abscess.  -- Suspected pyosalpinx discussed with Dr Dalbert Garnet of Gyn - offered medical management trial for patient vs. Surgical consideration today and she opts for medical management with outpt abx and f/u on Tuesday with Gyn as scheduled. Non-toxic appearance, no sign of sepsis. DC      FINAL CLINICAL IMPRESSION(S) / ED DIAGNOSES   Final  diagnoses:  Vaginal discharge  RLQ abdominal pain  Right pyosalpinx     Rx / DC Orders   ED Discharge Orders          Ordered    metroNIDAZOLE (FLAGYL) 500 MG tablet  2 times daily,   Status:  Discontinued        10/26/22 0315    cephALEXin (KEFLEX) 500 MG capsule  4 times daily,   Status:  Discontinued        10/26/22 0315    levofloxacin (LEVAQUIN) 500 MG tablet  Daily        10/26/22 0610    metroNIDAZOLE (FLAGYL) 500 MG tablet  2 times daily        10/26/22 0610    oxyCODONE (ROXICODONE) 5 MG immediate release tablet  Every 8 hours PRN        10/26/22 0612             Note:  This document was prepared using Dragon voice recognition software and may include unintentional dictation errors.    Pilar Jarvis, MD 10/26/22 3086    Pilar Jarvis, MD 10/26/22 [phone]

## 2022-10-26 NOTE — ED Notes (Signed)
 Provided pt with discharge instructions and education. All of pt questions answered. Pt in possession of all belongings. Pt AAOX4 and stable at time of discharge.Pt ambulated w/ steady gait towards ED exit.

## 2022-10-26 NOTE — ED Notes (Signed)
ED Provider at bedside. 

## 2022-10-27 LAB — URINE CULTURE: Culture: NO GROWTH

## 2023-06-29 ENCOUNTER — Other Ambulatory Visit: Payer: Self-pay

## 2023-06-29 ENCOUNTER — Ambulatory Visit
Admission: RE | Admit: 2023-06-29 | Discharge: 2023-06-29 | Disposition: A | Discharge status: Home / Self Care | Payer: MEDICAID | Source: Ambulatory Visit | Attending: Internal Medicine | Admitting: Internal Medicine

## 2023-06-29 DIAGNOSIS — N3001 Acute cystitis with hematuria: Secondary | ICD-10-CM | POA: Insufficient documentation

## 2023-06-29 DIAGNOSIS — N898 Other specified noninflammatory disorders of vagina: Secondary | ICD-10-CM | POA: Diagnosis present

## 2023-06-29 LAB — POCT URINALYSIS DIP (MANUAL ENTRY)
Glucose, UA: 100 mg/dL — AB
Nitrite, UA: POSITIVE — AB
Protein Ur, POC: 30 mg/dL — AB
Spec Grav, UA: 1.01
Urobilinogen, UA: 0.2 U/dL
pH, UA: 5

## 2023-06-29 MED ORDER — NITROFURANTOIN MONOHYD MACRO 100 MG PO CAPS
100.0000 mg | ORAL_CAPSULE | Freq: Two times a day (BID) | ORAL | 0 refills | Status: DC
Start: 2023-06-29 — End: 2023-08-20

## 2023-06-29 NOTE — ED Triage Notes (Addendum)
 I have urinary frequency small odor and burning. - Entered by patient  Pt reports frequency, bladder pressure, feeling like "bladder isn't fully emptied" x 2 days. Also endorses vaginal discharge and concenr for exposure to STI. Would like testing. States she had PID last year.

## 2023-06-29 NOTE — ED Provider Notes (Signed)
 [facility]    CSN: [id number] Arrival date & time: 06/29/23  1055      History   Chief Complaint Chief Complaint  Patient presents with   Vaginal Discharge   Urinary Frequency    HPI Suzanne [name] is [name] 38 y.o. female.   38 year old female who presents urgent care with complaints of urgency, frequency, dysuria and send Suzanne [name] vaginal discharge.  This started the day before yesterday with the vaginal discharge then progressing to frequency, urgency and dysuria.  She denies any fevers or chills.  She is also having some lower abdominal pain.  She denies any nausea, vomiting, diarrhea or other constitutional symptoms      Vaginal Discharge Associated symptoms: dysuria   Associated symptoms: no abdominal pain, no fever and no vomiting   Urinary Frequency Pertinent negatives include no chest pain, no abdominal pain and no shortness of breath.    Past Medical History:  Diagnosis Date   Bacterial infection    Brain cyst    Depression    Dysplasia of cervix, low grade (CIN 1)    ETOH abuse    GERD (gastroesophageal reflux disease)    Heart murmur    History of chicken pox    HPV (human papilloma virus) anogenital infection    LGSIL (low grade squamous intraepithelial dysplasia)    UTI (urinary tract infection)    Vaginal trichomoniasis     Patient Active Problem List   Diagnosis Date Noted   HNP (herniated nucleus pulposus), lumbar 03/16/2021   PID (acute pelvic inflammatory disease) 06/15/2019   Depression, major, recurrent, moderate (HCC) 07/08/2014   Alcohol use disorder, severe, dependence (HCC) 07/07/2014    Past Surgical History:  Procedure Laterality Date   APPENDECTOMY     CESAREAN SECTION  2008   LAPAROSCOPIC APPENDECTOMY N/[name] 06/15/2019   Procedure: APPENDECTOMY LAPAROSCOPIC;  Surgeon: Suzanne Stabs, MD;  Location: [facility];  Service: General;  Laterality: N/[name];   LUMBAR LAMINECTOMY/DECOMPRESSION MICRODISCECTOMY Right 03/16/2021    Procedure: Microlumbar decompression Lumbar Five-Sacral One Right;  Surgeon: Suzanne Blanch, MD;  Location: [facility] OR;  Service: Orthopedics;  Laterality: Right;  3 C-Bed   SKIN GRAFT      OB History     Gravida  2   Para  1   Term      Preterm      AB      Living         SAB      IAB      Ectopic      Multiple      Live Births               Home Medications    Prior to Admission medications   Medication Sig Start Date End Date Taking? Authorizing Provider  nitrofurantoin, macrocrystal-monohydrate, (MACROBID) 100 MG capsule Take 1 capsule (100 mg total) by mouth 2 (two) times daily. 06/29/23  Yes Suzanne Ingram A, PA-C  docusate sodium  (COLACE) 100 MG capsule Take 1 capsule (100 mg total) by mouth 2 (two) times daily as needed for mild constipation. 03/16/21   Suzanne Blanch, MD  gabapentin  (NEURONTIN ) 300 MG capsule Take 300 mg by mouth at bedtime. As needed    [provider]  ibuprofen  (ADVIL ) 600 MG tablet Take 1 tablet (600 mg total) by mouth every 8 (eight) hours as needed. 08/13/22   Stafford Eagles, PA-C  ondansetron  (ZOFRAN ) 4 MG tablet Take 4 mg by mouth every 8 (eight)  hours as needed for nausea or vomiting.    [provider]  polyethylene glycol (MIRALAX  / GLYCOLAX ) 17 g packet Take 17 g by mouth daily. 03/16/21   Suzanne Blanch, MD    Family History Family History  Problem Relation Age of Onset   Diabetes Mother    Cancer Mother     Social History Social History   Tobacco Use   Smoking status: Every Day    Current packs/day: 0.50    Types: Cigarettes   Smokeless tobacco: Never  Vaping Use   Vaping status: Never Used  Substance Use Topics   Alcohol use: Not Currently   Drug use: Not Currently    Types: Marijuana     Allergies   Patient has no known allergies.   Review of Systems Review of Systems  Constitutional:  Negative for chills and fever.  HENT:  Negative for ear pain and sore throat.   Eyes:  Negative  for pain and visual disturbance.  Respiratory:  Negative for cough and shortness of breath.   Cardiovascular:  Negative for chest pain and palpitations.  Gastrointestinal:  Negative for abdominal pain and vomiting.  Genitourinary:  Positive for dysuria, frequency, pelvic pain, urgency and vaginal discharge (Thin, [name]). Negative for hematuria and vaginal pain.  Musculoskeletal:  Negative for arthralgias and back pain.  Skin:  Negative for color change and rash.  Neurological:  Negative for seizures and syncope.  All other systems reviewed and are negative.    Physical Exam Triage Vital Signs ED Triage Vitals [06/29/23 1107]  Encounter Vitals Group     BP      Systolic BP Percentile      Diastolic BP Percentile      Pulse      Resp      Temp      Temp src      SpO2      Weight      Height      Head Circumference      Peak Flow      Pain Score 9     Pain Loc      Pain Education      Exclude from Growth Chart    No data found.  Updated Vital Signs LMP 06/11/2023   Visual Acuity Right Eye Distance:   Left Eye Distance:   Bilateral Distance:    Right Eye Near:   Left Eye Near:    Bilateral Near:     Physical Exam Vitals and nursing note reviewed.  Constitutional:      General: She is not in acute distress.    Appearance: She is well-developed.  HENT:     Head: Normocephalic and atraumatic.  Eyes:     Conjunctiva/sclera: Conjunctivae normal.  Cardiovascular:     Rate and Rhythm: Normal rate and regular rhythm.     Heart sounds: No murmur heard. Pulmonary:     Effort: Pulmonary effort is normal. No respiratory distress.     Breath sounds: Normal breath sounds.  Abdominal:     Palpations: Abdomen is soft.     Tenderness: There is abdominal tenderness in the suprapubic area. There is no right CVA tenderness or left CVA tenderness.  Musculoskeletal:        General: No swelling.     Cervical back: Neck supple.  Skin:    General: Skin is warm and dry.      Capillary Refill: Capillary refill takes less than 2 seconds.  Neurological:  Mental Status: She is alert.  Psychiatric:        Mood and Affect: Mood normal.      UC Treatments / Results  Labs (all labs ordered are listed, but only abnormal results are displayed) Labs Reviewed  POCT URINALYSIS DIP (MANUAL ENTRY) - Abnormal; Notable for the following components:      Result Value   Color, UA orange (*)    Glucose, UA =100 (*)    Bilirubin, UA small (*)    Ketones, POC UA trace (5) (*)    Blood, UA trace-intact (*)    Protein Ur, POC =30 (*)    Nitrite, UA Positive (*)    Leukocytes, UA Large (3+) (*)    All other components within normal limits  CERVICOVAGINAL ANCILLARY ONLY    EKG   Radiology No results found.  Procedures Procedures (including critical care time)  Medications Ordered in UC Medications - No data to display  Initial Impression / Assessment and Plan / UC Course  I have reviewed the triage vital signs and the nursing notes.  Pertinent labs & imaging results that were available during my care of the patient were reviewed by me and considered in my medical decision making (see chart for details).     Acute cystitis with hematuria  Vaginal discharge   Urinalysis done today shows [name] urinary tract infection.  We will treat this with the following: Macrobid 100 mg twice daily for 5 days. This is an antibiotic.  Drink plenty of water to stay hydrated.  Avoid excessive use of caffeinated products Return to urgent care or PCP if symptoms worsen or fail to resolve.    For the vaginal discharge, Screening swab done today and results will be available in 24-48 hours. We will contact you if we need to arrange additional treatment based on your testing. Negative results will be on your MyChart account.  Final Clinical Impressions(s) / UC Diagnoses   Final diagnoses:  Acute cystitis with hematuria  Vaginal discharge     Discharge Instructions       Urinalysis done today shows [name] urinary tract infection.  We will treat this with the following: Macrobid 100 mg twice daily for 5 days. This is an antibiotic.  Drink plenty of water to stay hydrated.  Avoid excessive use of caffeinated products Return to urgent care or PCP if symptoms worsen or fail to resolve.    For the vaginal discharge, Screening swab done today and results will be available in 24-48 hours. We will contact you if we need to arrange additional treatment based on your testing. Negative results will be on your MyChart account.   ED Prescriptions     Medication Sig Dispense Auth. Provider   nitrofurantoin, macrocrystal-monohydrate, (MACROBID) 100 MG capsule Take 1 capsule (100 mg total) by mouth 2 (two) times daily. 10 capsule Kreg Pesa, [place]      PDMP not reviewed this encounter.   Morene, Cecilio, PA-C 06/29/23 1149

## 2023-06-29 NOTE — Discharge Instructions (Addendum)
 Urinalysis done today shows a urinary tract infection.  We will treat this with the following: Macrobid 100 mg twice daily for 5 days. This is an antibiotic.  Drink plenty of water to stay hydrated.  Avoid excessive use of caffeinated products Return to urgent care or PCP if symptoms worsen or fail to resolve.    For the vaginal discharge, Screening swab done today and results will be available in 24-48 hours. We will contact you if we need to arrange additional treatment based on your testing. Negative results will be on your MyChart account.

## 2023-06-30 LAB — URINE CULTURE: Culture: NO GROWTH

## 2023-07-01 ENCOUNTER — Ambulatory Visit (HOSPITAL_COMMUNITY): Payer: Self-pay

## 2023-07-01 LAB — CERVICOVAGINAL ANCILLARY ONLY
Bacterial Vaginitis (gardnerella): POSITIVE — AB
Candida Glabrata: NEGATIVE
Candida Vaginitis: NEGATIVE
Chlamydia: NEGATIVE
Comment: NEGATIVE
Comment: NEGATIVE
Comment: NEGATIVE
Comment: NEGATIVE
Comment: NEGATIVE
Comment: NORMAL
Neisseria Gonorrhea: NEGATIVE
Trichomonas: NEGATIVE

## 2023-07-02 MED ORDER — METRONIDAZOLE 500 MG PO TABS: 500.0000 mg | ORAL_TABLET | Freq: Two times a day (BID) | ORAL | 0 refills | Status: AC

## 2023-07-02 MED ORDER — FLUCONAZOLE 150 MG PO TABS: 150.0000 mg | ORAL_TABLET | Freq: Once | ORAL | 0 refills | Status: AC

## 2023-08-20 ENCOUNTER — Encounter: Payer: Self-pay | Admitting: Emergency Medicine

## 2023-08-20 ENCOUNTER — Ambulatory Visit
Admission: EM | Admit: 2023-08-20 | Discharge: 2023-08-20 | Disposition: A | Discharge status: Home / Self Care | Payer: MEDICAID | Attending: Emergency Medicine | Admitting: Emergency Medicine

## 2023-08-20 DIAGNOSIS — R350 Frequency of micturition: Secondary | ICD-10-CM | POA: Insufficient documentation

## 2023-08-20 DIAGNOSIS — Z113 Encounter for screening for infections with a predominantly sexual mode of transmission: Secondary | ICD-10-CM | POA: Insufficient documentation

## 2023-08-20 DIAGNOSIS — N898 Other specified noninflammatory disorders of vagina: Secondary | ICD-10-CM | POA: Diagnosis present

## 2023-08-20 LAB — POCT URINALYSIS DIP (MANUAL ENTRY)
Bilirubin, UA: NEGATIVE
Glucose, UA: NEGATIVE mg/dL
Ketones, POC UA: NEGATIVE mg/dL
Leukocytes, UA: NEGATIVE
Nitrite, UA: NEGATIVE
Protein Ur, POC: 30 mg/dL — AB
Spec Grav, UA: >=1.03 — AB (ref 1.010–1.025)
Urobilinogen, UA: 0.2 U/dL
pH, UA: 5.5 (ref 5.0–8.0)

## 2023-08-20 LAB — POCT URINE PREGNANCY: Preg Test, Ur: NEGATIVE

## 2023-08-20 MED ORDER — FLUCONAZOLE 150 MG PO TABS: 150.0000 mg | ORAL_TABLET | Freq: Once | ORAL | 0 refills | Status: AC | PRN

## 2023-08-20 MED ORDER — METRONIDAZOLE 500 MG PO TABS: 500.0000 mg | ORAL_TABLET | Freq: Two times a day (BID) | ORAL | 0 refills | Status: AC

## 2023-08-20 NOTE — ED Provider Notes (Signed)
 [facility]    CSN: [id number] Arrival date & time: 08/20/23  1609      History   Chief Complaint Chief Complaint  Patient presents with   Vaginal Discharge   Urinary Frequency   Abdominal Pain    HPI Suzanne [name] is a 38 y.o. female.  Here with 2 day history of lower abdominal pain, urinary frequency, and vaginal discharge with slight odor and itch. Pain is rated 7/10 currently BV in the past with similar symptoms  No nausea/vomiting Denies dysuria or hematuria No fever or flank pain.  Would like STD testing   She is on her menstrual cycle starting today   Seen about 2 months ago for UTI symptoms; UA with all signs of infection however negative culture.  Also had BV at that visit   Past Medical History:  Diagnosis Date   Bacterial infection    Brain cyst    Depression    Dysplasia of cervix, low grade (CIN 1)    ETOH abuse    GERD (gastroesophageal reflux disease)    Heart murmur    History of chicken pox    HPV (human papilloma virus) anogenital infection    LGSIL (low grade squamous intraepithelial dysplasia)    UTI (urinary tract infection)    Vaginal trichomoniasis     Patient Active Problem List   Diagnosis Date Noted   HNP (herniated nucleus pulposus), lumbar 03/16/2021   PID (acute pelvic inflammatory disease) 06/15/2019   Depression, major, recurrent, moderate (HCC) 07/08/2014   Alcohol use disorder, severe, dependence (HCC) 07/07/2014    Past Surgical History:  Procedure Laterality Date   APPENDECTOMY     CESAREAN SECTION  2008   LAPAROSCOPIC APPENDECTOMY N/A 06/15/2019   Procedure: APPENDECTOMY LAPAROSCOPIC;  Surgeon: Teresa Lonni HERO, MD;  Location: [facility];  Service: General;  Laterality: N/A;   LUMBAR LAMINECTOMY/DECOMPRESSION MICRODISCECTOMY Right 03/16/2021   Procedure: Microlumbar decompression Lumbar Five-Sacral One Right;  Surgeon: Duwayne Purchase, MD;  Location: [facility];  Service: Orthopedics;  Laterality: Right;   3 C-Bed   SKIN GRAFT      OB History     Gravida  2   Para  1   Term      Preterm      AB      Living         SAB      IAB      Ectopic      Multiple      Live Births               Home Medications    Prior to Admission medications   Medication Sig Start Date End Date Taking? Authorizing Provider  fluconazole  (DIFLUCAN ) 150 MG tablet Take 1 tablet (150 mg total) by mouth once as needed for up to 2 doses (take one pill on day 1, and the second pill 3 days later). 08/20/23  Yes Valleri Hendricksen, [place], PA-C  metroNIDAZOLE  (FLAGYL ) 500 MG tablet Take 1 tablet (500 mg total) by mouth 2 (two) times daily for 7 days. 08/20/23 08/27/23 Yes Stanley Helmuth, [place], PA-C  docusate sodium  (COLACE) 100 MG capsule Take 1 capsule (100 mg total) by mouth 2 (two) times daily as needed for mild constipation. 03/16/21   Duwayne Purchase, MD  gabapentin  (NEURONTIN ) 300 MG capsule Take 300 mg by mouth at bedtime. As needed    [provider]  ibuprofen  (ADVIL ) 600 MG tablet Take 1 tablet (600 mg total) by mouth every  8 (eight) hours as needed. 08/13/22   Saunders Shona CROME, PA-C  ondansetron  (ZOFRAN ) 4 MG tablet Take 4 mg by mouth every 8 (eight) hours as needed for nausea or vomiting.    [provider]  polyethylene glycol (MIRALAX  / GLYCOLAX ) 17 g packet Take 17 g by mouth daily. 03/16/21   Duwayne Purchase, MD    Family History Family History  Problem Relation Age of Onset   Diabetes Mother    Cancer Mother     Social History Social History   Tobacco Use   Smoking status: Every Day    Current packs/day: 0.50    Types: Cigarettes    Passive exposure: Current   Smokeless tobacco: Never  Vaping Use   Vaping status: Never Used  Substance Use Topics   Alcohol use: Not Currently   Drug use: Not Currently    Types: Marijuana     Allergies   Patient has no known allergies.   Review of Systems Review of Systems As per HPI  Physical Exam Triage Vital Signs ED Triage  Vitals  Encounter Vitals Group     BP 08/20/23 1623 132/74     Girls Systolic BP Percentile --      Girls Diastolic BP Percentile --      Boys Systolic BP Percentile --      Boys Diastolic BP Percentile --      Pulse Rate 08/20/23 1623 60     Resp 08/20/23 1623 16     Temp 08/20/23 1623 97.9 F (36.6 C)     Temp Source 08/20/23 1623 Oral     SpO2 08/20/23 1623 97 %     Weight 08/20/23 1622 99 lb 13.9 oz (45.3 kg)     Height --      Head Circumference --      Peak Flow --      Pain Score 08/20/23 1621 7     Pain Loc --      Pain Education --      Exclude from Growth Chart --    No data found.  Updated Vital Signs BP 132/74 (BP Location: Left Arm)   Pulse 60   Temp 97.9 F (36.6 C) (Oral)   Resp 16   Wt 99 lb 13.9 oz (45.3 kg)   LMP 08/20/2023 (Exact Date)   SpO2 97%   BMI 18.87 kg/m    Physical Exam Vitals and nursing note reviewed.  Constitutional:      General: She is not in acute distress.    Appearance: Normal appearance. She is not ill-appearing.  HENT:     Mouth/Throat:     Mouth: Mucous membranes are moist.     Pharynx: Oropharynx is clear.  Eyes:     Conjunctiva/sclera: Conjunctivae normal.  Cardiovascular:     Rate and Rhythm: Normal rate and regular rhythm.     Heart sounds: Normal heart sounds.  Pulmonary:     Effort: Pulmonary effort is normal. No respiratory distress.     Breath sounds: Normal breath sounds.  Abdominal:     General: Bowel sounds are normal.     Palpations: Abdomen is soft.     Tenderness: There is no abdominal tenderness. There is no right CVA tenderness, left CVA tenderness, guarding or rebound.  Musculoskeletal:        General: Normal range of motion.  Skin:    General: Skin is warm and dry.  Neurological:     Mental Status: She is alert and  oriented to person, place, and time.      UC Treatments / Results  Labs (all labs ordered are listed, but only abnormal results are displayed) Labs Reviewed  POCT URINALYSIS  DIP (MANUAL ENTRY) - Abnormal; Notable for the following components:      Result Value   Spec Grav, UA >=1.030 (*)    Blood, UA large (*)    Protein Ur, POC =30 (*)    All other components within normal limits  POCT URINE PREGNANCY - Normal  URINE CULTURE  CERVICOVAGINAL ANCILLARY ONLY    EKG  Radiology No results found.  Procedures Procedures   Medications Ordered in UC Medications - No data to display  Initial Impression / Assessment and Plan / UC Course  I have reviewed the triage vital signs and the nursing notes.  Pertinent labs & imaging results that were available during my care of the patient were reviewed by me and considered in my medical decision making (see chart for details).   UPT negative UA with blood, likely vaginal but will culture History of BV with similar symptoms. Cyto swab pending. In the meantime treat for BV with Flagyl  twice daily for 7 days.  Patient also with history of antibiotic associated yeast infection.  Sent 2 dose fluconazole  Advised return and ED precautions Agrees to plan, no questions  Final Clinical Impressions(s) / UC Diagnoses   Final diagnoses:  Urinary frequency  Vaginal discharge  Screen for STD (sexually transmitted disease)     Discharge Instructions      We will call you if anything on your swab returns positive. You can also see these results on MyChart. Please abstain from sexual intercourse until your results return.  In the meantime I am treating you for BV with Flagyl . I have also sent fluconazole  to prevent yeast infection  The urine culture is also pending, and we will contact you if treatment is needed!     ED Prescriptions     Medication Sig Dispense Auth. Provider   metroNIDAZOLE  (FLAGYL ) 500 MG tablet Take 1 tablet (500 mg total) by mouth 2 (two) times daily for 7 days. 14 tablet Malayjah Otoole, PA-C   fluconazole  (DIFLUCAN ) 150 MG tablet Take 1 tablet (150 mg total) by mouth once as needed for up to  2 doses (take one pill on day 1, and the second pill 3 days later). 2 tablet Renisha Cockrum, [place], PA-C      PDMP not reviewed this encounter.   Pryor Guettler, PA-C 08/20/23 [id number]

## 2023-08-20 NOTE — ED Triage Notes (Signed)
 Pt presents c/o abdominal pain, frequent urination, and vaginal discharge x 2 days.

## 2023-08-20 NOTE — Discharge Instructions (Addendum)
 We will call you if anything on your swab returns positive. You can also see these results on MyChart. Please abstain from sexual intercourse until your results return.  In the meantime I am treating you for BV with Flagyl . I have also sent fluconazole  to prevent yeast infection  The urine culture is also pending, and we will contact you if treatment is needed!

## 2023-08-21 LAB — URINE CULTURE: Culture: NO GROWTH

## 2023-08-21 LAB — CERVICOVAGINAL ANCILLARY ONLY
Chlamydia: NEGATIVE
Comment: NEGATIVE
Comment: NEGATIVE
Comment: NORMAL
Neisseria Gonorrhea: NEGATIVE
Trichomonas: NEGATIVE

## 2023-08-22 ENCOUNTER — Ambulatory Visit (HOSPITAL_COMMUNITY): Payer: Self-pay

## 2023-09-07 ENCOUNTER — Telehealth: Payer: MEDICAID

## 2023-09-07 DIAGNOSIS — N760 Acute vaginitis: Secondary | ICD-10-CM

## 2023-09-07 DIAGNOSIS — B9689 Other specified bacterial agents as the cause of diseases classified elsewhere: Secondary | ICD-10-CM

## 2023-09-08 MED ORDER — METRONIDAZOLE 500 MG PO TABS: 500.0000 mg | ORAL_TABLET | Freq: Two times a day (BID) | ORAL | 0 refills | Status: AC

## 2023-09-08 NOTE — Progress Notes (Signed)

## 2024-03-03 ENCOUNTER — Ambulatory Visit: Payer: MEDICAID
